# Patient Record
Sex: Female | Born: 1995 | Hispanic: No | Marital: Single | Smoking: Current some day smoker
Health system: Southern US, Community
[De-identification: ages and names within clinical notes are randomized; demographics above are authoritative.]

## PROBLEM LIST (undated history)

## (undated) ENCOUNTER — Inpatient Hospital Stay (HOSPITAL_COMMUNITY): Payer: Self-pay

## (undated) DIAGNOSIS — A6000 Herpesviral infection of urogenital system, unspecified: Secondary | ICD-10-CM

## (undated) DIAGNOSIS — B9689 Other specified bacterial agents as the cause of diseases classified elsewhere: Secondary | ICD-10-CM

## (undated) DIAGNOSIS — N760 Acute vaginitis: Secondary | ICD-10-CM

## (undated) DIAGNOSIS — F319 Bipolar disorder, unspecified: Secondary | ICD-10-CM

## (undated) DIAGNOSIS — F419 Anxiety disorder, unspecified: Secondary | ICD-10-CM

## (undated) DIAGNOSIS — A549 Gonococcal infection, unspecified: Secondary | ICD-10-CM

## (undated) DIAGNOSIS — A749 Chlamydial infection, unspecified: Secondary | ICD-10-CM

## (undated) DIAGNOSIS — D649 Anemia, unspecified: Secondary | ICD-10-CM

## (undated) DIAGNOSIS — F1910 Other psychoactive substance abuse, uncomplicated: Secondary | ICD-10-CM

## (undated) HISTORY — PX: TUBAL LIGATION: SHX77

## (undated) HISTORY — PX: MOUTH SURGERY: SHX715

---
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
## 2015-02-23 ENCOUNTER — Ambulatory Visit: Payer: No Typology Code available for payment source | Admitting: Certified Nurse Midwife

## 2015-02-24 ENCOUNTER — Encounter (HOSPITAL_COMMUNITY): Payer: Self-pay | Admitting: *Deleted

## 2015-02-24 ENCOUNTER — Emergency Department (HOSPITAL_COMMUNITY)
Admission: EM | Admit: 2015-02-24 | Discharge: 2015-02-24 | Disposition: A | Discharge status: Home / Self Care | Payer: BLUE CROSS/BLUE SHIELD | Attending: Emergency Medicine | Admitting: Emergency Medicine

## 2015-02-24 DIAGNOSIS — Z7952 Long term (current) use of systemic steroids: Secondary | ICD-10-CM | POA: Insufficient documentation

## 2015-02-24 DIAGNOSIS — F419 Anxiety disorder, unspecified: Secondary | ICD-10-CM | POA: Diagnosis not present

## 2015-02-24 DIAGNOSIS — A6000 Herpesviral infection of urogenital system, unspecified: Secondary | ICD-10-CM

## 2015-02-24 DIAGNOSIS — F17210 Nicotine dependence, cigarettes, uncomplicated: Secondary | ICD-10-CM | POA: Diagnosis not present

## 2015-02-24 DIAGNOSIS — R21 Rash and other nonspecific skin eruption: Secondary | ICD-10-CM | POA: Diagnosis present

## 2015-02-24 MED ORDER — ACYCLOVIR 400 MG PO TABS
400.0000 mg | ORAL_TABLET | Freq: Four times a day (QID) | ORAL | Status: DC
Start: 2015-02-24 — End: 2015-07-13

## 2015-02-24 MED ORDER — ACYCLOVIR 200 MG PO CAPS
400.0000 mg | ORAL_CAPSULE | Freq: Once | ORAL | Status: AC
  Administered 2015-02-24: 400 mg via ORAL
  Filled 2015-02-24 (×3): qty 2

## 2015-02-24 NOTE — Discharge Instructions (Signed)

## 2015-02-24 NOTE — ED Provider Notes (Signed)
CSN: [id number]     Arrival date & time 02/24/15  1227 History   First MD Initiated Contact with Patient 02/24/15 1425     Chief Complaint  Patient presents with  . SEXUALLY TRANSMITTED DISEASE  . Rash     (Consider location/radiation/quality/duration/timing/severity/associated sxs/prior Treatment) HPI   19 year old female with history of bipolar and anxiety who presents for evaluation of a rash. Patient report she was seen 3 days ago for evaluation vaginal discharge and rash. She was treated for suspected gonorrhea and Chlamydia. She was also diagnosed with bacterial vaginosis and has been taking Flagyl. Her rash was tested for herpes but the result is not been confirmed yet. She continued to endorse burning itching sensation at her vaginal rash and would like treatment for it. She has had prior history of gonorrhea and Chlamydia. She has had 3 separate sexual partners within the past 6 months using protection on occasion. Her last menstrual period was a week ago. She denies having fever, nausea vomiting, abdominal pain, dysuria. Her vaginal discharge has improved.    Past Medical History  Diagnosis Date  . Anxiety   . Bipolar 1 disorder (HCC)    History reviewed. No pertinent past surgical history. Family History  Problem Relation Age of Onset  . Diabetes Maternal Grandfather   . Heart disease Maternal Grandfather    Social History  Substance Use Topics  . Smoking status: Current Every Day Smoker    Types: Cigars  . Smokeless tobacco: Never Used  . Alcohol Use: Yes     Comment: occasionally   OB History    Gravida Para Term Preterm AB TAB SAB Ectopic Multiple Living       Review of Systems  All other systems reviewed and are negative.     Allergies  Review of patient's allergies indicates no known allergies.  Home Medications   Prior to Admission medications   Medication Sig Start Date End Date Taking? Authorizing Provider  cyclobenzaprine  (FLEXERIL) 10 MG tablet Take 1 tablet (10 mg total) by mouth 3 (three) times daily as needed for muscle spasms. 04/24/14   Mercedes Camprubi-Soms, PA-C  HYDROcodone-acetaminophen (NORCO) 5-325 MG per tablet Take 1 tablet by mouth every 6 (six) hours as needed for severe pain. 04/24/14   Mercedes Camprubi-Soms, PA-C  LORazepam (ATIVAN) 1 MG tablet Take 1 tablet (1 mg total) by mouth 3 (three) times daily as needed (anxiety / muscle spasm). 04/01/14   Gerhard Munch, MD  medroxyPROGESTERone (DEPO-PROVERA) 150 MG/ML injection Inject 1 mL (150 mg total) into the muscle every 3 (three) months. 02/28/14   Antionette Char, MD  naproxen (NAPROSYN) 500 MG tablet Take 1 tablet (500 mg total) by mouth 2 (two) times daily as needed for mild pain, moderate pain or headache (TAKE WITH MEALS.). 04/24/14   Mercedes Camprubi-Soms, PA-C  predniSONE (DELTASONE) 10 MG tablet Take 2 tablets (20 mg total) by mouth daily. 07/06/14   Earley Favor, NP   BP 145/99 mmHg  Pulse 92  Temp(Src) 98.7 F (37.1 C) (Oral)  Resp 18  SpO2 98%  LMP 02/17/2015 Physical Exam  Constitutional: She appears well-developed and well-nourished. No distress.  HENT:  Head: Atraumatic.  Eyes: Conjunctivae are normal.  Neck: Neck supple.  Abdominal: Soft. There is no tenderness.  Genitourinary:  Chaperone present during exam. A grouped vesicles noted to right lower external vaginal region consistent with genital herpes.  No inguinal lymphadenopathy or inguinal hernia noted.  Normal vaginal vault with mild vaginal discharge.  Closed cervical os.   Neurological: She is alert.  Skin: No rash noted.  Psychiatric: She has a normal mood and affect.  Nursing note and vitals reviewed.   ED Course  Procedures (including critical care time)  3:03 PM Pt with genital rash consistent with genital herpes.  Will treat with acyclovir.  Safe sex practice discussed. Pt also has been evaluated recently for STD therefore, additional STd testing not  indicated at this time.    MDM   Final diagnoses:  Genital herpes    BP 145/99 mmHg  Pulse 92  Temp(Src) 98.7 F (37.1 C) (Oral)  Resp 18  SpO2 98%  LMP 02/17/2015     Fayrene HelperBowie Roby Spalla, PA-C 02/24/15 1504  Alvira MondayErin Schlossman, MD 02/26/15 2245

## 2015-02-24 NOTE — ED Notes (Signed)
Pt reports recent treatment for std, still has rash and wants to be checked for herpes.

## 2015-03-24 ENCOUNTER — Emergency Department (HOSPITAL_COMMUNITY)
Admission: EM | Admit: 2015-03-24 | Discharge: 2015-03-24 | Disposition: A | Discharge status: Home / Self Care | Payer: BLUE CROSS/BLUE SHIELD | Attending: Emergency Medicine | Admitting: Emergency Medicine

## 2015-03-24 ENCOUNTER — Encounter (HOSPITAL_COMMUNITY): Payer: Self-pay | Admitting: Neurology

## 2015-03-24 DIAGNOSIS — Z3202 Encounter for pregnancy test, result negative: Secondary | ICD-10-CM | POA: Diagnosis not present

## 2015-03-24 DIAGNOSIS — F419 Anxiety disorder, unspecified: Secondary | ICD-10-CM | POA: Insufficient documentation

## 2015-03-24 DIAGNOSIS — N939 Abnormal uterine and vaginal bleeding, unspecified: Secondary | ICD-10-CM | POA: Insufficient documentation

## 2015-03-24 DIAGNOSIS — Z7952 Long term (current) use of systemic steroids: Secondary | ICD-10-CM | POA: Insufficient documentation

## 2015-03-24 DIAGNOSIS — Z79899 Other long term (current) drug therapy: Secondary | ICD-10-CM | POA: Diagnosis not present

## 2015-03-24 DIAGNOSIS — Z8619 Personal history of other infectious and parasitic diseases: Secondary | ICD-10-CM | POA: Diagnosis not present

## 2015-03-24 DIAGNOSIS — F319 Bipolar disorder, unspecified: Secondary | ICD-10-CM | POA: Diagnosis not present

## 2015-03-24 DIAGNOSIS — F17210 Nicotine dependence, cigarettes, uncomplicated: Secondary | ICD-10-CM | POA: Insufficient documentation

## 2015-03-24 LAB — CBC WITH DIFFERENTIAL/PLATELET
BASOS PCT: 0 %
Basophils Absolute: 0 10*3/uL (ref 0.0–0.1)
Eosinophils Absolute: 0.1 10*3/uL (ref 0.0–0.7)
Eosinophils Relative: 1 %
HEMATOCRIT: 39.1 % (ref 36.0–46.0)
Hemoglobin: 13.1 g/dL (ref 12.0–15.0)
LYMPHS PCT: 35 %
Lymphs Abs: 2.2 10*3/uL (ref 0.7–4.0)
MCH: 29.6 pg (ref 26.0–34.0)
MCHC: 33.5 g/dL (ref 30.0–36.0)
MCV: 88.3 fL (ref 78.0–100.0)
MONO ABS: 0.3 10*3/uL (ref 0.1–1.0)
Monocytes Relative: 4 %
NEUTROS PCT: 60 %
Neutro Abs: 3.7 10*3/uL (ref 1.7–7.7)
Platelets: 187 10*3/uL (ref 150–400)
RBC: 4.43 MIL/uL (ref 3.87–5.11)
RDW: 13.4 % (ref 11.5–15.5)
WBC: 6.2 10*3/uL (ref 4.0–10.5)

## 2015-03-24 LAB — I-STAT BETA HCG BLOOD, ED (MC, WL, AP ONLY): I-stat hCG, quantitative: <5 m[IU]/mL (ref <5)

## 2015-03-24 MED ORDER — IBUPROFEN 200 MG PO TABS
600.0000 mg | ORAL_TABLET | Freq: Once | ORAL | Status: DC
  Filled 2015-03-24: qty 1

## 2015-03-24 NOTE — ED Provider Notes (Signed)
CSN: [id number]     Arrival date & time 03/24/15  1605 History   First MD Initiated Contact with Patient 03/24/15 1616     Chief Complaint  Patient presents with  . Vaginal Bleeding     (Consider location/radiation/quality/duration/timing/severity/associated sxs/prior Treatment) HPI  Patient is a [age] year old female with past medical history significant for bipolar, anxiety, genital herpes, who presents to the emergency department with vaginal bleeding. That she has been on a form of birth control for the past 4 years, over the past year has been receiving Depo shots. She stopped using depo shots last November and has not had a menstrual period since that time. Reports vaginal bleeding started 1 month ago and has constant spotting since that time. Using approximately 1-2 tampons a day. Regular menstrual periods prior to contraceptive use. Recently treated for gonorrhea/Chlamydia and genital herpes. Reports lower abdominal cramping over the past month. Associated with nausea. Denies fevers, chills, vomiting, dysuria, urinary urgency, vaginal discharge, rash.  No family history of bleeding disorder. No prior surgeries. Denies chest pain, fatigue, shortness of breath, lightheadedness.   Past Medical History  Diagnosis Date  . Anxiety   . Bipolar 1 disorder (HCC)    History reviewed. No pertinent past surgical history. Family History  Problem Relation Age of Onset  . Diabetes Maternal Grandfather   . Heart disease Maternal Grandfather    Social History  Substance Use Topics  . Smoking status: Current Every Day Smoker    Types: Cigars  . Smokeless tobacco: Never Used  . Alcohol Use: Yes     Comment: occasionally   OB History    Gravida Para Term Preterm AB TAB SAB Ectopic Multiple Living   0 0 0 0 0 0 0 0 0 0      Review of Systems  Constitutional: Negative for fever.  HENT: Negative for congestion.   Respiratory: Negative for cough, chest tightness and shortness of breath.    Cardiovascular: Negative for chest pain.  Gastrointestinal: Negative for vomiting, abdominal pain and blood in stool.  Genitourinary: Positive for vaginal bleeding, menstrual problem and pelvic pain. Negative for dysuria, hematuria, flank pain, vaginal discharge and vaginal pain.  Musculoskeletal: Negative for back pain.  Skin: Negative for rash.  Neurological: Negative for dizziness, weakness and light-headedness.  Psychiatric/Behavioral: Negative for behavioral problems.      Allergies  Review of patient's allergies indicates no known allergies.  Home Medications   Prior to Admission medications   Medication Sig Start Date End Date Taking? Authorizing Provider  acyclovir (ZOVIRAX) 400 MG tablet Take 1 tablet (400 mg total) by mouth 4 (four) times daily. 02/24/15   Fayrene HelperBowie Tran, PA-C  cyclobenzaprine (FLEXERIL) 10 MG tablet Take 1 tablet (10 mg total) by mouth 3 (three) times daily as needed for muscle spasms. 04/24/14   Mercedes Camprubi-Soms, PA-C  HYDROcodone-acetaminophen (NORCO) 5-325 MG per tablet Take 1 tablet by mouth every 6 (six) hours as needed for severe pain. 04/24/14   Mercedes Camprubi-Soms, PA-C  LORazepam (ATIVAN) 1 MG tablet Take 1 tablet (1 mg total) by mouth 3 (three) times daily as needed (anxiety / muscle spasm). 04/01/14   Gerhard Munchobert Lockwood, MD  medroxyPROGESTERone (DEPO-PROVERA) 150 MG/ML injection Inject 1 mL (150 mg total) into the muscle every 3 (three) months. 02/28/14   Antionette CharLisa Jackson-Moore, MD  naproxen (NAPROSYN) 500 MG tablet Take 1 tablet (500 mg total) by mouth 2 (two) times daily as needed for mild pain, moderate pain or headache (TAKE WITH MEALS.). 04/24/14  Mercedes Camprubi-Soms, PA-C  predniSONE (DELTASONE) 10 MG tablet Take 2 tablets (20 mg total) by mouth daily. 07/06/14   Earley Favor, NP   BP 112/74 mmHg  Pulse 60  Temp(Src) 97.8 F (36.6 C) (Oral)  Resp 18  SpO2 100%  LMP 02/17/2015 Physical Exam  Constitutional: She is oriented to person, place,  and time. She appears well-developed and well-nourished. No distress.  HENT:  Head: Atraumatic.  Mouth/Throat: Oropharynx is clear and moist.  Eyes: Conjunctivae and EOM are normal.  Neck: Normal range of motion.  Cardiovascular: Normal rate, regular rhythm, normal heart sounds and intact distal pulses.   Pulmonary/Chest: Effort normal and breath sounds normal. No respiratory distress.  Abdominal: There is no tenderness. There is no rebound.  Genitourinary: Uterus normal. There is no rash on the right labia. There is no rash on the left labia. Cervix exhibits no motion tenderness and no discharge. Right adnexum displays no mass and no tenderness. Left adnexum displays no mass and no tenderness. There is bleeding in the vagina. No tenderness in the vagina. No foreign body around the vagina. No signs of injury around the vagina. No vaginal discharge found.  Musculoskeletal: Normal range of motion.  Neurological: She is alert and oriented to person, place, and time.  Skin: Skin is warm. No pallor.  Psychiatric: She has a normal mood and affect.    ED Course  Procedures (including critical care time) Labs Review Labs Reviewed  CBC WITH DIFFERENTIAL/PLATELET  I-STAT BETA HCG BLOOD, ED ([facility], [facility], [facility] ONLY)    Imaging Review No results found. I have personally reviewed and evaluated these images and lab results as part of my medical decision-making.   EKG Interpretation None      MDM   Final diagnoses:  Vaginal bleeding    Patient is a 19 year old female with past medical history significant for bipolar, anxiety, genital herpes, who presents to the emergency department with vaginal bleeding. No acute distress, not ill appearing. Afebrile, VSS. Exam as above, notable for benign abdominal exam, GU with scant blood in vagina vault, no foreign bodies or lacerations, no vaginal discharge, no CMT.    Hgb stable. Pregnancy test negative. Doubt coagulopathy, no family history, no other signs  of bleeding in the past. Patient with minimal amount of bleeding on exam. Given motrin for cramping. Discussed follow up with OB/GYN as an outpatient for further evaluation. Do not feel that the patient needs further imaging at this time. Most likely with dysfunctional uterine bleeding. Discussed strict return precautions to the emergency department. Patient expressed understanding, no questions or concerns at time of discharge.       Corena Herter, MD 03/25/15 1610  Linwood Dibbles, MD 03/25/15 [phone]

## 2015-03-24 NOTE — Discharge Instructions (Signed)
Take Motrin 600 mg every 6 hours as needed for abdominal cramping, this will also help with her vaginal bleeding. Call to schedule an appointment with gynecology on Monday morning. Return to the emergency department if bleeding becomes significantly worse.  Dysfunctional Uterine Bleeding Dysfunctional uterine bleeding is abnormal bleeding from the uterus. Dysfunctional uterine bleeding includes:  A period that comes earlier or later than usual.  A period that is lighter, heavier, or has blood clots.  Bleeding between periods.  Skipping one or more periods.  Bleeding after sexual intercourse.  Bleeding after menopause. HOME CARE INSTRUCTIONS  Pay attention to any changes in your symptoms. Follow these instructions to help with your condition: Eating  Eat well-balanced meals. Include foods that are high in iron, such as liver, meat, shellfish, green leafy vegetables, and eggs.  If you become constipated:  Drink plenty of water.  Eat fruits and vegetables that are high in water and fiber, such as spinach, carrots, raspberries, apples, and mango. Medicines  Take over-the-counter and prescription medicines only as told by your health care provider.  Do not change medicines without talking with your health care provider.  Aspirin or medicines that contain aspirin may make the bleeding worse. Do not take those medicines:  During the week before your period.  During your period.  If you were prescribed iron pills, take them as told by your health care provider. Iron pills help to replace iron that your body loses because of this condition. Activity  If you need to change your sanitary pad or tampon more than one time every 2 hours:  Lie in bed with your feet raised (elevated).  Place a cold pack on your lower abdomen.  Rest as much as possible until the bleeding stops or slows down.  Do not try to lose weight until the bleeding has stopped and your blood iron level is back  to normal. Other Instructions  For two months, write down:  When your period starts.  When your period ends.  When any abnormal bleeding occurs.  What problems you notice.  Keep all follow up visits as told by your health care provider. This is important. SEEK MEDICAL CARE IF:  You get light-headed or weak.  You have nausea and vomiting.  You cannot eat or drink without vomiting.  You feel dizzy or have diarrhea while you are taking medicines.  You are taking birth control pills or hormones, and you want to change them or stop taking them. SEEK IMMEDIATE MEDICAL CARE IF:  You develop a fever or chills.  You need to change your sanitary pad or tampon more than one time per hour.  Your bleeding becomes heavier, or your flow contains clots more often.  You develop pain in your abdomen.  You lose consciousness.  You develop a rash.   This information is not intended to replace advice given to you by your health care provider. Make sure you discuss any questions you have with your health care provider.   Document Released: 03/28/2000 Document Revised: 12/20/2014 Document Reviewed: 06/26/2014 Elsevier Interactive Patient Education [employer]2016 [employer]

## 2015-03-24 NOTE — ED Notes (Signed)
Pt comfortable with discharge and follow up instructions. Pt declines wheelchair, escorted to waiting area by this RN. No Prescriptions 

## 2015-03-24 NOTE — ED Notes (Signed)
Pt reports 1 month of vaginal bleeding. Reports dark red blood, small clots. Bleeding has been intermittent but is heavy now. Color has changed from brown to dark red. 1 month ago was dx with gonorrhea, herpes, bv.

## 2015-04-20 ENCOUNTER — Encounter: Payer: BLUE CROSS/BLUE SHIELD | Admitting: Obstetrics & Gynecology

## 2015-06-24 ENCOUNTER — Emergency Department (HOSPITAL_COMMUNITY)
Admission: EM | Admit: 2015-06-24 | Discharge: 2015-06-24 | Disposition: A | Discharge status: Home / Self Care | Payer: BLUE CROSS/BLUE SHIELD | Attending: Emergency Medicine | Admitting: Emergency Medicine

## 2015-06-24 ENCOUNTER — Encounter (HOSPITAL_COMMUNITY): Payer: Self-pay | Admitting: Emergency Medicine

## 2015-06-24 DIAGNOSIS — F419 Anxiety disorder, unspecified: Secondary | ICD-10-CM | POA: Diagnosis not present

## 2015-06-24 DIAGNOSIS — R1030 Lower abdominal pain, unspecified: Secondary | ICD-10-CM

## 2015-06-24 DIAGNOSIS — N949 Unspecified condition associated with female genital organs and menstrual cycle: Secondary | ICD-10-CM | POA: Insufficient documentation

## 2015-06-24 DIAGNOSIS — F319 Bipolar disorder, unspecified: Secondary | ICD-10-CM | POA: Insufficient documentation

## 2015-06-24 DIAGNOSIS — N946 Dysmenorrhea, unspecified: Secondary | ICD-10-CM

## 2015-06-24 DIAGNOSIS — N179 Acute kidney failure, unspecified: Secondary | ICD-10-CM | POA: Diagnosis not present

## 2015-06-24 DIAGNOSIS — N898 Other specified noninflammatory disorders of vagina: Secondary | ICD-10-CM | POA: Diagnosis not present

## 2015-06-24 DIAGNOSIS — Z79899 Other long term (current) drug therapy: Secondary | ICD-10-CM | POA: Insufficient documentation

## 2015-06-24 DIAGNOSIS — Z3202 Encounter for pregnancy test, result negative: Secondary | ICD-10-CM | POA: Diagnosis not present

## 2015-06-24 DIAGNOSIS — Z202 Contact with and (suspected) exposure to infections with a predominantly sexual mode of transmission: Secondary | ICD-10-CM | POA: Diagnosis not present

## 2015-06-24 DIAGNOSIS — N926 Irregular menstruation, unspecified: Secondary | ICD-10-CM | POA: Insufficient documentation

## 2015-06-24 DIAGNOSIS — Z793 Long term (current) use of hormonal contraceptives: Secondary | ICD-10-CM | POA: Diagnosis not present

## 2015-06-24 DIAGNOSIS — Z711 Person with feared health complaint in whom no diagnosis is made: Secondary | ICD-10-CM

## 2015-06-24 DIAGNOSIS — F17210 Nicotine dependence, cigarettes, uncomplicated: Secondary | ICD-10-CM | POA: Diagnosis not present

## 2015-06-24 DIAGNOSIS — R110 Nausea: Secondary | ICD-10-CM | POA: Diagnosis not present

## 2015-06-24 DIAGNOSIS — Z7251 High risk heterosexual behavior: Secondary | ICD-10-CM

## 2015-06-24 LAB — CBC
HCT: 42.3 % (ref 36.0–46.0)
Hemoglobin: 14.2 g/dL (ref 12.0–15.0)
MCH: 31.3 pg (ref 26.0–34.0)
MCHC: 33.6 g/dL (ref 30.0–36.0)
MCV: 93.2 fL (ref 78.0–100.0)
Platelets: 259 10*3/uL (ref 150–400)
RBC: 4.54 MIL/uL (ref 3.87–5.11)
RDW: 13.6 % (ref 11.5–15.5)
WBC: 8.9 10*3/uL (ref 4.0–10.5)

## 2015-06-24 LAB — COMPREHENSIVE METABOLIC PANEL
ALBUMIN: 4.6 g/dL (ref 3.5–5.0)
ALK PHOS: 68 U/L (ref 38–126)
ALT: 17 U/L (ref 14–54)
AST: 23 U/L (ref 15–41)
Anion gap: 15 (ref 5–15)
BILIRUBIN TOTAL: 0.6 mg/dL (ref 0.3–1.2)
BUN: 15 mg/dL (ref 6–20)
CALCIUM: 10.1 mg/dL (ref 8.9–10.3)
CO2: 22 mmol/L (ref 22–32)
Chloride: 103 mmol/L (ref 101–111)
Creatinine, Ser: 1.22 mg/dL — ABNORMAL HIGH (ref 0.44–1.00)
GFR calc Af Amer: >60 mL/min (ref >60)
GFR calc non Af Amer: >60 mL/min (ref >60)
GLUCOSE: 112 mg/dL — AB (ref 65–99)
Potassium: 3.9 mmol/L (ref 3.5–5.1)
Sodium: 140 mmol/L (ref 135–145)
TOTAL PROTEIN: 7.8 g/dL (ref 6.5–8.1)

## 2015-06-24 LAB — URINALYSIS, ROUTINE W REFLEX MICROSCOPIC
Bilirubin Urine: NEGATIVE
Glucose, UA: NEGATIVE mg/dL
Ketones, ur: NEGATIVE mg/dL
Nitrite: NEGATIVE
PH: 5.5 (ref 5.0–8.0)
Protein, ur: NEGATIVE mg/dL
Specific Gravity, Urine: 1.021 (ref 1.005–1.030)

## 2015-06-24 LAB — URINE MICROSCOPIC-ADD ON

## 2015-06-24 LAB — LIPASE, BLOOD: Lipase: 34 U/L (ref 11–51)

## 2015-06-24 LAB — I-STAT BETA HCG BLOOD, ED (MC, WL, AP ONLY): I-stat hCG, quantitative: <5 m[IU]/mL (ref <5)

## 2015-06-24 MED ORDER — ONDANSETRON HCL 8 MG PO TABS: 8.0000 mg | ORAL_TABLET | Freq: Three times a day (TID) | ORAL | Status: DC | PRN

## 2015-06-24 MED ORDER — CEFTRIAXONE SODIUM 250 MG IJ SOLR
250.0000 mg | Freq: Once | INTRAMUSCULAR | Status: AC
  Administered 2015-06-24: 250 mg via INTRAMUSCULAR
  Filled 2015-06-24: qty 250

## 2015-06-24 MED ORDER — LIDOCAINE HCL (PF) 1 % IJ SOLN
INTRAMUSCULAR | Status: AC
  Administered 2015-06-24: 5 mL
  Filled 2015-06-24: qty 5

## 2015-06-24 MED ORDER — METRONIDAZOLE 500 MG PO TABS
2000.0000 mg | ORAL_TABLET | Freq: Once | ORAL | Status: AC
  Administered 2015-06-24: 2000 mg via ORAL
  Filled 2015-06-24: qty 4

## 2015-06-24 MED ORDER — ONDANSETRON 4 MG PO TBDP
8.0000 mg | ORAL_TABLET | Freq: Once | ORAL | Status: AC
  Administered 2015-06-24: 8 mg via ORAL
  Filled 2015-06-24: qty 2

## 2015-06-24 MED ORDER — AZITHROMYCIN 250 MG PO TABS
1000.0000 mg | ORAL_TABLET | Freq: Once | ORAL | Status: AC
  Administered 2015-06-24: 1000 mg via ORAL
  Filled 2015-06-24: qty 4

## 2015-06-24 NOTE — ED Notes (Signed)
Pt. reports low abdominal pain with mild nausea and occasional diarrhea onset last week with mild vaginal discharge , denies fever or chills.

## 2015-06-24 NOTE — ED Provider Notes (Signed)
CSN: [id number]     Arrival date & time 06/24/15  0007 History   First MD Initiated Contact with Patient 06/24/15 [id number]     Chief Complaint  Patient presents with  . Abdominal Pain     (Consider location/radiation/quality/duration/timing/severity/associated sxs/prior Treatment) HPI Comments: Anita LeavensJulieanna M Sims is a 20 y.o. female with a PMHx of bipolar 1 and anxiety, who presents to the ED with complaints of 3 weeks of ongoing lower abdominal pain. She states that she went to the health department 3 weeks ago and was tested positive for chlamydia, received azithromycin only, but the symptoms have persisted. She describes the pain as 10/10 constant throbbing and stabbing in the lower abdomen, nonradiating, worse with lying down, and with no treatments tried prior to arrival. Associated symptoms include white malodorous vaginal discharge and nausea. She endorses alcohol use over the last 3 days, and endorses being sexually active with 294 female partners over the last year, all unprotected. Additionally she states that she is on her menstrual cycle, although she is not sure because she has very irregular menses, but states that she is having spotting and vaginal bleeding. She believes this could be from a menstrual cycle. She denies any fevers, chills, chest pain, shortness breath, vomiting, diarrhea, constipation, melena, hematochezia, obstipation, dysuria, hematuria, vaginal itching, genital lesions, numbness, tingling, or focal weakness. She denies any recent travel, sick contacts, suspicious food intake, or chronic NSAID use.  Patient is a 20 y.o. female presenting with abdominal pain. The history is provided by the patient. No language interpreter was used.  Abdominal Pain Pain location:  LLQ, RLQ and suprapubic Pain quality: stabbing and throbbing   Pain radiates to:  Does not radiate Pain severity:  Severe Onset quality:  Gradual Duration:  3 weeks Timing:  Constant Progression:   Unchanged Chronicity:  Recurrent Context: recent illness (tested +chlamydia)   Context: not recent travel, not sick contacts and not suspicious food intake   Relieved by:  None tried Worsened by:  Position changes Ineffective treatments:  None tried Associated symptoms: nausea, vaginal bleeding (on menses) and vaginal discharge   Associated symptoms: no chest pain, no chills, no constipation, no diarrhea, no dysuria, no fever, no flatus, no hematemesis, no hematochezia, no hematuria, no melena, no shortness of breath and no vomiting   Risk factors: alcohol abuse   Risk factors: no NSAID use     Past Medical History  Diagnosis Date  . Anxiety   . Bipolar 1 disorder (HCC)    History reviewed. No pertinent past surgical history. Family History  Problem Relation Age of Onset  . Diabetes Maternal Grandfather   . Heart disease Maternal Grandfather    Social History  Substance Use Topics  . Smoking status: Current Every Day Smoker    Types: Cigars  . Smokeless tobacco: Never Used  . Alcohol Use: Yes     Comment: occasionally   OB History    Gravida Para Term Preterm AB TAB SAB Ectopic Multiple Living   0 0 0 0 0 0 0 0 0 0      Review of Systems  Constitutional: Negative for fever and chills.  Respiratory: Negative for shortness of breath.   Cardiovascular: Negative for chest pain.  Gastrointestinal: Positive for nausea and abdominal pain. Negative for vomiting, diarrhea, constipation, blood in stool, melena, hematochezia, flatus and hematemesis.  Genitourinary: Positive for vaginal bleeding (on menses) and vaginal discharge. Negative for dysuria, hematuria, genital sores and vaginal pain.  Musculoskeletal: Negative for myalgias and  arthralgias.  Skin: Negative for color change.  Allergic/Immunologic: Negative for immunocompromised state.  Neurological: Negative for weakness and numbness.  Psychiatric/Behavioral: Negative for confusion.   10 Systems reviewed and are negative  for acute change except as noted in the HPI.    Allergies  Review of patient's allergies indicates no known allergies.  Home Medications   Prior to Admission medications   Medication Sig Start Date End Date Taking? Authorizing Provider  acyclovir (ZOVIRAX) 400 MG tablet Take 1 tablet (400 mg total) by mouth 4 (four) times daily. 02/24/15   Fayrene Helper, PA-C  cyclobenzaprine (FLEXERIL) 10 MG tablet Take 1 tablet (10 mg total) by mouth 3 (three) times daily as needed for muscle spasms. 04/24/14   Twisha Vanpelt Camprubi-Soms, PA-C  HYDROcodone-acetaminophen (NORCO) 5-325 MG per tablet Take 1 tablet by mouth every 6 (six) hours as needed for severe pain. 04/24/14   Cassius Cullinane Camprubi-Soms, PA-C  LORazepam (ATIVAN) 1 MG tablet Take 1 tablet (1 mg total) by mouth 3 (three) times daily as needed (anxiety / muscle spasm). 04/01/14   Gerhard Munch, MD  medroxyPROGESTERone (DEPO-PROVERA) 150 MG/ML injection Inject 1 mL (150 mg total) into the muscle every 3 (three) months. 02/28/14   Antionette Char, MD  naproxen (NAPROSYN) 500 MG tablet Take 1 tablet (500 mg total) by mouth 2 (two) times daily as needed for mild pain, moderate pain or headache (TAKE WITH MEALS.). 04/24/14   Talesha Ellithorpe Camprubi-Soms, PA-C  predniSONE (DELTASONE) 10 MG tablet Take 2 tablets (20 mg total) by mouth daily. 07/06/14   Earley Favor, NP   BP 120/71 mmHg  Pulse 73  Temp(Src) 97.8 F (36.6 C) (Oral)  Resp 18  Ht  (1.6 m)  Wt 58.627 kg  BMI 22.90 kg/m2  SpO2 100%  LMP 06/17/2015 (Approximate) Physical Exam  Constitutional: She is oriented to person, place, and time. Vital signs are normal. She appears well-developed and well-nourished.  Non-toxic appearance. No distress.  Afebrile, nontoxic, NAD  HENT:  Head: Normocephalic and atraumatic.  Mouth/Throat: Oropharynx is clear and moist and mucous membranes are normal.  Eyes: Conjunctivae and EOM are normal. Right eye exhibits no discharge. Left eye exhibits no discharge.   Neck: Normal range of motion. Neck supple.  Cardiovascular: Normal rate, regular rhythm, normal heart sounds and intact distal pulses.  Exam reveals no gallop and no friction rub.   No murmur heard. Pulmonary/Chest: Effort normal and breath sounds normal. No respiratory distress. She has no decreased breath sounds. She has no wheezes. She has no rhonchi. She has no rales.  Abdominal: Soft. Normal appearance and bowel sounds are normal. She exhibits no distension. There is tenderness (pubic symphysis). There is no rigidity, no rebound, no guarding, no CVA tenderness, no tenderness at McBurney's point and negative Murphy's sign.    Soft, nondistended, +BS throughout, with no focal tenderness in the abdomen but some mild tenderness over the pubic symphysis, no r/g/r, neg murphy's, neg mcburney's, no CVA TTP   Genitourinary:  Refused pelvic exam  Musculoskeletal: Normal range of motion.  Neurological: She is alert and oriented to person, place, and time. She has normal strength. No sensory deficit.  Skin: Skin is warm, dry and intact. No rash noted.  Psychiatric: She has a normal mood and affect.  Nursing note and vitals reviewed.   ED Course  Procedures (including critical care time) Labs Review Labs Reviewed  URINALYSIS, ROUTINE W REFLEX MICROSCOPIC (NOT AT [facility]) - Abnormal; Notable for the following:    Hgb urine dipstick  MODERATE (*)    Leukocytes, UA SMALL (*)    All other components within normal limits  COMPREHENSIVE METABOLIC PANEL - Abnormal; Notable for the following:    Glucose, Bld 112 (*)    Creatinine, Ser 1.22 (*)    All other components within normal limits  URINE MICROSCOPIC-ADD ON - Abnormal; Notable for the following:    Squamous Epithelial / LPF 6-30 (*)    Bacteria, UA MANY (*)    All other components within normal limits  URINE CULTURE  LIPASE, BLOOD  CBC  I-STAT BETA HCG BLOOD, ED ([facility], [facility], [facility] ONLY)    Imaging Review No results found. I have personally  reviewed and evaluated these images and lab results as part of my medical decision-making.   EKG Interpretation None      MDM   Final diagnoses:  Lower abdominal pain  Concern about STD in female without diagnosis  Unprotected sexual intercourse  Vaginal discharge  AKI (acute kidney injury) (HCC)  Nausea  Menstrual pain  Menstrual periods irregular    20 y.o. female here with ongoing lower abd pain and vaginal discharge x3wks, was tested at health dept and was +chlamydia, was treated with azithromycin only but symptoms persisted. She feels some nausea as well. On exam, very minimal tenderness over the pubic symphysis but none in the abdominal exam. Labs reveal: hcg neg, lipase WNL, CMP with minimally elevated Cr at 1.22, CBC WNL, U/A with moderate hgb (likely vaginal contaminant) with 6-30 squamous, 0-5 WBCs and many bacteria, likely vaginal contaminant. No urinary complaints, doubt UTI, but will send for culture and if it has significant growth for a single species then she would need to be started on treatment. Pt declined a pelvic exam, discussed that we could empirically treat for GC/CT/trich and she accepted this alternative. zofran given, and will give Rx to go home with this. Overall it seems that her symptoms are either STD related or could be menstrual cycle related since she has a menstrual cycle at this time. Doubt torsion or PID given how little tenderness she has in her abdominal exam. Discussed f/up with health dept for ongoing care/STD concerns. Safe sex discussed. I explained the diagnosis and have given explicit precautions to return to the ER including for any other new or worsening symptoms. The patient understands and accepts the medical plan as it's been dictated and I have answered their questions. Discharge instructions concerning home care and prescriptions have been given. The patient is STABLE and is discharged to home in good condition.    BP 113/98 mmHg  Pulse 68   Temp(Src) 97.8 F (36.6 C) (Oral)  Resp 18  Ht 5\' 3"  (1.6 m)  Wt 58.627 kg  BMI 22.90 kg/m2  SpO2 100%  LMP 06/17/2015 (Approximate)  Meds ordered this encounter  Medications  . ondansetron (ZOFRAN-ODT) disintegrating tablet 8 mg    Sig:   . azithromycin (ZITHROMAX) tablet 1,000 mg    Sig:    And  . cefTRIAXone (ROCEPHIN) injection 250 mg    Sig:     Order Specific Question:  Antibiotic Indication:    Answer:  STD   And  . metroNIDAZOLE (FLAGYL) tablet 2,000 mg    Sig:   . lidocaine (PF) (XYLOCAINE) 1 % injection    Sig:     Lasher, Robert   : cabinet override  . ondansetron (ZOFRAN) 8 MG tablet    Sig: Take 1 tablet (8 mg total) by mouth every 8 (eight) hours  as needed for nausea or vomiting.    Dispense:  10 tablet    Refill:  0    Order Specific Question:  Supervising Provider    Answer:  Evlyn Kanner, PA-C 06/24/15 0716  April Palumbo, MD 06/24/15 [phone]

## 2015-06-24 NOTE — Discharge Instructions (Signed)
You have been treated for gonorrhea, chlamydia, and trichomonas in the ER but the hospital will call you if lab is positive. DO NOT ENGAGE IN SEXUAL ACTIVITY FOR AT LEAST 10 DAYS, THIS WILL INVALIDATE YOUR TREATMENT HERE. DO NOT ENGAGE IN SEX UNTIL YOU'VE HAD YOUR PARTNERS TESTED AND TREATED.  [facility] for future STD concerns or screenings. This is the recommendation by the CDC for people with multiple sexual partners or history of STDs. Use tylenol and motrin as needed for pain. Use heating pad as needed for pain. Use zofran as needed for nausea. Stay well hydrated. Follow up with the health department in 1-2 weeks for recheck of symptoms. Return to the ER for changes or worsening symptoms.      Abdominal Pain, Adult Many things can cause belly (abdominal) pain. Most times, the belly pain is not dangerous. Many cases of belly pain can be watched and treated at home. HOME CARE   Do not take medicines that help you go poop (laxatives) unless told to by your doctor.  Only take medicine as told by your doctor.  Eat or drink as told by your doctor. Your doctor will tell you if you should be on a special diet. GET HELP IF:  You do not know what is causing your belly pain.  You have belly pain while you are sick to your stomach (nauseous) or have runny poop (diarrhea).  You have pain while you pee or poop.  Your belly pain wakes you up at night.  You have belly pain that gets worse or better when you eat.  You have belly pain that gets worse when you eat fatty foods.  You have a fever. GET HELP RIGHT AWAY IF:   The pain does not go away within 2 hours.  You keep throwing up (vomiting).  The pain changes and is only in the right or left part of the belly.  You have bloody or tarry looking poop. MAKE SURE YOU:   Understand these instructions.  Will watch your condition.  Will get help right away if you are not doing well or get  worse.   This information is not intended to replace advice given to you by your health care provider. Make sure you discuss any questions you have with your health care provider.   Document Released: 09/17/2007 Document Revised: 04/21/2014 Document Reviewed: 12/08/2012 Elsevier Interactive Patient Education 2016 [employer]  Abnormal Uterine Bleeding Abnormal uterine bleeding means bleeding from the vagina that is not your normal menstrual period. This can be:  Bleeding or spotting between periods.  Bleeding after sex (sexual intercourse).  Bleeding that is heavier or more than normal.  Periods that last longer than usual.  Bleeding after menopause. There are many problems that may cause this. Treatment will depend on the cause of the bleeding. Any kind of bleeding that is not normal should be reviewed by your doctor.  HOME CARE Watch your condition for any changes. These actions may lessen any discomfort you are having:  Do not use tampons or douches as told by your doctor.  Change your pads often. You should get regular pelvic exams and Pap tests. Keep all appointments for tests as told by your doctor. GET HELP IF:  You are bleeding for more than 1 week.  You feel dizzy at times. GET HELP RIGHT AWAY IF:   You pass out.  You have to change pads every 15 to 30 minutes.  You have belly pain.  You have a fever.  You become sweaty or weak.  You are passing large blood clots from the vagina.  You feel sick to your stomach (nauseous) and throw up (vomit). MAKE SURE YOU:  Understand these instructions.  Will watch your condition.  Will get help right away if you are not doing well or get worse.   This information is not intended to replace advice given to you by your health care provider. Make sure you discuss any questions you have with your health care provider.   Document Released: 01/26/2009 Document Revised: 04/05/2013 Document Reviewed:  10/28/2012 Elsevier Interactive Patient Education 2016 [employer] Inc[employer]  Dysmenorrhea Menstrual cramps (dysmenorrhea) are caused by the muscles of the uterus tightening (contracting) during a menstrual period. For some women, this discomfort is merely bothersome. For others, dysmenorrhea can be severe enough to interfere with everyday activities for a few days each month. Primary dysmenorrhea is menstrual cramps that last a couple of days when you start having menstrual periods or soon after. This often begins after a teenager starts having her period. As a woman gets older or has a baby, the cramps will usually lessen or disappear. Secondary dysmenorrhea begins later in life, lasts longer, and the pain may be stronger than primary dysmenorrhea. The pain may start before the period and last a few days after the period.  CAUSES  Dysmenorrhea is usually caused by an underlying problem, such as:  The tissue lining the uterus grows outside of the uterus in other areas of the body (endometriosis).  The endometrial tissue, which normally lines the uterus, is found in or grows into the muscular walls of the uterus (adenomyosis).  The pelvic blood vessels are engorged with blood just before the menstrual period (pelvic congestive syndrome).  Overgrowth of cells (polyps) in the lining of the uterus or cervix.  Falling down of the uterus (prolapse) because of loose or stretched ligaments.  Depression.  Bladder problems, infection, or inflammation.  Problems with the intestine, a tumor, or irritable bowel syndrome.  Cancer of the female organs or bladder.  A severely tipped uterus.  A very tight opening or closed cervix.  Noncancerous tumors of the uterus (fibroids).  Pelvic inflammatory disease (PID).  Pelvic scarring (adhesions) from a previous surgery.  Ovarian cyst.  An intrauterine device (IUD) used for birth control. RISK FACTORS You may be at greater risk of dysmenorrhea if:  You  are younger than age 18.  You started puberty early.  You have irregular or heavy bleeding.  You have never given birth.  You have a family history of this problem.  You are a smoker. SIGNS AND SYMPTOMS   Cramping or throbbing pain in your lower abdomen.  Headaches.  Lower back pain.  Nausea or vomiting.  Diarrhea.  Sweating or dizziness.  Loose stools. DIAGNOSIS  A diagnosis is based on your history, symptoms, physical exam, diagnostic tests, or procedures. Diagnostic tests or procedures may include:  Blood tests.  Ultrasonography.  An examination of the lining of the uterus (dilation and curettage, D&C).  An examination inside your abdomen or pelvis with a scope (laparoscopy).  X-rays.  CT scan.  MRI.  An examination inside the bladder with a scope (cystoscopy).  An examination inside the intestine or stomach with a scope (colonoscopy, gastroscopy). TREATMENT  Treatment depends on the cause of the dysmenorrhea. Treatment may include:  Pain medicine prescribed by your health care provider.  Birth control pills or an IUD with  progesterone hormone in it.  Hormone replacement therapy.  Nonsteroidal anti-inflammatory drugs (NSAIDs). These may help stop the production of prostaglandins.  Surgery to remove adhesions, endometriosis, ovarian cyst, or fibroids.  Removal of the uterus (hysterectomy).  Progesterone shots to stop the menstrual period.  Cutting the nerves on the sacrum that go to the female organs (presacral neurectomy).  Electric current to the sacral nerves (sacral nerve stimulation).  Antidepressant medicine.  Psychiatric therapy, counseling, or group therapy.  Exercise and physical therapy.  Meditation and yoga therapy.  Acupuncture. HOME CARE INSTRUCTIONS   Only take over-the-counter or prescription medicines as directed by your health care provider.  Place a heating pad or hot water bottle on your lower back or abdomen. Do not  sleep with the heating pad.  Use aerobic exercises, walking, swimming, biking, and other exercises to help lessen the cramping.  Massage to the lower back or abdomen may help.  Stop smoking.  Avoid alcohol and caffeine. SEEK MEDICAL CARE IF:   Your pain does not get better with medicine.  You have pain with sexual intercourse.  Your pain increases and is not controlled with medicines.  You have abnormal vaginal bleeding with your period.  You develop nausea or vomiting with your period that is not controlled with medicine. SEEK IMMEDIATE MEDICAL CARE IF:  You pass out.    This information is not intended to replace advice given to you by your health care provider. Make sure you discuss any questions you have with your health care provider.   Document Released: 03/31/2005 Document Revised: 12/01/2012 Document Reviewed: 09/16/2012 Elsevier Interactive Patient Education 2016 Else[employer] Inc.  Nausea, Adult Nausea is the feeling that you have an upset stomach or have to vomit. Nausea by itself is not likely a serious concern, but it may be an early sign of more serious medical problems. As nausea gets worse, it can lead to vomiting. If vomiting develops, there is the risk of dehydration.  CAUSES   Viral infections.  Food poisoning.  Medicines.  Pregnancy.  Motion sickness.  Migraine headaches.  Emotional distress.  Severe pain from any source.  Alcohol intoxication. HOME CARE INSTRUCTIONS  Get plenty of rest.  Ask your caregiver about specific rehydration instructions.  Eat small amounts of food and sip liquids more often.  Take all medicines as told by your caregiver. SEEK MEDICAL CARE IF:  You have not improved after 2 days, or you get worse.  You have a headache. SEEK IMMEDIATE MEDICAL CARE IF:   You have a fever.  You faint.  You keep vomiting or have blood in your vomit.  You are extremely weak or dehydrated.  You have dark or bloody  stools.  You have severe chest or abdominal pain. MAKE SURE YOU:  Understand these instructions.  Will watch your condition.  Will get help right away if you are not doing well or get worse.   This information is not intended to replace advice given to you by your health care provider. Make sure you discuss any questions you have with your health care provider.   Document Released: 05/08/2004 Document Revised: 04/21/2014 Document Reviewed: 12/11/2010 Elsevier Interactive Patient Education 2016 [employer].  Safe Sex Safe sex is about reducing the risk of giving or getting a sexually transmitted disease (STD). STDs are spread through sexual contact involving the genitals, mouth, or rectum. Some STDs can be cured and others cannot. Safe sex can also prevent unintended pregnancies.  WHAT ARE SOME SAFE SEX PRACTICES?  Limit your sexual activity to only one partner who is having sex with only you.  Talk to your partner about his or her past partners, past STDs, and drug use.  Use a condom every time you have sexual intercourse. This includes vaginal, oral, and anal sexual activity. Both females and males should wear condoms during oral sex. Only use latex or polyurethane condoms and water-based lubricants. Using petroleum-based lubricants or oils to lubricate a condom will weaken the condom and increase the chance that it will break. The condom should be in place from the beginning to the end of sexual activity. Wearing a condom reduces, but does not completely eliminate, your risk of getting or giving an STD. STDs can be spread by contact with infected body fluids and skin.  Get vaccinated for hepatitis B and HPV.  Avoid alcohol and recreational drugs, which can affect your judgment. You may forget to use a condom or participate in high-risk sex.  For females, avoid douching after sexual intercourse. Douching can spread an infection farther into the reproductive tract.  Check your body  for signs of sores, blisters, rashes, or unusual discharge. See your health care provider if you notice any of these signs.  Avoid sexual contact if you have symptoms of an infection or are being treated for an STD. If you or your partner has herpes, avoid sexual contact when blisters are present. Use condoms at all other times.  If you are at risk of being infected with HIV, it is recommended that you take a prescription medicine daily to prevent HIV infection. This is called pre-exposure prophylaxis (PrEP). You are considered at risk if:  You are a man who has sex with other men (MSM).  You are a heterosexual man or woman who is sexually active with more than one partner.  You take drugs by injection.  You are sexually active with a partner who has HIV.  Talk with your health care provider about whether you are at high risk of being infected with HIV. If you choose to begin PrEP, you should first be tested for HIV. You should then be tested every 3 months for as long as you are taking PrEP.  See your health care provider for regular screenings, exams, and tests for other STDs. Before having sex with a new partner, each of you should be screened for STDs and should talk about the results with each other. WHAT ARE THE BENEFITS OF SAFE SEX?   There is less chance of getting or giving an STD.  You can prevent unwanted or unintended pregnancies.  By discussing safe sex concerns with your partner, you may increase feelings of intimacy, comfort, trust, and honesty between the two of you.   This information is not intended to replace advice given to you by your health care provider. Make sure you discuss any questions you have with your health care provider.   Document Released: 05/08/2004 Document Revised: 04/21/2014 Document Reviewed: 09/22/2011 Elsevier Interactive Patient Education [employer]

## 2015-06-25 LAB — URINE CULTURE: Culture: NO GROWTH

## 2015-07-13 ENCOUNTER — Emergency Department (HOSPITAL_COMMUNITY)
Admission: EM | Admit: 2015-07-13 | Discharge: 2015-07-13 | Disposition: A | Discharge status: Home / Self Care | Payer: BLUE CROSS/BLUE SHIELD | Attending: Emergency Medicine | Admitting: Emergency Medicine

## 2015-07-13 ENCOUNTER — Encounter (HOSPITAL_COMMUNITY): Payer: Self-pay | Admitting: Vascular Surgery

## 2015-07-13 DIAGNOSIS — Z113 Encounter for screening for infections with a predominantly sexual mode of transmission: Secondary | ICD-10-CM | POA: Insufficient documentation

## 2015-07-13 DIAGNOSIS — Z79899 Other long term (current) drug therapy: Secondary | ICD-10-CM | POA: Diagnosis not present

## 2015-07-13 DIAGNOSIS — N898 Other specified noninflammatory disorders of vagina: Secondary | ICD-10-CM | POA: Diagnosis present

## 2015-07-13 DIAGNOSIS — Z8659 Personal history of other mental and behavioral disorders: Secondary | ICD-10-CM | POA: Insufficient documentation

## 2015-07-13 DIAGNOSIS — F17210 Nicotine dependence, cigarettes, uncomplicated: Secondary | ICD-10-CM | POA: Diagnosis not present

## 2015-07-13 LAB — URINALYSIS, ROUTINE W REFLEX MICROSCOPIC
BILIRUBIN URINE: NEGATIVE
Glucose, UA: NEGATIVE mg/dL
Hgb urine dipstick: NEGATIVE
KETONES UR: NEGATIVE mg/dL
NITRITE: NEGATIVE
PH: 6 (ref 5.0–8.0)
PROTEIN: NEGATIVE mg/dL
Specific Gravity, Urine: 1.021 (ref 1.005–1.030)

## 2015-07-13 LAB — WET PREP, GENITAL
CLUE CELLS WET PREP: NONE SEEN
Sperm: NONE SEEN
TRICH WET PREP: NONE SEEN
YEAST WET PREP: NONE SEEN

## 2015-07-13 LAB — URINE MICROSCOPIC-ADD ON

## 2015-07-13 LAB — POC URINE PREG, ED: PREG TEST UR: NEGATIVE

## 2015-07-13 MED ORDER — CEFTRIAXONE SODIUM 250 MG IJ SOLR
250.0000 mg | Freq: Once | INTRAMUSCULAR | Status: DC
  Filled 2015-07-13: qty 250

## 2015-07-13 MED ORDER — AZITHROMYCIN 250 MG PO TABS
1000.0000 mg | ORAL_TABLET | Freq: Once | ORAL | Status: AC
  Administered 2015-07-13: 1000 mg via ORAL
  Filled 2015-07-13: qty 4

## 2015-07-13 MED ORDER — STERILE WATER FOR INJECTION IJ SOLN
INTRAMUSCULAR | Status: AC
  Filled 2015-07-13: qty 10

## 2015-07-13 NOTE — Discharge Instructions (Signed)
We will call you if any of your tests come back positive. Return to the emergency room for worsening condition or new concerning symptoms. Follow up with your regular doctor. If you don't have a regular doctor use one of the numbers below to establish a primary care doctor.   Emergency Department Resource Guide 1) Find a Doctor and Pay Out of Pocket Although you won't have to find out who is covered by your insurance plan, it is a good idea to ask around and get recommendations. You will then need to call the office and see if the doctor you have chosen will accept you as a new patient and what types of options they offer for patients who are self-pay. Some doctors offer discounts or will set up payment plans for their patients who do not have insurance, but you will need to ask so you aren't surprised when you get to your appointment.  2) Contact Your Local Health Department Not all health departments have doctors that can see patients for sick visits, but many do, so it is worth a call to see if yours does. If you don't know where your local health department is, you can check in your phone book. The CDC also has a tool to help you locate your state's health department, and many state websites also have listings of all of their local health departments.  3) Find a Walk-in Clinic If your illness is not likely to be very severe or complicated, you may want to try a walk in clinic. These are popping up all over the country in pharmacies, drugstores, and shopping centers. They're usually staffed by nurse practitioners or physician assistants that have been trained to treat common illnesses and complaints. They're usually fairly quick and inexpensive. However, if you have serious medical issues or chronic medical problems, these are probably not your best option.  No Primary Care Doctor: - Call Health Connect at  [phone] - they can help you locate a primary care doctor that  accepts your insurance,  provides certain services, etc. - Physician Referral [phone]  Emergency Department Resource Guide 1) Find a Doctor and Pay Out of Pocket Although you won't have to find out who is covered by your insurance plan, it is a good idea to ask around and get recommendations. You will then need to call the office and see if the doctor you have chosen will accept you as a new patient and what types of options they offer for patients who are self-pay. Some doctors offer discounts or will set up payment plans for their patients who do not have insurance, but you will need to ask so you aren't surprised when you get to your appointment.  2) Contact Your Local Health Department Not all health departments have doctors that can see patients for sick visits, but many do, so it is worth a call to see if yours does. If you don't know where your local health department is, you can check in your phone book. The CDC also has a tool to help you locate your state's health department, and many state websites also have listings of all of their local health departments.  3) Find a Walk-in Clinic If your illness is not likely to be very severe or complicated, you may want to try a walk in clinic. These are popping up all over the country in pharmacies, drugstores, and shopping centers. They're usually staffed by nurse practitioners or physician assistants that have been trained to  treat common illnesses and complaints. They're usually fairly quick and inexpensive. However, if you have serious medical issues or chronic medical problems, these are probably not your best option.  No Primary Care Doctor: - Call Health Connect at  [phone] - they can help you locate a primary care doctor that  accepts your insurance, provides certain services, etc. - Physician Referral Service- [phone]  Chronic Pain Problems: Organization         Address  Phone   Notes  [facility]  [phone]  Patients need to be referred by their primary care doctor.   Medication Assistance: Organization         Address  Phone   Notes  [facility] [address] [place], [place] [address] [phone]) [phone] --Must be a resident of [facility] [place] -- Must have NO insurance coverage whatsoever (no Medicaid/ Medicare, etc.) -- The pt. MUST have a primary care doctor that directs their care regularly and follows them in the community   Med[facility]ist  [phone]   [employer]  [phone]    Agencies that provide inexpensive medical care: Organization         Address  Phone   Notes  [facility]  [phone]) [phone]   [facility]    [phone]) [phone]   [facility] [address] [place], [place] [address] [phone]   [facility] [address], [place] [phone]   [facility]    [phone]   [facility]    [phone]   [facility]  [address], [place] Phone:  [phone]) [phone], Fax:  [phone] Hours of Operation:  9 am - 6 pm, M-F.  Also accepts Medicaid/Medicare and self-pay.  [facility]  [address], [place] Phone: [phone], Fax: [phone]) [phone]. Hours of Operation:  8:30 am - 5:30 pm, M-F.  Also accepts Medicaid and self-pay.  [facility] [place] [address], [place] Phone: [phone]) [phone]   [facility] [address], [place], [place] [phone], Ext. [phone] Mondays & Thursdays: 7-9 AM.  First 15 patients are seen on a first come, first serve basis.    Medicaid-accepting [facility] Providers:  Organization         Address  Phone   Notes  [facility] [address], [place] [phone] Also accepts self-pay patients.  [facility] [address], [place]  [phone])  [phone]   [facility] [address], [place] [phone]   [facility] [address] [place] [address], [place] [phone]   Renaye RakersVeita Bland [address], [place]   [phone] Only accepts [place] patients after they have their name applied to their card.   Self-Pay (no insurance) in [facility] [place]:  Organization         Address  Phone   Notes  Sickle Cell Patients, [facility] [address], [place] [phone]   [facility] [address], [place] [phone]   [facility] [place]  [address], [place] [phone]   [facility]/Dr. Osei-Bonsu  [address] [place] [address], [place] or [phone], [place] [phone] Phone number for both [facility] and [place] locations is the same.  [facility] [address], [place] [phone]   [facility] [place] [address] [place] [address], [place] or [address] [phone]   [facility] [address], [place] [phone], phone; [phone]) [phone], fax Sees patients 1st and 3rd Saturday of every month.  Must not qualify for public or private insurance (i.e. Medicaid, Medicare, [place] Health Choice, Veterans' Benefits)  Household income should be no more than 200% of the poverty level The clinic cannot treat you if you are pregnant or think you are pregnant  Sexually transmitted diseases are not treated at the clinic.

## 2015-07-13 NOTE — ED Notes (Signed)
Pt reports to the ED for eval of vaginal irritation, itching, and a white non-odorous discharge x 4 days. Has not tried any OTC treatments. Denies any abd pain, N/V/D, fevers, or chills. Reports some urinary frequency but denies any dysuria or urgency. Pt A&Ox4, resp e/u, and skin warm and dry.

## 2015-07-13 NOTE — ED Provider Notes (Signed)
CSN: [id number]     Arrival date & time 07/13/15  4098 History   First MD Initiated Contact with Patient 07/13/15 1004     Chief Complaint  Patient presents with  . Vaginitis    HPI  Anita LUEDKE is an 20 y.o. female who presents to the ED for evaluation of vaginal discharge. She was states that she has had intermittent thick, white colored vaginal discharge for the past four days. Denies vaginal bleeding. Denies new rashes/lesions. Denies abdominal pain, n/v/d, dysuria, urinary frequency/urgency. States she is sexually active with one partner currently; no protection used. She states her partner is asymptomatic. Of note, pt was seen in the ED on 06/24/15 and treated empirically with rocephin, azithromycin, and flagyl.   Past Medical History  Diagnosis Date  . Anxiety   . Bipolar 1 disorder (HCC)    History reviewed. No pertinent past surgical history. Family History  Problem Relation Age of Onset  . Diabetes Maternal Grandfather   . Heart disease Maternal Grandfather    Social History  Substance Use Topics  . Smoking status: Current Every Day Smoker    Types: Cigars  . Smokeless tobacco: Never Used  . Alcohol Use: Yes     Comment: occasionally   OB History    Gravida Para Term Preterm AB TAB SAB Ectopic Multiple Living       Review of Systems  Constitutional: Negative for fever and chills.  Gastrointestinal: Negative for nausea, vomiting, abdominal pain and diarrhea.  Genitourinary: Positive for vaginal discharge. Negative for dysuria, urgency, frequency, vaginal bleeding, vaginal pain and pelvic pain.  Skin: Negative for rash.      Allergies  Review of patient's allergies indicates no known allergies.  Home Medications   Prior to Admission medications   Medication Sig Start Date End Date Taking? Authorizing Provider  acyclovir (ZOVIRAX) 400 MG tablet Take 1 tablet (400 mg total) by mouth 4 (four) times daily. Patient not taking: Reported  on 06/24/2015 02/24/15   Fayrene Helper, PA-C  cyclobenzaprine (FLEXERIL) 10 MG tablet Take 1 tablet (10 mg total) by mouth 3 (three) times daily as needed for muscle spasms. Patient not taking: Reported on 06/24/2015 04/24/14   Mercedes Camprubi-Soms, PA-C  HYDROcodone-acetaminophen (NORCO) 5-325 MG per tablet Take 1 tablet by mouth every 6 (six) hours as needed for severe pain. Patient not taking: Reported on 06/24/2015 04/24/14   Mercedes Camprubi-Soms, PA-C  LORazepam (ATIVAN) 1 MG tablet Take 1 tablet (1 mg total) by mouth 3 (three) times daily as needed (anxiety / muscle spasm). Patient not taking: Reported on 06/24/2015 04/01/14   Gerhard Munch, MD  medroxyPROGESTERone (DEPO-PROVERA) 150 MG/ML injection Inject 1 mL (150 mg total) into the muscle every 3 (three) months. Patient not taking: Reported on 06/24/2015 02/28/14   Antionette Char, MD  naproxen (NAPROSYN) 500 MG tablet Take 1 tablet (500 mg total) by mouth 2 (two) times daily as needed for mild pain, moderate pain or headache (TAKE WITH MEALS.). Patient not taking: Reported on 06/24/2015 04/24/14   Mercedes Camprubi-Soms, PA-C  ondansetron (ZOFRAN) 8 MG tablet Take 1 tablet (8 mg total) by mouth every 8 (eight) hours as needed for nausea or vomiting. 06/24/15   Mercedes Camprubi-Soms, PA-C  predniSONE (DELTASONE) 10 MG tablet Take 2 tablets (20 mg total) by mouth daily. Patient not taking: Reported on 06/24/2015 07/06/14   Earley Favor, NP  valACYclovir (VALTREX) 1000 MG tablet Take 1,000 mg by  mouth daily.    Historical Provider, MD   BP 126/81 mmHg  Pulse 73  Temp(Src) 97.8 F (36.6 C) (Oral)  SpO2 98%  LMP 06/17/2015 (Approximate) Physical Exam  Constitutional: She is oriented to person, place, and time. No distress.  HENT:  Head: Atraumatic.  Right Ear: External ear normal.  Left Ear: External ear normal.  Nose: Nose normal.  Eyes: Conjunctivae are normal. No scleral icterus.  Neck: Normal range of motion. Neck supple.   Cardiovascular: Normal rate and regular rhythm.   Pulmonary/Chest: Effort normal. No respiratory distress. She exhibits no tenderness.  Abdominal: Soft. Bowel sounds are normal. She exhibits no distension. There is no tenderness. There is no rebound and no guarding.  No CVA tenderness  Genitourinary:  Vault with thick, somewhat clumpy white-yellow-green discharge. Cervix nonfriable. No masses or lesions. No CMT or adnexal tenderness.  No external rash or lesions  Neurological: She is alert and oriented to person, place, and time.  Skin: Skin is warm and dry. She is not diaphoretic.  Psychiatric: She has a normal mood and affect. Her behavior is normal.  Nursing note and vitals reviewed.   ED Course  Procedures (including critical care time) Labs Review Labs Reviewed  WET PREP, GENITAL - Abnormal; Notable for the following:    WBC, Wet Prep HPF POC MANY (*)    All other components within normal limits  URINALYSIS, ROUTINE W REFLEX MICROSCOPIC (NOT AT [facility]) - Abnormal; Notable for the following:    APPearance HAZY (*)    Leukocytes, UA SMALL (*)    All other components within normal limits  URINE MICROSCOPIC-ADD ON - Abnormal; Notable for the following:    Squamous Epithelial / LPF 0-5 (*)    Bacteria, UA FEW (*)    All other components within normal limits  URINE CULTURE  POC URINE PREG, ED  GC/CHLAMYDIA PROBE AMP ([place]) NOT AT [facility]    Imaging Review No results found. I have personally reviewed and evaluated these images and lab results as part of my medical decision-making.   EKG Interpretation None      MDM   Final diagnoses:  Vaginal discharge  Encounter for screening examination for sexually transmitted disease    UA with 0-05 WBC, 0-5 squamous epithelial, few bacteria. Since pt asymptomatic will send urine for culture, will hold off on treating as UTI. Wet prep does show many WBC. I discussed findings with pt. She now reveals that although she was  treated recently for STI she is unsure if her partner has been treated. She states that her partner says that he was treated but she is not sure if he actually went and she would like abx again. Rocephin and azithromycin ordered. Pt declined HIV/RPR testing today.  Pt declined Rocephin shot today. I discussed with her that pending GC/chlamydia results she might need to return to ED or go to the health department for rocephin shot. She verbalized her agreement and understanding. I strongly encouraged sexual abstinence for at least one week but also until her partner has been verified for treatment as well. Resource guide given to establish PCP for f/u. ER return precautions given.    Carlene CoriaSerena Y Ellamay Fors, PA-C 07/13/15 1133  Nelva Nayobert Beaton, MD 07/14/15 [phone]

## 2015-07-14 LAB — URINE CULTURE

## 2015-07-16 LAB — GC/CHLAMYDIA PROBE AMP (~~LOC~~) NOT AT ARMC
Chlamydia: NEGATIVE
Neisseria Gonorrhea: NEGATIVE

## 2015-08-03 ENCOUNTER — Inpatient Hospital Stay (HOSPITAL_COMMUNITY)
Admission: AD | Admit: 2015-08-03 | Discharge: 2015-08-03 | Disposition: A | Discharge status: Home / Self Care | Payer: BLUE CROSS/BLUE SHIELD | Source: Ambulatory Visit | Attending: Obstetrics & Gynecology | Admitting: Obstetrics & Gynecology

## 2015-08-03 ENCOUNTER — Ambulatory Visit (INDEPENDENT_AMBULATORY_CARE_PROVIDER_SITE_OTHER): Payer: BLUE CROSS/BLUE SHIELD

## 2015-08-03 ENCOUNTER — Encounter (HOSPITAL_COMMUNITY): Payer: Self-pay | Admitting: *Deleted

## 2015-08-03 VITALS — Wt 133.3 lb

## 2015-08-03 DIAGNOSIS — F419 Anxiety disorder, unspecified: Secondary | ICD-10-CM | POA: Insufficient documentation

## 2015-08-03 DIAGNOSIS — R109 Unspecified abdominal pain: Secondary | ICD-10-CM | POA: Diagnosis present

## 2015-08-03 DIAGNOSIS — Z87891 Personal history of nicotine dependence: Secondary | ICD-10-CM | POA: Insufficient documentation

## 2015-08-03 DIAGNOSIS — F319 Bipolar disorder, unspecified: Secondary | ICD-10-CM | POA: Insufficient documentation

## 2015-08-03 DIAGNOSIS — Z3202 Encounter for pregnancy test, result negative: Secondary | ICD-10-CM

## 2015-08-03 DIAGNOSIS — N911 Secondary amenorrhea: Secondary | ICD-10-CM

## 2015-08-03 DIAGNOSIS — R102 Pelvic and perineal pain: Secondary | ICD-10-CM | POA: Insufficient documentation

## 2015-08-03 HISTORY — DX: Gonococcal infection, unspecified: A54.9

## 2015-08-03 HISTORY — DX: Herpesviral infection of urogenital system, unspecified: A60.00

## 2015-08-03 HISTORY — DX: Anemia, unspecified: D64.9

## 2015-08-03 HISTORY — DX: Chlamydial infection, unspecified: A74.9

## 2015-08-03 LAB — URINALYSIS, ROUTINE W REFLEX MICROSCOPIC
BILIRUBIN URINE: NEGATIVE
Glucose, UA: NEGATIVE mg/dL
HGB URINE DIPSTICK: NEGATIVE
Ketones, ur: NEGATIVE mg/dL
Nitrite: NEGATIVE
PROTEIN: NEGATIVE mg/dL
Specific Gravity, Urine: 1.01 (ref 1.005–1.030)
pH: 5.5 (ref 5.0–8.0)

## 2015-08-03 LAB — URINE MICROSCOPIC-ADD ON

## 2015-08-03 NOTE — MAU Note (Addendum)
Been having cramps in lower abd.  Thought she had a yeast infection. Has been to ED and health dept twice. Vag swabs were all neg, no UTI. Period is late. preg tests have been neg.  Came up from clinic, just had a neg test there

## 2015-08-03 NOTE — MAU Provider Note (Signed)
Chief Complaint:  Abdominal Pain   First Provider Initiated Contact with Patient 08/03/15 1003     HPI Anita Sims is a 20 y.o. G0P0000 who presents to maternity admissions reporting lower abdominal cramping off and on for 2 months.  Has had STD testing as well as empiric treatment. States has not had sex since treated.  Had vaginal irritation but no longer has it.  Has burning with urination, but none today.   Period is late this month, due in March.  Had negative UPT in clinic today.  She reports no vaginal bleeding, vaginal itching/burning, urinary symptoms, h/a, dizziness, n/v, or fever/chills.    Lots of concerns about HSV.. Was diagnosed after having a lesion.  Is worried about which of her partners she got it from. Does not really understand the nature of HSV, though she saw a NP in the STD clinic at the [facility]. States uses condoms with sex, though not all the time.  Does not want any birth control "because Depo and Nexplanon messed me up".  Does not want to be pregnant either.  Does not have a family doctor.  RN Note: Been having cramps in lower abd. Thought she had a yeast infection. Has been to ED and health dept twice. Vag swabs were all neg, no UTI. Period is late. preg tests have been neg. Came up from clinic, just had a neg test there           Past Medical History: Past Medical History  Diagnosis Date  . Anxiety   . Bipolar 1 disorder (HCC)   . Genital herpes   . Anemia   . Gonorrhea   . Chlamydia     Past obstetric history: OB History  Gravida Para Term Preterm AB SAB TAB Ectopic Multiple Living         Past Surgical History: Past Surgical History  Procedure Laterality Date  . Mouth surgery    . No past surgeries      Family History: Family History  Problem Relation Age of Onset  . Diabetes Maternal Grandfather     type II  . Cancer Maternal Grandfather   . Heart disease Maternal Grandmother   . Heart disease  Mother     Social History: Social History  Substance Use Topics  . Smoking status: Former Smoker    Types: Cigars  . Smokeless tobacco: Never Used     Comment: quit a couple wks ago  . Alcohol Use: Yes     Comment: wkly- used to drink all the time    Allergies: No Known Allergies  Meds:  Prescriptions prior to admission  Medication Sig Dispense Refill Last Dose  . valACYclovir (VALTREX) 1000 MG tablet Take 1,000 mg by mouth daily.   08/02/2015 at Unknown time  . ondansetron (ZOFRAN) 8 MG tablet Take 1 tablet (8 mg total) by mouth every 8 (eight) hours as needed for nausea or vomiting. (Patient not taking: Reported on 07/13/2015) 10 tablet 0     I have reviewed patient's Past Medical Hx, Surgical Hx, Family Hx, Social Hx, medications and allergies.  ROS:  Review of Systems  Constitutional: Negative for fever, chills and fatigue.  Gastrointestinal: Negative for nausea, vomiting, diarrhea and constipation.  Genitourinary: Positive for dysuria, menstrual problem and pelvic pain. Negative for frequency, hematuria, flank pain, vaginal bleeding, vaginal discharge, difficulty urinating and vaginal pain.  Musculoskeletal: Negative for back pain.   Other  systems negative  Physical Exam  Patient Vitals for the past 24 hrs:  BP Temp Temp src Pulse Resp Height Weight  08/03/15 0953 119/70 mmHg 97.6 F (36.4 C) Oral 79 16 5\' 2"  (1.575 m) 134 lb (60.782 kg)   Constitutional: Well-developed, well-nourished female in no acute distress.  Cardiovascular: normal rate and rhythm, no ectopy audible, S1 & S2 heard, no murmur Respiratory: normal effort, no distress. Lungs CTAB with no wheezes or crackles GI: Abd soft, non-tender.  Nondistended.  No rebound, No guarding.  Bowel Sounds audible  MS: Extremities nontender, no edema, normal ROM Neurologic: Alert and oriented x 4.   Grossly nonfocal. GU: Neg CVAT. Skin:  Warm and Dry Psych:  Affect appropriate.  PELVIC EXAM: No perineal lesions.   Cervix firm, anterior, neg CMT, uterus mildly tender, nonenlarged, adnexa without tenderness, enlargement, or mass    Labs: Results for orders placed or performed during the hospital encounter of 08/03/15 (from the past 24 hour(s))  Urinalysis, Routine w reflex microscopic (not at [facility])     Status: Abnormal   Collection Time: 08/03/15  9:55 AM  Result Value Ref Range   Color, Urine YELLOW YELLOW   APPearance CLEAR CLEAR   Specific Gravity, Urine 1.010 1.005 - 1.030   pH 5.5 5.0 - 8.0   Glucose, UA NEGATIVE NEGATIVE mg/dL   Hgb urine dipstick NEGATIVE NEGATIVE   Bilirubin Urine NEGATIVE NEGATIVE   Ketones, ur NEGATIVE NEGATIVE mg/dL   Protein, ur NEGATIVE NEGATIVE mg/dL   Nitrite NEGATIVE NEGATIVE   Leukocytes, UA TRACE (A) NEGATIVE  Urine microscopic-add on     Status: Abnormal   Collection Time: 08/03/15  9:55 AM  Result Value Ref Range   Squamous Epithelial / LPF 0-5 (A) NONE SEEN   WBC, UA 0-5 0 - 5 WBC/hpf   RBC / HPF 0-5 0 - 5 RBC/hpf   Bacteria, UA FEW (A) NONE SEEN   Urine-Other MUCOUS PRESENT     Imaging:  No results found.  MAU Course/MDM: I have ordered labs as follows: UA, recently had CBC, UPT and STD testing Imaging ordered: Outpatient US Results reviewed.     Pt stable at time of discharge.  Assessment: Pelvic pain Secondary Amenorrhea Known HSV history  Plan: Discharge home Recommend start care with a Family Doctor.  Explained pelvic pain is not always related to GYN issues. Can be Urinary or Bowel issues. Needs a Family MD to address whole body to follow her long term as an outpatient Extensive information regarding HSV, transmission, and testing reviewed with patient.  Ordered outpatient pelvic US to rule out ovarian cysts, etc. PRN follow up in clinic if they find something on US    Medication List    ASK your doctor about these medications        ondansetron 8 MG tablet  Commonly known as:  ZOFRAN  Take 1 tablet (8 mg total) by mouth every  8 (eight) hours as needed for nausea or vomiting.     valACYclovir 1000 MG tablet  Commonly known as:  VALTREX  Take 1,000 mg by mouth daily.       Encouraged to return here or to other Urgent Care/ED if she develops worsening of symptoms, increase in pain, fever, or other concerning symptoms.   Wynelle BourgeoisMarie Renesmae Donahey CNM, MSN Certified Nurse-Midwife 08/03/2015 10:43 AM

## 2015-08-03 NOTE — Progress Notes (Signed)
Pt presents today for a pregnancy test. She is currently not pregnant at this time. Pt stated something else is going on and requesting a appointment to be seen by a provider. Pt was instructed to set appointment up with our office to discuss in further detail.

## 2015-08-03 NOTE — Discharge Instructions (Signed)
Abdominal Pain, Adult Many things can cause abdominal pain. Usually, abdominal pain is not caused by a disease and will improve without treatment. It can often be observed and treated at home. Your health care provider will do a physical exam and possibly order blood tests and X-rays to help determine the seriousness of your pain. However, in many cases, more time must pass before a clear cause of the pain can be found. Before that point, your health care provider may not know if you need more testing or further treatment. HOME CARE INSTRUCTIONS Monitor your abdominal pain for any changes. The following actions may help to alleviate any discomfort you are experiencing:  Only take over-the-counter or prescription medicines as directed by your health care provider.  Do not take laxatives unless directed to do so by your health care provider.  Try a clear liquid diet (broth, tea, or water) as directed by your health care provider. Slowly move to a bland diet as tolerated. SEEK MEDICAL CARE IF:  You have unexplained abdominal pain.  You have abdominal pain associated with nausea or diarrhea.  You have pain when you urinate or have a bowel movement.  You experience abdominal pain that wakes you in the night.  You have abdominal pain that is worsened or improved by eating food.  You have abdominal pain that is worsened with eating fatty foods.  You have a fever. SEEK IMMEDIATE MEDICAL CARE IF:  Your pain does not go away within 2 hours.  You keep throwing up (vomiting).  Your pain is felt only in portions of the abdomen, such as the right side or the left lower portion of the abdomen.  You pass bloody or black tarry stools. MAKE SURE YOU:  Understand these instructions.  Will watch your condition.  Will get help right away if you are not doing well or get worse.   This information is not intended to replace advice given to you by your health care provider. Make sure you discuss  any questions you have with your health care provider.   Document Released: 01/08/2005 Document Revised: 12/20/2014 Document Reviewed: 12/08/2012 Elsevier Interactive Patient Education 2016 Elsevier Inc. Genital Herpes Genital herpes is a common sexually transmitted infection (STI) that is caused by a virus. The virus is spread from person to person through sexual contact. Infection can cause itching, blisters, and sores in the genital area or rectal area. This is called an outbreak. It affects both men and women. Genital herpes is particularly concerning for pregnant women because the virus can be passed to the baby during delivery and cause serious problems. Genital herpes is also a concern for people with a weakened defense (immune) system. Symptoms of genital herpes may last several days and then go away. However, the virus remains in your body, so you may have more outbreaks of symptoms in the future. The time between outbreaks varies and can be months or years. CAUSES Genital herpes is caused by a virus called herpes simplex virus (HSV) type 2 or HSV type 1. These viruses are contagious and are most often spread through sexual contact with an infected person. Sexual contact includes vaginal, anal, and oral sex. RISK FACTORS Risk factors for genital herpes include:  Being sexually active with multiple partners.  Having unprotected sex. SIGNS AND SYMPTOMS Symptoms may include:  Pain and itching in the genital area or rectal area.  Small red bumps that turn into blisters and then turn into sores.  Flu-like symptoms, including:  Fever.  Body aches.  Painful urination.  Vaginal discharge. DIAGNOSIS Genital herpes may be diagnosed by:  Physical exam.  Blood test.  Fluid culture test from an open sore. TREATMENT There is no cure for genital herpes. Oral antiviral medicines may be used to speed up healing and to help prevent the return of symptoms. These medicines can also  help to reduce the spread of the virus to sexual partners. HOME CARE INSTRUCTIONS  Keep the affected areas dry and clean.  Take medicines only as directed by your health care provider.  Do not have sexual contact during active infections. Genital herpes is contagious.  Practice safe sex. Latex condoms and female condoms may help to prevent the spread of the herpes virus.  Avoid rubbing or touching the blisters and sores. If you do touch the blister or sores:  Wash your hands thoroughly.  Do not touch your eyes afterward.  If you become pregnant, tell your health care provider if you have had genital herpes.  Keep all follow-up visits as directed by your health care provider. This is important. PREVENTION  Use condoms. Although anyone can contract genital herpes during sexual contact even with the use of a condom, a condom can provide some protection.  Avoid having multiple sexual partners.  Talk to your sexual partner about any symptoms and past history that either of you may have.  Get tested before you have sex. Ask your partner to do the same.  Recognize the symptoms of genital herpes. Do not have sexual contact if you notice these symptoms. SEEK MEDICAL CARE IF:  Your symptoms are not improving with medicine.  Your symptoms return.  You have new symptoms.  You have a fever.  You have abdominal pain.  You have redness, swelling, or pain in your eye. MAKE SURE YOU:  Understand these instructions.  Will watch your condition.  Will get help right away if you are not doing well or get worse.   This information is not intended to replace advice given to you by your health care provider. Make sure you discuss any questions you have with your health care provider.   Document Released: 03/28/2000 Document Revised: 04/21/2014 Document Reviewed: 08/16/2013 Elsevier Interactive Patient Education [employer] Elsevier Inc.

## 2015-08-06 LAB — POCT PREGNANCY, URINE: Preg Test, Ur: NEGATIVE

## 2015-08-07 ENCOUNTER — Inpatient Hospital Stay (HOSPITAL_COMMUNITY)
Admission: AD | Admit: 2015-08-07 | Discharge: 2015-08-07 | Disposition: A | Discharge status: Home / Self Care | Payer: BLUE CROSS/BLUE SHIELD | Source: Ambulatory Visit | Attending: Family Medicine | Admitting: Family Medicine

## 2015-08-07 ENCOUNTER — Encounter (HOSPITAL_COMMUNITY): Payer: Self-pay | Admitting: *Deleted

## 2015-08-07 DIAGNOSIS — N938 Other specified abnormal uterine and vaginal bleeding: Secondary | ICD-10-CM

## 2015-08-07 DIAGNOSIS — R109 Unspecified abdominal pain: Secondary | ICD-10-CM | POA: Diagnosis not present

## 2015-08-07 DIAGNOSIS — N939 Abnormal uterine and vaginal bleeding, unspecified: Secondary | ICD-10-CM | POA: Insufficient documentation

## 2015-08-07 HISTORY — DX: Other psychoactive substance abuse, uncomplicated: F19.10

## 2015-08-07 LAB — URINE MICROSCOPIC-ADD ON: WBC UA: NONE SEEN WBC/hpf (ref 0–5)

## 2015-08-07 LAB — URINALYSIS, ROUTINE W REFLEX MICROSCOPIC
BILIRUBIN URINE: NEGATIVE
GLUCOSE, UA: NEGATIVE mg/dL
KETONES UR: NEGATIVE mg/dL
LEUKOCYTES UA: NEGATIVE
Nitrite: NEGATIVE
PH: 6 (ref 5.0–8.0)
PROTEIN: NEGATIVE mg/dL
Specific Gravity, Urine: 1.015 (ref 1.005–1.030)

## 2015-08-07 LAB — POCT PREGNANCY, URINE: Preg Test, Ur: NEGATIVE

## 2015-08-07 NOTE — Discharge Instructions (Signed)

## 2015-08-07 NOTE — MAU Note (Signed)
Patient left after conversation with Artelia LarocheM. Williams, CNM. Did not wait for D/C instrs., VS's, e-sig. Has appointment with Dr. Adrian BlackwaterStinson in 2 days.

## 2015-08-07 NOTE — MAU Note (Signed)
Last time she was here, she in pain.  Yesterday she was working out and all this blood gushed out.  Was in pain for about 2 hrs. Then she stopped bleeding.  Woke up this morning, was hurting and was bleeding again, pulled 'this stuff out of her'/

## 2015-08-07 NOTE — MAU Provider Note (Signed)
Chief Complaint:  Vaginal Bleeding and Abdominal Pain   First Provider Initiated Contact with Patient 08/07/15 1520     HPI  Anita Sims is a 20 y.o. G0P0000 who presents to maternity admissions reporting one episode of bleeding and cramping yesterday.  Then bleeding stopped, then had another small amount this morning which also stopped.  That blood had some mucous in it "like the lining you told me about".  Wants to know why it stopped. States "I don't know why you won't tell me why it stopped". She reports vaginal bleeding, vaginal itching/burning, urinary symptoms, h/a, dizziness, n/v, or fever/chills.    She was seen by me on 4/21 for late menses, workup was done. Considered to be anovolation, I told her she would probably start her menses the next time she ovulates and that it would probably be heavy.  Has an appointment in the clinic in 2 days with Dr Adrian BlackwaterStinson.  RN Note: Last time she was here, she in pain. Yesterday she was working out and all this blood gushed out. Was in pain for about 2 hrs. Then she stopped bleeding. Woke up this morning, was hurting and was bleeding again, pulled 'this stuff out of her'/          Past Medical History: Past Medical History  Diagnosis Date  . Anxiety   . Bipolar 1 disorder (HCC)   . Genital herpes   . Anemia   . Gonorrhea   . Chlamydia   . Substance abuse     Past obstetric history: OB History  Gravida Para Term Preterm AB SAB TAB Ectopic Multiple Living  0 0 0 0 0 0 0 0 0 0         Past Surgical History: Past Surgical History  Procedure Laterality Date  . Mouth surgery      Family History: Family History  Problem Relation Age of Onset  . Diabetes Maternal Grandfather     type II  . Cancer Maternal Grandfather   . Heart disease Maternal Grandmother   . Heart disease Mother     Social History: Social History  Substance Use Topics  . Smoking status: Former Smoker    Types: Cigars  . Smokeless tobacco: Never  Used     Comment: quit a couple wks ago  . Alcohol Use: Yes     Comment: wkly- used to drink all the time    Allergies: No Known Allergies  Meds:  Prescriptions prior to admission  Medication Sig Dispense Refill Last Dose  . valACYclovir (VALTREX) 1000 MG tablet Take 1,000 mg by mouth daily.   08/06/2015 at Unknown time    I have reviewed patient's Past Medical Hx, Surgical Hx, Family Hx, Social Hx, medications and allergies.  ROS:  Review of Systems  Constitutional: Negative for fever and chills.  Gastrointestinal: Negative for nausea, vomiting, diarrhea and constipation.  Genitourinary: Positive for vaginal bleeding and pelvic pain. Negative for dysuria, flank pain, vaginal discharge and vaginal pain.  Musculoskeletal: Negative for back pain.  Neurological: Negative for dizziness.   Other systems negative   Physical Exam  Patient Vitals for the past 24 hrs:  BP Temp Temp src Pulse Resp Weight  08/07/15 1414 125/74 mmHg 98.3 F (36.8 C) Oral 70 16 61.689 kg (136 lb)   Constitutional: Well-developed, well-nourished female in no acute distress.  Cardiovascular: normal rate and rhythm, no ectopy audible, S1 & S2 heard, no murmur Respiratory: normal effort, no distress. Lungs CTAB with no wheezes or  crackles GI: Abd soft, non-tender.  Nondistended.  No rebound, No guarding.  Bowel Sounds audible  MS: Extremities nontender, no edema, normal ROM Neurologic: Alert and oriented x 4.   Grossly nonfocal. GU: Neg CVAT. Skin:  Warm and Dry Psych:  Affect appropriate.  PELVIC EXAM: Refused by patient   Labs: Results for orders placed or performed during the hospital encounter of 08/07/15 (from the past 24 hour(s))  Urinalysis, Routine w reflex microscopic (not at [facility])     Status: Abnormal   Collection Time: 08/07/15  2:15 PM  Result Value Ref Range   Color, Urine YELLOW YELLOW   APPearance CLEAR CLEAR   Specific Gravity, Urine 1.015 1.005 - 1.030   pH 6.0 5.0 - 8.0    Glucose, UA NEGATIVE NEGATIVE mg/dL   Hgb urine dipstick LARGE (A) NEGATIVE   Bilirubin Urine NEGATIVE NEGATIVE   Ketones, ur NEGATIVE NEGATIVE mg/dL   Protein, ur NEGATIVE NEGATIVE mg/dL   Nitrite NEGATIVE NEGATIVE   Leukocytes, UA NEGATIVE NEGATIVE  Urine microscopic-add on     Status: Abnormal   Collection Time: 08/07/15  2:15 PM  Result Value Ref Range   Squamous Epithelial / LPF 0-5 (A) NONE SEEN   WBC, UA NONE SEEN 0 - 5 WBC/hpf   RBC / HPF 0-5 0 - 5 RBC/hpf   Bacteria, UA RARE (A) NONE SEEN  Pregnancy, urine POC     Status: None   Collection Time: 08/07/15  2:45 PM  Result Value Ref Range   Preg Test, Ur NEGATIVE NEGATIVE      Imaging:  No results found.  MAU Course/MDM: I have ordered labs as follows:  UPT, UA Imaging ordered: none Results reviewed.   Patient got angry because I could not tell her why the bleeding stopped, got dressed and walked out.  Pt stable at time of discharge.  Assessment: No diagnosis found.  Plan: Left after being seen but before treatment      Medication List    ASK your doctor about these medications        valACYclovir 1000 MG tablet  Commonly known as:  VALTREX  Take 1,000 mg by mouth daily.       Encouraged to return here or to other Urgent Care/ED if she develops worsening of symptoms, increase in pain, fever, or other concerning symptoms.   Wynelle Bourgeois CNM, MSN Certified Nurse-Midwife 08/07/2015 3:20 PM

## 2015-08-09 ENCOUNTER — Ambulatory Visit: Payer: BLUE CROSS/BLUE SHIELD | Admitting: Family Medicine

## 2017-01-21 ENCOUNTER — Inpatient Hospital Stay (HOSPITAL_COMMUNITY)
Admission: AD | Admit: 2017-01-21 | Discharge: 2017-01-21 | Disposition: A | Discharge status: Home / Self Care | Payer: BLUE CROSS/BLUE SHIELD | Source: Ambulatory Visit | Attending: Family Medicine | Admitting: Family Medicine

## 2017-01-21 ENCOUNTER — Encounter (HOSPITAL_COMMUNITY): Payer: Self-pay | Admitting: *Deleted

## 2017-01-21 ENCOUNTER — Inpatient Hospital Stay (HOSPITAL_COMMUNITY): Payer: BLUE CROSS/BLUE SHIELD

## 2017-01-21 DIAGNOSIS — O99341 Other mental disorders complicating pregnancy, first trimester: Secondary | ICD-10-CM | POA: Insufficient documentation

## 2017-01-21 DIAGNOSIS — Z3A09 9 weeks gestation of pregnancy: Secondary | ICD-10-CM

## 2017-01-21 DIAGNOSIS — F419 Anxiety disorder, unspecified: Secondary | ICD-10-CM | POA: Insufficient documentation

## 2017-01-21 DIAGNOSIS — O26891 Other specified pregnancy related conditions, first trimester: Secondary | ICD-10-CM

## 2017-01-21 DIAGNOSIS — F319 Bipolar disorder, unspecified: Secondary | ICD-10-CM | POA: Insufficient documentation

## 2017-01-21 DIAGNOSIS — Z3490 Encounter for supervision of normal pregnancy, unspecified, unspecified trimester: Secondary | ICD-10-CM

## 2017-01-21 DIAGNOSIS — Z3A01 Less than 8 weeks gestation of pregnancy: Secondary | ICD-10-CM | POA: Diagnosis not present

## 2017-01-21 DIAGNOSIS — O26899 Other specified pregnancy related conditions, unspecified trimester: Secondary | ICD-10-CM

## 2017-01-21 DIAGNOSIS — Z87891 Personal history of nicotine dependence: Secondary | ICD-10-CM | POA: Diagnosis not present

## 2017-01-21 DIAGNOSIS — R109 Unspecified abdominal pain: Secondary | ICD-10-CM | POA: Diagnosis not present

## 2017-01-21 LAB — CBC
HCT: 37 % (ref 36.0–46.0)
Hemoglobin: 12.5 g/dL (ref 12.0–15.0)
MCH: 29.8 pg (ref 26.0–34.0)
MCHC: 33.8 g/dL (ref 30.0–36.0)
MCV: 88.1 fL (ref 78.0–100.0)
PLATELETS: 212 10*3/uL (ref 150–400)
RBC: 4.2 MIL/uL (ref 3.87–5.11)
RDW: 13.2 % (ref 11.5–15.5)
WBC: 7.4 10*3/uL (ref 4.0–10.5)

## 2017-01-21 LAB — URINALYSIS, ROUTINE W REFLEX MICROSCOPIC
Bilirubin Urine: NEGATIVE
Glucose, UA: NEGATIVE mg/dL
HGB URINE DIPSTICK: NEGATIVE
Ketones, ur: NEGATIVE mg/dL
NITRITE: NEGATIVE
PH: 5 (ref 5.0–8.0)
Protein, ur: NEGATIVE mg/dL
SPECIFIC GRAVITY, URINE: 1.021 (ref 1.005–1.030)

## 2017-01-21 LAB — HCG, QUANTITATIVE, PREGNANCY: HCG, BETA CHAIN, QUANT, S: 22122 m[IU]/mL — AB (ref <5)

## 2017-01-21 LAB — WET PREP, GENITAL
Clue Cells Wet Prep HPF POC: NONE SEEN
Sperm: NONE SEEN
Trich, Wet Prep: NONE SEEN
Yeast Wet Prep HPF POC: NONE SEEN

## 2017-01-21 LAB — POCT PREGNANCY, URINE: PREG TEST UR: POSITIVE — AB

## 2017-01-21 LAB — OB RESULTS CONSOLE GC/CHLAMYDIA: Gonorrhea: NEGATIVE

## 2017-01-21 NOTE — MAU Note (Signed)
Pt. Is taking therapy for narcotic addiction at [facility]. Has not medicated self for abd. Pain, cramps.

## 2017-01-21 NOTE — MAU Note (Signed)
Pt. here with left-lower abd. pain, cramping pain 5/10.  Pain started a week ago, pt. Denies vaginal bleeding, found out she was pregnant yesterday via health department.

## 2017-01-21 NOTE — MAU Provider Note (Signed)
History     CSN: [id number]  Arrival date and time: 01/21/17 4098   First Provider Initiated Contact with Patient 01/21/17 [phone]      Chief Complaint  Patient presents with  . Abdominal Pain   G1  here with LAP. Pain started 1 week ago. Describes as cramping, was originally in the mid lower abdomen then moved to LLQ. No VB. Some white discharge, no itching or odor. No fevers. No urinary sx. Last BM this am. Hx of HSV, last outbreak last month.   OB History    Gravida Para Term Preterm AB Living   1 0 0 0 0 0   SAB TAB Ectopic Multiple Live Births   0 0 0 0        Past Medical History:  Diagnosis Date  . Anemia   . Anxiety   . Bipolar 1 disorder (HCC)   . Chlamydia   . Genital herpes   . Gonorrhea   . Substance abuse [facility])     Past Surgical History:  Procedure Laterality Date  . MOUTH SURGERY      Family History  Problem Relation Age of Onset  . Diabetes Maternal Grandfather        type II  . Cancer Maternal Grandfather   . Heart disease Maternal Grandmother   . Heart disease Mother     Social History  Substance Use Topics  . Smoking status: Former Smoker    Types: Cigars  . Smokeless tobacco: Never Used     Comment: quit a couple wks ago  . Alcohol use Yes     Comment: wkly- used to drink all the time    Allergies: No Known Allergies  Prescriptions Prior to Admission  Medication Sig Dispense Refill Last Dose  . valACYclovir (VALTREX) 1000 MG tablet Take 1,000 mg by mouth daily.   08/06/2015 at Unknown time    Review of Systems  Constitutional: Negative for fever.  Gastrointestinal: Positive for abdominal pain. Negative for nausea and vomiting.  Genitourinary: Positive for vaginal discharge. Negative for dysuria and vaginal bleeding.   Physical Exam   Blood pressure 114/66, pulse 74, temperature 98.9 F (37.2 C), temperature source Oral, resp. rate 17, SpO2 100 %.  Physical Exam  Constitutional: She is oriented to person, place, and  time. She appears well-developed and well-nourished. No distress.  HENT:  Head: Normocephalic and atraumatic.  Neck: Normal range of motion.  Respiratory: Effort normal. No respiratory distress.  GI: Soft. She exhibits no distension and no mass. There is no tenderness. There is no rebound and no guarding.  Genitourinary:  Genitourinary Comments: External: no lesions or erythema Vagina: rugated, pink, moist, thin white discharge Uterus: non enlarged, anteverted, non tender, no CMT Adnexae: no masses, no tenderness left, no tenderness right   Musculoskeletal: Normal range of motion.  Neurological: She is alert and oriented to person, place, and time.  Skin: Skin is warm and dry.  Psychiatric: She has a normal mood and affect.   Results for orders placed or performed during the hospital encounter of 01/21/17 (from the past 24 hour(s))  Urinalysis, Routine w reflex microscopic     Status: Abnormal   Collection Time: 01/21/17  7:30 AM  Result Value Ref Range   Color, Urine YELLOW YELLOW   APPearance HAZY (A) CLEAR   Specific Gravity, Urine 1.021 1.005 - 1.030   pH 5.0 5.0 - 8.0   Glucose, UA NEGATIVE NEGATIVE mg/dL   Hgb urine dipstick NEGATIVE NEGATIVE  Bilirubin Urine NEGATIVE NEGATIVE   Ketones, ur NEGATIVE NEGATIVE mg/dL   Protein, ur NEGATIVE NEGATIVE mg/dL   Nitrite NEGATIVE NEGATIVE   Leukocytes, UA MODERATE (A) NEGATIVE   RBC / HPF 0-5 0 - 5 RBC/hpf   WBC, UA 0-5 0 - 5 WBC/hpf   Bacteria, UA RARE (A) NONE SEEN   Squamous Epithelial / LPF 6-30 (A) NONE SEEN   Mucus PRESENT   Pregnancy, urine POC     Status: Abnormal   Collection Time: 01/21/17  8:27 AM  Result Value Ref Range   Preg Test, Ur POSITIVE (A) NEGATIVE  Wet prep, genital     Status: Abnormal   Collection Time: 01/21/17  8:34 AM  Result Value Ref Range   Yeast Wet Prep HPF POC NONE SEEN NONE SEEN   Trich, Wet Prep NONE SEEN NONE SEEN   Clue Cells Wet Prep HPF POC NONE SEEN NONE SEEN   WBC, Wet Prep HPF POC  MANY (A) NONE SEEN   Sperm NONE SEEN   CBC     Status: None   Collection Time: 01/21/17  8:38 AM  Result Value Ref Range   WBC 7.4 4.0 - 10.5 K/uL   RBC 4.20 3.87 - 5.11 MIL/uL   Hemoglobin 12.5 12.0 - 15.0 g/dL   HCT 16.1 09.6 - 04.5 %   MCV 88.1 78.0 - 100.0 fL   MCH 29.8 26.0 - 34.0 pg   MCHC 33.8 30.0 - 36.0 g/dL   RDW 40.9 81.1 - 91.4 %   Platelets 212 150 - 400 K/uL   US Ob Comp Less 14 Wks  Result Date: 01/21/2017 CLINICAL DATA:  Left lower quadrant pain for 1 week. EXAM: OBSTETRIC <14 WK [place] AND TRANSVAGINAL OB US TECHNIQUE: Both transabdominal and transvaginal ultrasound examinations were performed for complete evaluation of the gestation as well as the maternal uterus, adnexal regions, and pelvic cul-de-sac. Transvaginal technique was performed to assess early pregnancy. COMPARISON:  None. FINDINGS: Intrauterine gestational sac: Single Yolk sac:  Visualized. Embryo:  Not Visualized. MSD: 11.7  mm   5 w   6  d Maternal uterus/adnexae: Subchorionic hemorrhage: None Right ovary: Normal Left ovary: Normal Other :None Free fluid:  Trace free fluid. IMPRESSION: 1. Probable early intrauterine gestational sac and yolk sac, but no fetal pole, or cardiac activity yet visualized. Recommend follow-up quantitative B-HCG levels and follow-up US in 14 days to assess viability. This recommendation follows SRU consensus guidelines: Diagnostic Criteria for Nonviable Pregnancy Early in the First Trimester. Malva Limes Med 2013; 782:9562-13. Electronically Signed   By: Signa Kell M.D.   On: 01/21/2017 09:28   US Ob Transvaginal  Result Date: 01/21/2017 CLINICAL DATA:  Left lower quadrant pain for 1 week. EXAM: OBSTETRIC <14 WK [place] AND TRANSVAGINAL OB US TECHNIQUE: Both transabdominal and transvaginal ultrasound examinations were performed for complete evaluation of the gestation as well as the maternal uterus, adnexal regions, and pelvic cul-de-sac. Transvaginal technique was performed to assess early  pregnancy. COMPARISON:  None. FINDINGS: Intrauterine gestational sac: Single Yolk sac:  Visualized. Embryo:  Not Visualized. MSD: 11.7  mm   5 w   6  d Maternal uterus/adnexae: Subchorionic hemorrhage: None Right ovary: Normal Left ovary: Normal Other :None Free fluid:  Trace free fluid. IMPRESSION: 1. Probable early intrauterine gestational sac and yolk sac, but no fetal pole, or cardiac activity yet visualized. Recommend follow-up quantitative B-HCG levels and follow-up US in 14 days to assess viability. This recommendation follows SRU consensus  guidelines: Diagnostic Criteria for Nonviable Pregnancy Early in the First Trimester. Malva Limes Med 2013; 409:8119-14. Electronically Signed   By: Signa Kell M.D.   On: 01/21/2017 09:28   MAU Course  Procedures  MDM Labs and [place] ordered and reviewed. No evidence of UTI or vaginal infection, pain likely physiologic to early pregnancy. US shows IUGS and YS but no FP which likely reflects early pregnancy. Pt is already scheduled at [facility] [place] and can have f/u US there for viability. Stable for discharge home.   Assessment and Plan   1. Early stage of pregnancy   2. Abdominal cramping affecting pregnancy    Discharge home Follow up at [facility] [place] as scheduled Bleeding/return precautions  Allergies as of 01/21/2017   No Known Allergies     Medication List    STOP taking these medications   valACYclovir 1000 MG tablet Commonly known as:  Donnamae Jude, CNM 01/21/2017, 9:31 AM

## 2017-01-21 NOTE — Discharge Instructions (Signed)

## 2017-01-22 LAB — GC/CHLAMYDIA PROBE AMP (~~LOC~~) NOT AT ARMC
Chlamydia: NEGATIVE
Neisseria Gonorrhea: NEGATIVE

## 2017-02-11 LAB — OB RESULTS CONSOLE HEPATITIS B SURFACE ANTIGEN: HEP B S AG: NEGATIVE

## 2017-02-11 LAB — OB RESULTS CONSOLE RPR: RPR: NONREACTIVE

## 2017-02-11 LAB — OB RESULTS CONSOLE ANTIBODY SCREEN: Antibody Screen: NEGATIVE

## 2017-02-11 LAB — OB RESULTS CONSOLE HIV ANTIBODY (ROUTINE TESTING): HIV: NONREACTIVE

## 2017-02-11 LAB — OB RESULTS CONSOLE ABO/RH: RH Type: POSITIVE

## 2017-02-11 LAB — OB RESULTS CONSOLE RUBELLA ANTIBODY, IGM: RUBELLA: IMMUNE

## 2017-03-09 DIAGNOSIS — Z3490 Encounter for supervision of normal pregnancy, unspecified, unspecified trimester: Secondary | ICD-10-CM | POA: Insufficient documentation

## 2017-04-14 NOTE — L&D Delivery Note (Addendum)
Delivery Note Anita Sims is a 22 y.o. G1P1001 at [redacted]w[redacted]d admitted for SROM.  Labor course: augmented w/ pitocin.  At 2233 a viable female was delivered via spontaneous vaginal delivery (Presentation: ; LOA ) by Dr. Cristie Hem under my supervision. Loose nuchal x 1, delivered through, reduced immediately after birth. Infant placed directly on mom's abdomen for bonding/skin-to-skin. Delayed cord clamping x , then cord clamped x 2, and cut by FOB.  APGAR: 9,9 ; weight: pending at time of note.  40 units of pitocin diluted in 1000cc LR was infused rapidly IV per protocol. The placenta separated spontaneously and delivered via CCT and maternal pushing effort.  It was inspected and appears to be intact with a 3 VC.  Placenta/Cord with the following complications: none .  Cord pH: not done  Intrapartum complications:  None Anesthesia:  none Episiotomy: none Lacerations:  1st degree perineal, hemostatic, not repaired Suture Repair: n/a Est. Blood Loss (mL): 50 Sponge and instrument count were correct x2.  Mom to postpartum.  Baby to Couplet care / Skin to Skin. Placenta to L&D. Plans to breastfeed  Cheral Marker CNM, [facility] 09/03/17

## 2017-05-25 LAB — OB RESULTS CONSOLE GC/CHLAMYDIA: GC PROBE AMP, GENITAL: NEGATIVE

## 2017-09-01 ENCOUNTER — Inpatient Hospital Stay (HOSPITAL_COMMUNITY)
Admission: AD | Admit: 2017-09-01 | Discharge: 2017-09-03 | DRG: 806 | Disposition: A | Discharge status: Home / Self Care | Payer: BLUE CROSS/BLUE SHIELD | Source: Ambulatory Visit | Attending: Obstetrics and Gynecology | Admitting: Obstetrics and Gynecology

## 2017-09-01 ENCOUNTER — Encounter (HOSPITAL_COMMUNITY): Payer: Self-pay

## 2017-09-01 ENCOUNTER — Other Ambulatory Visit: Payer: Self-pay

## 2017-09-01 DIAGNOSIS — O9832 Other infections with a predominantly sexual mode of transmission complicating childbirth: Secondary | ICD-10-CM | POA: Diagnosis present

## 2017-09-01 DIAGNOSIS — A6000 Herpesviral infection of urogenital system, unspecified: Secondary | ICD-10-CM | POA: Diagnosis present

## 2017-09-01 DIAGNOSIS — O4202 Full-term premature rupture of membranes, onset of labor within 24 hours of rupture: Secondary | ICD-10-CM

## 2017-09-01 DIAGNOSIS — O99324 Drug use complicating childbirth: Secondary | ICD-10-CM | POA: Diagnosis present

## 2017-09-01 DIAGNOSIS — Z3A37 37 weeks gestation of pregnancy: Secondary | ICD-10-CM

## 2017-09-01 DIAGNOSIS — O4292 Full-term premature rupture of membranes, unspecified as to length of time between rupture and onset of labor: Secondary | ICD-10-CM | POA: Diagnosis present

## 2017-09-01 DIAGNOSIS — F1490 Cocaine use, unspecified, uncomplicated: Secondary | ICD-10-CM | POA: Diagnosis present

## 2017-09-01 DIAGNOSIS — Z87891 Personal history of nicotine dependence: Secondary | ICD-10-CM

## 2017-09-01 DIAGNOSIS — F1910 Other psychoactive substance abuse, uncomplicated: Secondary | ICD-10-CM | POA: Diagnosis not present

## 2017-09-01 DIAGNOSIS — O134 Gestational [pregnancy-induced] hypertension without significant proteinuria, complicating childbirth: Secondary | ICD-10-CM | POA: Diagnosis present

## 2017-09-01 DIAGNOSIS — R030 Elevated blood-pressure reading, without diagnosis of hypertension: Secondary | ICD-10-CM | POA: Diagnosis not present

## 2017-09-01 LAB — CBC WITH DIFFERENTIAL/PLATELET
BASOS ABS: 0 10*3/uL (ref 0.0–0.1)
BASOS PCT: 0 %
Eosinophils Absolute: 0.1 10*3/uL (ref 0.0–0.7)
Eosinophils Relative: 0 %
HEMATOCRIT: 39.1 % (ref 36.0–46.0)
HEMOGLOBIN: 13.1 g/dL (ref 12.0–15.0)
LYMPHS PCT: 13 %
Lymphs Abs: 2 10*3/uL (ref 0.7–4.0)
MCH: 29.6 pg (ref 26.0–34.0)
MCHC: 33.5 g/dL (ref 30.0–36.0)
MCV: 88.5 fL (ref 78.0–100.0)
MONOS PCT: 1 %
Monocytes Absolute: 0.2 10*3/uL (ref 0.1–1.0)
NEUTROS PCT: 86 %
Neutro Abs: 13.6 10*3/uL — ABNORMAL HIGH (ref 1.7–7.7)
Platelets: 239 10*3/uL (ref 150–400)
RBC: 4.42 MIL/uL (ref 3.87–5.11)
RDW: 14.9 % (ref 11.5–15.5)
WBC: 15.8 10*3/uL — ABNORMAL HIGH (ref 4.0–10.5)

## 2017-09-01 LAB — TYPE AND SCREEN
ABO/RH(D): B POS
Antibody Screen: NEGATIVE

## 2017-09-01 LAB — COMPREHENSIVE METABOLIC PANEL
ALBUMIN: 3.4 g/dL — AB (ref 3.5–5.0)
ALT: 13 U/L — ABNORMAL LOW (ref 14–54)
AST: 29 U/L (ref 15–41)
Alkaline Phosphatase: 169 U/L — ABNORMAL HIGH (ref 38–126)
Anion gap: 17 — ABNORMAL HIGH (ref 5–15)
BILIRUBIN TOTAL: 0.7 mg/dL (ref 0.3–1.2)
BUN: 8 mg/dL (ref 6–20)
CALCIUM: 9.5 mg/dL (ref 8.9–10.3)
CO2: 15 mmol/L — ABNORMAL LOW (ref 22–32)
Chloride: 103 mmol/L (ref 101–111)
Creatinine, Ser: 0.63 mg/dL (ref 0.44–1.00)
GFR calc Af Amer: >60 mL/min (ref >60)
GFR calc non Af Amer: >60 mL/min (ref >60)
GLUCOSE: 113 mg/dL — AB (ref 65–99)
Potassium: 3.3 mmol/L — ABNORMAL LOW (ref 3.5–5.1)
Sodium: 135 mmol/L (ref 135–145)
TOTAL PROTEIN: 7.6 g/dL (ref 6.5–8.1)

## 2017-09-01 LAB — CBC
HEMATOCRIT: 41.2 % (ref 36.0–46.0)
Hemoglobin: 13.5 g/dL (ref 12.0–15.0)
MCH: 29.2 pg (ref 26.0–34.0)
MCHC: 32.8 g/dL (ref 30.0–36.0)
MCV: 89.2 fL (ref 78.0–100.0)
Platelets: 232 10*3/uL (ref 150–400)
RBC: 4.62 MIL/uL (ref 3.87–5.11)
RDW: 14.9 % (ref 11.5–15.5)
WBC: 11.2 10*3/uL — ABNORMAL HIGH (ref 4.0–10.5)

## 2017-09-01 LAB — RAPID URINE DRUG SCREEN, HOSP PERFORMED
AMPHETAMINES: NOT DETECTED
BARBITURATES: NOT DETECTED
BENZODIAZEPINES: NOT DETECTED
COCAINE: NOT DETECTED
Opiates: NOT DETECTED
Tetrahydrocannabinol: NOT DETECTED

## 2017-09-01 LAB — ABO/RH: ABO/RH(D): B POS

## 2017-09-01 LAB — GROUP B STREP BY PCR: Group B strep by PCR: NEGATIVE

## 2017-09-01 LAB — POCT FERN TEST: POCT FERN TEST: POSITIVE

## 2017-09-01 LAB — OB RESULTS CONSOLE GBS: GBS: NEGATIVE

## 2017-09-01 MED ORDER — LIDOCAINE HCL (PF) 1 % IJ SOLN
30.0000 mL | INTRAMUSCULAR | Status: DC | PRN
  Filled 2017-09-01: qty 30

## 2017-09-01 MED ORDER — ONDANSETRON HCL 4 MG/2ML IJ SOLN: 4.0000 mg | Freq: Four times a day (QID) | INTRAMUSCULAR | Status: DC | PRN

## 2017-09-01 MED ORDER — EPHEDRINE 5 MG/ML INJ
10.0000 mg | INTRAVENOUS | Status: DC | PRN
  Filled 2017-09-01: qty 2

## 2017-09-01 MED ORDER — LACTATED RINGERS IV SOLN: 500.0000 mL | INTRAVENOUS | Status: DC | PRN

## 2017-09-01 MED ORDER — PHENYLEPHRINE 40 MCG/ML (10ML) SYRINGE FOR IV PUSH (FOR BLOOD PRESSURE SUPPORT)
80.0000 ug | PREFILLED_SYRINGE | INTRAVENOUS | Status: DC | PRN
  Filled 2017-09-01: qty 5

## 2017-09-01 MED ORDER — OXYCODONE-ACETAMINOPHEN 5-325 MG PO TABS: 1.0000 | ORAL_TABLET | ORAL | Status: DC | PRN

## 2017-09-01 MED ORDER — LACTATED RINGERS IV SOLN
INTRAVENOUS | Status: DC
  Administered 2017-09-01 (×2): via INTRAVENOUS

## 2017-09-01 MED ORDER — FENTANYL CITRATE (PF) 100 MCG/2ML IJ SOLN
100.0000 ug | INTRAMUSCULAR | Status: DC | PRN
  Administered 2017-09-01 (×2): 100 ug via INTRAVENOUS
  Filled 2017-09-01 (×2): qty 2

## 2017-09-01 MED ORDER — OXYTOCIN 40 UNITS IN LACTATED RINGERS INFUSION - SIMPLE MED
1.0000 m[IU]/min | INTRAVENOUS | Status: DC
  Administered 2017-09-01: 2 m[IU]/min via INTRAVENOUS
  Filled 2017-09-01: qty 1000

## 2017-09-01 MED ORDER — OXYTOCIN BOLUS FROM INFUSION
500.0000 mL | Freq: Once | INTRAVENOUS | Status: AC
  Administered 2017-09-01: 500 mL via INTRAVENOUS

## 2017-09-01 MED ORDER — LACTATED RINGERS IV SOLN: 500.0000 mL | Freq: Once | INTRAVENOUS | Status: DC

## 2017-09-01 MED ORDER — OXYTOCIN 40 UNITS IN LACTATED RINGERS INFUSION - SIMPLE MED: 2.5000 [IU]/h | INTRAVENOUS | Status: DC

## 2017-09-01 MED ORDER — DIPHENHYDRAMINE HCL 50 MG/ML IJ SOLN: 12.5000 mg | INTRAMUSCULAR | Status: DC | PRN

## 2017-09-01 MED ORDER — FENTANYL 2.5 MCG/ML BUPIVACAINE 1/10 % EPIDURAL INFUSION (WH - ANES): 14.0000 mL/h | INTRAMUSCULAR | Status: DC | PRN

## 2017-09-01 MED ORDER — OXYCODONE-ACETAMINOPHEN 5-325 MG PO TABS
2.0000 | ORAL_TABLET | ORAL | Status: DC | PRN
Start: 2017-09-01 — End: 2017-09-02

## 2017-09-01 MED ORDER — ACETAMINOPHEN 325 MG PO TABS: 650.0000 mg | ORAL_TABLET | ORAL | Status: DC | PRN

## 2017-09-01 MED ORDER — FLEET ENEMA 7-19 GM/118ML RE ENEM: 1.0000 | ENEMA | RECTAL | Status: DC | PRN

## 2017-09-01 MED ORDER — SOD CITRATE-CITRIC ACID 500-334 MG/5ML PO SOLN: 30.0000 mL | ORAL | Status: DC | PRN

## 2017-09-01 MED ORDER — TERBUTALINE SULFATE 1 MG/ML IJ SOLN
0.2500 mg | Freq: Once | INTRAMUSCULAR | Status: DC | PRN
  Filled 2017-09-01: qty 1

## 2017-09-01 NOTE — Anesthesia Pain Management Evaluation Note (Signed)
  CRNA Pain Management Visit Note  Patient: Anita Sims, 22 y.o., female  "Hello I am a member of the anesthesia team at [facility]. We have an anesthesia team available at all times to provide care throughout the hospital, including epidural management and anesthesia for C-section. I don't know your plan for the delivery whether it a natural birth, water birth, IV sedation, nitrous supplementation, doula or epidural, but we want to meet your pain goals."   1.Was your pain managed to your expectations on prior hospitalizations?   No prior hospitalizations  2.What is your expectation for pain management during this hospitalization?     Epidural  3.How can we help you reach that goal? Epidural as necessary. Plans to try less invasive IV meds first  Record the patient's initial score and the patient's pain goal.   Pain: 0  Pain Goal: 10 [facility] wants you to be able to say your pain was always managed very well.  Cleda Clarks 09/01/2017

## 2017-09-01 NOTE — MAU Provider Note (Signed)
History   [id number]   Chief Complaint  Patient presents with  . Rupture of Membranes    HPI Anita Sims is a 22 y.o. female  G1P0000 .1 wks here with report of gush of clear fluid at 1115 today.  Leaking of fluid has continued. Pt reports no contractions. She denies vaginal bleeding. She reports good fetal movement. All other systems negative. Hx of HSV, has been taking suppression. Had outbreak of 1 lesion 2 weeks ago, lasted 3 days. Denies current lesions or pain.  No LMP recorded. Patient is pregnant.  OB History  Gravida Para Term Preterm AB Living  1 0 0 0 0 0  SAB TAB Ectopic Multiple Live Births  0 0 0 0      # Outcome Date GA Lbr Len/2nd Weight Sex Delivery Anes PTL Lv  1 Current             Past Medical History:  Diagnosis Date  . Anemia   . Anxiety   . Bipolar 1 disorder (HCC)   . Chlamydia   . Genital herpes   . Gonorrhea   . Substance abuse (HCC)     Family History  Problem Relation Age of Onset  . Diabetes Maternal Grandfather        type II  . Cancer Maternal Grandfather   . Heart disease Maternal Grandmother   . Heart disease Mother     Social History   Socioeconomic History  . Marital status: Single    Spouse name: Not on file  . Number of children: Not on file  . Years of education: Not on file  . Highest education level: Not on file  Occupational History  . Not on file  Social Needs  . Financial resource strain: Not on file  . Food insecurity:    Worry: Not on file    Inability: Not on file  . Transportation needs:    Medical: Not on file    Non-medical: Not on file  Tobacco Use  . Smoking status: Former Smoker    Types: Cigars  . Smokeless tobacco: Never Used  . Tobacco comment: quit a couple wks ago  Substance and Sexual Activity  . Alcohol use: Yes    Comment: wkly- used to drink all the time  . Drug use: Yes    Types: Marijuana, Cocaine, Other-see comments    Comment: every other day, last cocaine was 2 wks  ago, used to take a lot of xanax and percocet  . Sexual activity: Yes    Partners: Male    Birth control/protection: None  Lifestyle  . Physical activity:    Days per week: Not on file    Minutes per session: Not on file  . Stress: Not on file  Relationships  . Social connections:    Talks on phone: Not on file    Gets together: Not on file    Attends religious service: Not on file    Active member of club or organization: Not on file    Attends meetings of clubs or organizations: Not on file    Relationship status: Not on file  Other Topics Concern  . Not on file  Social History Narrative  . Not on file    No Known Allergies  No current facility-administered medications on file prior to encounter.    No current outpatient medications on file prior to encounter.     Review of Systems  Gastrointestinal: Negative for abdominal pain.  Genitourinary: Positive for vaginal discharge. Negative for vaginal bleeding.     Physical Exam   Vitals:   09/01/17 1143  BP: 130/77  Pulse: 93  Resp: 18  Temp: 98.5 F (36.9 C)  TempSrc: Oral    Physical Exam  Constitutional: She is oriented to person, place, and time. She appears well-developed and well-nourished.  HENT:  Head: Normocephalic and atraumatic.  Neck: Normal range of motion.  Respiratory: Effort normal. No respiratory distress.  Genitourinary:  Genitourinary Comments: External: no lesions SSE: grossly ruptured, no lesions seen  Musculoskeletal: Normal range of motion.  Neurological: She is alert and oriented to person, place, and time.  Skin: Skin is warm and dry.  Psychiatric: She has a normal mood and affect.  Bedside US: vtx  EFM: 145 bpm, mod variability, + accels, no decels Toco: rare  MAU Course  Procedures  MDM Gross ROM. No active lesions. Vtx presentation. Admit to BS.   Assessment and Plan  [redacted] weeks gestation Reactive NST PROM at term Admit to [facility] per labor team   Donette Larry, [place] 09/01/2017 12:10 PM

## 2017-09-01 NOTE — MAU Note (Signed)
Pt presents to MAU with c/o PROM. Pt felt gush of clear fluid around 1115 today and is having pressure in lower abdomen at this time. Pt denies VB. +FM

## 2017-09-01 NOTE — H&P (Addendum)
Anita Sims is a 22 y.o. female presenting for SROM at 1100 today. Pt reports occasional lower abdominal pain 4-6/10, clear fluid since SROM. FOB "Anita Sims" is at bedside and plans to stay as primary support person.  OB History    Gravida  1   Para  0   Term  0   Preterm  0   AB  0   Living  0     SAB  0   TAB  0   Ectopic  0   Multiple  0   Live Births  0          Past Medical History:  Diagnosis Date  . Anemia    hx of, but not during pregnancy  . Anxiety    "was on medication long time ago"; pt states  . Bipolar 1 disorder (HCC)    "was on medication a long time ago" pt states  . Chlamydia   . Genital herpes   . Gonorrhea   . Substance abuse [facility])    Past Surgical History:  Procedure Laterality Date  . MOUTH SURGERY     Family History: family history includes Cancer in her maternal grandfather; Diabetes in her maternal grandfather; Heart disease in her maternal grandmother and mother. Social History:  reports that she has quit smoking. Her smoking use included cigars. She has never used smokeless tobacco. She reports that she drinks alcohol. She reports that she has current or past drug history. Drugs: Marijuana, Cocaine, and Other-see comments.     Maternal Diabetes: No Genetic Screening: Declined Maternal Ultrasounds/Referrals: Normal Fetal Ultrasounds or other Referrals:  None Maternal Substance Abuse:  No Significant Maternal Medications:  None Significant Maternal Lab Results:  None Other Comments:  None  Review of Systems  Constitutional: Negative for chills, fever, malaise/fatigue and weight loss.  Eyes: Negative for blurred vision.  Respiratory: Negative for cough, shortness of breath and wheezing.   Cardiovascular: Negative for chest pain, palpitations and leg swelling.  Gastrointestinal: Positive for abdominal pain. Negative for constipation.  Genitourinary: Negative for flank pain, frequency, hematuria and urgency.  Skin: Negative  for itching and rash.  Neurological: Negative for dizziness, tingling, seizures and headaches.  Psychiatric/Behavioral: Negative for depression, hallucinations, substance abuse and suicidal ideas.   History   Blood pressure 135/80, pulse 92, temperature 98.5 F (36.9 C), temperature source Oral, resp. rate 18, height  (1.6 m), weight 213 lb (96.6 kg). Maternal Exam:  Uterine Assessment: Contraction strength is mild.  Contraction frequency is rare.   Abdomen: Patient reports no abdominal tenderness. Introitus: Normal vulva. Normal vagina.  Amniotic fluid character: clear.  Cervix: not evaluated.   Fetal Exam Fetal Monitor Review: Mode: ultrasound.   Baseline rate: 145.  Variability: moderate (6-25 bpm).   Pattern: accelerations present and no decelerations.    Fetal State Assessment: Category I - tracings are normal.     Physical Exam  Constitutional: She is oriented to person, place, and time. She appears well-developed and well-nourished.  HENT:  Head: Normocephalic.  Cardiovascular: Normal rate and intact distal pulses.  Respiratory: Effort normal.  GI:  Gravid  Musculoskeletal: Normal range of motion.  Neurological: She is alert and oriented to person, place, and time. She has normal reflexes.  Skin: Skin is warm and dry.  Psychiatric: She has a normal mood and affect. Her behavior is normal. Judgment and thought content normal.    Prenatal labs: ABO, Rh: --/--/B POS (05/21 1244) Antibody: NEG (05/21 1244) Rubella: Immune (  10/31 0000) RPR: Nonreactive (10/31 0000)  HBsAg: Negative (10/31 0000)  HIV: Non-reactive (10/31 0000)  GBS:   Unknown, swabbed at health dept Friday 08/28/17  Assessment/Plan: A: - Hx HSV, on suppression, no lesions noted upon spec exam in MAU today - early labor - Category 1 EFM: Baseline 145, moderate variability, positive accels, no decels noted - Uterine irritability with rare contractions, duration 60-80 seconds  P: - Rapid  GBS negative - UDS now for + cocaine two weeks ago - Expectant management, discussed possible Pitocin augmentation PRN - SVE deferred until approximately six hour s/p SROM - Plan for nitrous as primary labor analgesia  - PP contraception undecided, previously dissatisfied with Nexplanon and Depo, counseled on POP, IUD  - Girl, breast, Peds undecided  Anita Sims, CNM 09/01/2017, 3:29 PM

## 2017-09-01 NOTE — Progress Notes (Signed)
Labor Progress Note Anita Sims is a 21 y.o. G1P0000 at [redacted]w[redacted]d presented for SROM at 1100 today.    S:  Patient able to feel contractions in her lower belly.  Pain is partially relieved with medication.  Denies symptoms of HA, vision changes, epigastric/RUQ pain.   DTR present.    O:  BP (!) 141/72   Pulse 99   Temp 97.6 F (36.4 C) (Axillary)   Resp 16   Ht  (1.6 m)   Wt 213 lb (96.6 kg)   BMI 37.73 kg/m  EFM: 140bpm, moderate variability, reactive accelerations, absent decelerations  CVE: Dilation: Lip/rim Effacement (%): 100 Station: Plus 1 Presentation: Vertex Exam by:: booker, cnm   A&P: 22 y.o. G1P0000 [redacted]w[redacted]d presenting for SROM at 1100 today.  #Labor: Progressing well. Continue pitocin.   #HTN: Elevated BP 140s/80s.  Asymptomatic.  Will rule out severe features with PIH labs. #Pain: Analgesics as needed. #FWB: Cat I #GBS negative   Alfonso Ellis, Medical Student 10:13 PM

## 2017-09-02 ENCOUNTER — Encounter (HOSPITAL_COMMUNITY): Payer: Self-pay

## 2017-09-02 LAB — RPR: RPR: NONREACTIVE

## 2017-09-02 MED ORDER — COCONUT OIL OIL: 1.0000 "application " | TOPICAL_OIL | Status: DC | PRN

## 2017-09-02 MED ORDER — ONDANSETRON HCL 4 MG/2ML IJ SOLN: 4.0000 mg | INTRAMUSCULAR | Status: DC | PRN

## 2017-09-02 MED ORDER — IBUPROFEN 600 MG PO TABS
600.0000 mg | ORAL_TABLET | Freq: Four times a day (QID) | ORAL | Status: DC
  Administered 2017-09-02 – 2017-09-03 (×5): 600 mg via ORAL
  Filled 2017-09-02 (×6): qty 1

## 2017-09-02 MED ORDER — SIMETHICONE 80 MG PO CHEW: 80.0000 mg | CHEWABLE_TABLET | ORAL | Status: DC | PRN

## 2017-09-02 MED ORDER — ACETAMINOPHEN 325 MG PO TABS: 650.0000 mg | ORAL_TABLET | ORAL | Status: DC | PRN

## 2017-09-02 MED ORDER — DIBUCAINE 1 % RE OINT: 1.0000 "application " | TOPICAL_OINTMENT | RECTAL | Status: DC | PRN

## 2017-09-02 MED ORDER — WITCH HAZEL-GLYCERIN EX PADS: 1.0000 "application " | MEDICATED_PAD | CUTANEOUS | Status: DC | PRN

## 2017-09-02 MED ORDER — SENNOSIDES-DOCUSATE SODIUM 8.6-50 MG PO TABS
2.0000 | ORAL_TABLET | ORAL | Status: DC
  Filled 2017-09-02: qty 2

## 2017-09-02 MED ORDER — BENZOCAINE-MENTHOL 20-0.5 % EX AERO: 1.0000 "application " | INHALATION_SPRAY | CUTANEOUS | Status: DC | PRN

## 2017-09-02 MED ORDER — ZOLPIDEM TARTRATE 5 MG PO TABS: 5.0000 mg | ORAL_TABLET | Freq: Every evening | ORAL | Status: DC | PRN

## 2017-09-02 MED ORDER — PRENATAL MULTIVITAMIN CH
1.0000 | ORAL_TABLET | Freq: Every day | ORAL | Status: DC
  Administered 2017-09-02 – 2017-09-03 (×2): 1 via ORAL
  Filled 2017-09-02 (×2): qty 1

## 2017-09-02 MED ORDER — TETANUS-DIPHTH-ACELL PERTUSSIS 5-2.5-18.5 LF-MCG/0.5 IM SUSP: 0.5000 mL | Freq: Once | INTRAMUSCULAR | Status: DC

## 2017-09-02 MED ORDER — DIPHENHYDRAMINE HCL 25 MG PO CAPS: 25.0000 mg | ORAL_CAPSULE | Freq: Four times a day (QID) | ORAL | Status: DC | PRN

## 2017-09-02 MED ORDER — ONDANSETRON HCL 4 MG PO TABS: 4.0000 mg | ORAL_TABLET | ORAL | Status: DC | PRN

## 2017-09-02 NOTE — Lactation Note (Signed)
This note was copied from a baby's chart. Lactation Consultation Note  Patient Name: Girl Parish Augustine [id number] Date: 09/02/2017 Reason for consult: Initial assessment;Early term 37-38.6wks;Primapara  Breastfeeding consultation services and support information given to patient.  Mom states baby is latching but using her as a pacifier.  Discussed baby's need to eat frequently and importance of feeding with any cue.  Offered latch assist but mom declined.  Encouraged to call out for assist prn.  Maternal Data    Feeding Feeding Type: Breast Fed Length of feed: 5 min  LATCH Score Latch: Grasps breast easily, tongue down, lips flanged, rhythmical sucking.  Audible Swallowing: A few with stimulation  Type of Nipple: Everted at rest and after stimulation(noted short shafts)  Comfort (Breast/Nipple): Soft / non-tender  Hold (Positioning): Assistance needed to correctly position infant at breast and maintain latch.  LATCH Score: 8  Interventions Interventions: Shells;Skin to skin;Expressed milk;Hand pump  Lactation Tools Discussed/Used     Consult Status Consult Status: Follow-up Date: 09/03/17 Follow-up type: In-patient    Huston Foley 09/02/2017, 1:07 PM

## 2017-09-02 NOTE — Clinical Social Work Maternal (Signed)
CLINICAL SOCIAL WORK MATERNAL/CHILD NOTE  Patient Details  Name: Anita Sims MRN: [id number] Date of Birth: Aug 24, 1995  Date:  09/02/2017  Clinical Social Worker Initiating Note:  Terri Piedra, [place] Date/Time: Initiated:  09/02/17/1400     Child's Name:  Anita Sims [facility]   Biological Parents:  Mother, Father(Anita Sims and [facility])   Need for Interpreter:  None   Reason for Referral:  Behavioral Health Concerns, Current Substance Use/Substance Use During Pregnancy    Address:  [address] [place] [address]    Phone number:  [phone] (home)     Additional phone number:   Household Members/Support Persons (HM/SP):   Household Member/Support Person 1, Household Member/Support Person 2, Household Member/Support Person 3, Household Member/Support Person 4   HM/SP Name Relationship DOB or Age  HM/SP -1 Anita Sims Mother 11/12/77  HM/SP -2   sister 42  HM/SP -3   sister 48  HM/SP -4   brother 21  HM/SP -5        HM/SP -6        HM/SP -7        HM/SP -8          Natural Supports (not living in the home):  Spouse/significant other, Immediate Family, Extended Family(MOB states FOB stays over with her most of the time, but also lives with his family in [place].  She reports his family is supportive.)   Professional Supports: None(MOB reports that she went to [place] treatment at the [facility] and was clean prior to pregnancy.)   Employment:     Type of Work:     Education:      Homebound arranged:    Pensions consultant:  Other(Comment), Medicaid(MOB states she has BCBS and Pregnancy Medicaid)   Other Resources:      Cultural/Religious Considerations Which May Impact Care: None stated.  MOB's facesheet notes religion as Psychologist, forensic.  Strengths:  Ability to meet basic needs , Home prepared for child    Psychotropic Medications:         Pediatrician:       Pediatrician List:   [facility]      Pediatrician Fax Number:    Risk Factors/Current Problems:  Substance Use , Mental Health Concerns    Cognitive State:  Linear Thinking , Goal Oriented (sleepy)   Mood/Affect:  Calm    CSW Assessment: CSW met with MOB in her first floor room/135 to offer support and complete assessment due to hx of Bipolar and Anxiety and substance use.  Consult to CSW notes that MOB [facility].  CSW cannot locate this result in MOB's chart and has requested that pediatric staff contact the Health Department to ensure that we have MOB's complete record in her file.  When CSW arrived to MOB's room, her mother was present, but packing up her belongings to leave.  MOB agreed to speak with CSW at this time, but appeared to be very sleepy.   MOB reports that labor and delivery went very well and that she was "okay with it" when she found out that she was pregnant.  CSW asked how her boyfriend reacted and she said, "he was happy."  She states that both sides of their family have been very supportive of the pregnancy and  that they have everything they need for baby at home.  MOB was aware of SIDS precautions as reviewed by CSW and states baby will be sleeping in a crib in her bedroom.  She states FOB stays with her most of the time, but that his family also lives in [place].   CSW spoke about PMADs at length and how MOB is at a heightened risk given hx of Bipolar and Anxiety.  CSW asked MOB how these diagnoses have affected her life thus far.  MOB replied, "how have they affected my life?  I used drugs."  She added that she went to treatment with Mr. Ringer ([facility]) and was clean even before becoming pregnant.  She became slightly defensive, but not unpleasant, and stated that she did not feel she needed to talk about her hx of substance use and that she felt tired.  CSW  apologized for asking her to speak about something she did not want to talk about, but stated the importance of identifying ongoing support in order to maintain sobriety.  MOB pointed to baby and said, "her."  CSW acknowledged baby as a positive source of motivation, but also discussed how babies can be a source of stress.  MOB states she has good coping skills such as writing and reading.  She is not in any ongoing treatment or support groups and she does not feel like talking about her substance use is helpful.  She states "I just want to move forward."  CSW suggests that talking about her sobriety is a celebration and something she can be proud of.  CSW also suggests that sobriety takes work and support.  CSW asked how she is addressing her mental health concerns now that she is no longer self-medicating.  She states, "I got off birth control."  She reports that being on Nexplanon and Depoprovera "drove me crazy."  She reports that she will use condoms and she does not want to be on any birth control.  She also told CSW that she had a natural delivery with no epidural because of her sobriety/not wanting any medications.  She adamantly denies all substance use during pregnancy.  She was understanding of hospital drug screen policy and stated no concerns with baby being screened.  CSW understands, however, that the first set of cotton balls were removed from the diaper and a new set was placed after the first void.  CSW will monitor the CDS and make report if warranted.  MOB was receptive to information on mental health follow up if concerns arise during the postpartum time period and agreed to monitor her emotions with the New Mom Checklist from [employer] and the [place] Postnatal Depression Scale. CSW contacted the [facility] LCSW/K. Herzing to inquire about UDS that is noted in OB admission notes to have been positive for cocaine and THC on 08/18/17.  She states they do not have a UDS on this date and  that they do not have a positive screen for MOB in pregnancy.  CSW requests the remainder of MOB's PNR, as the last visit date on file in Epic is 07/30/17.  LCSW sent the remainder of her PNR, which CSW will place in MOB's paper chart.  CSW Plan/Description: No further interventions/No barriers to discharge, Perinatal Mood and Anxiety Disorder (PMADs) Education, [place], Other Information/Referral to [employer], CSW Will Continue to Monitor Umbilical Cord Tissue Drug Screen Results and Make Report if Warranted, Other(CSW has requested that pediatric  staff contact [facility] to ensure that we have all of MOB's PNRs.  It is noted in CSW consult that MOB had a positive UDS for cocaine and THC on 08/18/17, but there are not records from this date in MOB's chart.  )    Alphonzo Cruise, [place] 09/02/2017, 3:30 PM

## 2017-09-02 NOTE — Progress Notes (Signed)
POSTPARTUM PROGRESS NOTE  Post Partum Day 1  Subjective:  Anita Sims is a 22 y.o. G1P1001 s/p NSVD at [redacted]w[redacted]d.  She reports she is doing well. No acute events overnight. She denies any problems with ambulating, voiding or po intake. Denies nausea or vomiting.  Pain is well controlled.  Lochia is nml.  Patient denies HA, vision changes, RUQ/epigastric pain.  Objective: Blood pressure (!) 103/52, pulse 95, temperature 99 F (37.2 C), temperature source Oral, resp. rate 18, height  (1.6 m), weight 213 lb (96.6 kg), unknown if currently breastfeeding.  Physical Exam:  General: alert, cooperative and no distress Chest: no respiratory distress Heart:regular rate, distal pulses intact Abdomen: soft, nontender,  Uterine Fundus: firm, appropriately tender DVT Evaluation: No calf swelling or tenderness Extremities: no edema Skin: warm, dry  Recent Labs    09/01/17 1300 09/01/17 2221  HGB 13.5 13.1  HCT 41.2 39.1    Assessment/Plan: Anita Sims is a 22 y.o. G1P1001 s/p NSVD at [redacted]w[redacted]d   PPD#1 - Doing well  Routine postpartum care  Contraception: Undecided Feeding: Breastfeeding Dispo: Plan for discharge 09/02/2017.   LOS: 1 day   Alfonso Ellis Medical Student 09/02/2017, 6:37 AM

## 2017-09-02 NOTE — Plan of Care (Signed)
Progressing appropriately. Admission paperwork and room orientation completed. Patient to bathroom with minimal assistance. Encouraged to call for assistance as needed.

## 2017-09-03 MED ORDER — IBUPROFEN 600 MG PO TABS: 600.0000 mg | ORAL_TABLET | Freq: Four times a day (QID) | ORAL | 0 refills | Status: DC | PRN

## 2017-09-03 NOTE — Discharge Summary (Addendum)
OB Discharge Summary     Patient Name: Anita Sims DOB: Apr 17, 1995 MRN: [id number] Date of admission: 09/01/2017  Delivering MD: Waunita Schooner T )  Date of discharge: 09/03/2017    Admitting diagnosis: 37WKS,WATER BROKE Intrauterine pregnancy: [redacted]w[redacted]d    Secondary diagnosis:  Active Problems:   Patient Active Problem List   Diagnosis Date Noted  . Indication for care in labor or delivery 09/01/2017  . Polysubstance abuse (HCC) 09/01/2017  . SVD (spontaneous vaginal delivery) 09/01/2017  . Elevated BP without diagnosis of hypertension 09/01/2017  . Contraception management 12/31/2012  . Implanon removal 12/31/2012    Additional problems: None     Discharge diagnosis: Term Pregnancy Delivered and Gestational Hypertension                                                                                                Post partum procedures:None  Complications: None  Hospital course:  Onset of Labor With Vaginal Delivery     22 y.o. yo G1P1001 at [redacted]w[redacted]d was admitted in Latent Labor on 09/01/2017. Patient had an uncomplicated labor course as follows:  Membrane Rupture Time/Date: 11:15 AM ,09/01/2017   Intrapartum Procedures: Episiotomy: None [1]                                         Lacerations:  1st degree [2]  Patient had a delivery of a Viable infant. 09/01/2017  Information for the patient's newborn:  Chase, Knebel [[id number]]  Delivery Method: Vaginal, Spontaneous(Filed from Delivery Summary)   Patient had elevated BP during labor.  Pre-eclampsia labs were nml.  Patient had an uncomplicated postpartum course. BP remained normotensive and patient did not have symptoms of HA, vision changes, RUQ/epigastric pain.  She is ambulating, tolerating a regular diet, passing flatus, and urinating well. Patient is discharged home in stable condition on 09/03/17. She had a SW consult while inpt due to a report of +cocaine/THC on a UDS 08/18/17, but that report has not been found  at the time of d/c and so there are no barriers to d/c.   Physical exam  Vitals:   09/02/17 1850 09/03/17 0543  BP: 126/77 132/75  Pulse: 86 71  Resp: 18 18  Temp: 98.6 F (37 C) 97.9 F (36.6 C)    General: alert, cooperative and no distress Lochia: appropriate Uterine Fundus: firm Incision: N/A DVT Evaluation: No evidence of DVT seen on physical exam.  Labs: No results found for this or any previous visit (from the past 24 hour(s)).   Discharge instruction: per After Visit Summary and "Baby and Me Booklet".  After visit meds:  Allergies as of 09/03/2017   No Known Allergies     Medication List    STOP taking these medications   valACYclovir 500 MG tablet Commonly known as:  VALTREX     TAKE these medications   ibuprofen 600 MG tablet Commonly known as:  ADVIL,MOTRIN Take 1 tablet (600 mg total) by mouth every 6 (six) hours as needed.  prenatal multivitamin Tabs tablet Take 1 tablet by mouth daily at 12 noon.        Diet: routine diet  Activity: Advance as tolerated. Pelvic rest for 6 weeks.   Outpatient follow up:1 week BP check by [facility], then 4 wk PP visit Future Appointments: No future appointments.  Follow up Appt: No follow-ups on file.   Postpartum contraception: Undecided  Newborn Data: APGAR (1 MIN): 9   APGAR (5 MINS): 9    Baby Feeding: Breast Disposition:home with mother  Alfonso Ellis, Medical Student  09/03/2017  CNM attestation I have seen and examined this patient and agree with above documentation in the med student's note.   Anita Sims is a 22 y.o. G1P1001 s/p SVD.   Pain is well controlled.  Plan for birth control is unsure.  Method of Feeding: breast  PE:  BP 132/75   Pulse 71   Temp 97.9 F (36.6 C) (Oral)   Resp 18   Ht  (1.6 m)   Wt 96.6 kg (213 lb)   Breastfeeding? Unknown   BMI 37.73 kg/m  Fundus firm  Recent Labs    09/01/17 1300 09/01/17 2221  HGB 13.5 13.1  HCT 41.2 39.1      Plan: discharge today - postpartum care discussed - f/u clinic in 1 week for BP check by Siri Cole, then 4wks for postpartum visit   Cam Hai, CNM 9:00 AM 09/03/2017

## 2017-09-03 NOTE — Discharge Instructions (Signed)
Vaginal Delivery, Care After °Refer to this sheet in the next few weeks. These instructions provide you with information about caring for yourself after vaginal delivery. Your health care provider may also give you more specific instructions. Your treatment has been planned according to current medical practices, but problems sometimes occur. Call your health care provider if you have any problems or questions. °What can I expect after the procedure? °After vaginal delivery, it is common to have: °· Some bleeding from your vagina. °· Soreness in your abdomen, your vagina, and the area of skin between your vaginal opening and your anus (perineum). °· Pelvic cramps. °· Fatigue. ° °Follow these instructions at home: °Medicines °· Take over-the-counter and prescription medicines only as told by your health care provider. °· If you were prescribed an antibiotic medicine, take it as told by your health care provider. Do not stop taking the antibiotic until it is finished. °Driving ° °· Do not drive or operate heavy machinery while taking prescription pain medicine. °· Do not drive for 24 hours if you received a sedative. °Lifestyle °· Do not drink alcohol. This is especially important if you are breastfeeding or taking medicine to relieve pain. °· Do not use tobacco products, including cigarettes, chewing tobacco, or e-cigarettes. If you need help quitting, ask your health care provider. °Eating and drinking °· Drink at least 8 eight-ounce glasses of water every day unless you are told not to by your health care provider. If you choose to breastfeed your baby, you may need to drink more water than this. °· Eat high-fiber foods every day. These foods may help prevent or relieve constipation. High-fiber foods include: °? Whole grain cereals and breads. °? Brown rice. °? Beans. °? Fresh fruits and vegetables. °Activity °· Return to your normal activities as told by your health care provider. Ask your health care provider  what activities are safe for you. °· Rest as much as possible. Try to rest or take a nap when your baby is sleeping. °· Do not lift anything that is heavier than your baby or 10 lb (4.5 kg) until your health care provider says that it is safe. °· Talk with your health care provider about when you can engage in sexual activity. This may depend on your: °? Risk of infection. °? Rate of healing. °? Comfort and desire to engage in sexual activity. °Vaginal Care °· If you have an episiotomy or a vaginal tear, check the area every day for signs of infection. Check for: °? More redness, swelling, or pain. °? More fluid or blood. °? Warmth. °? Pus or a bad smell. °· Do not use tampons or douches until your health care provider says this is safe. °· Watch for any blood clots that may pass from your vagina. These may look like clumps of dark red, brown, or black discharge. °General instructions °· Keep your perineum clean and dry as told by your health care provider. °· Wear loose, comfortable clothing. °· Wipe from front to back when you use the toilet. °· Ask your health care provider if you can shower or take a bath. If you had an episiotomy or a perineal tear during labor and delivery, your health care provider may tell you not to take baths for a certain length of time. °· Wear a bra that supports your breasts and fits you well. °· If possible, have someone help you with household activities and help care for your baby for at least a few days after   you leave the hospital. °· Keep all follow-up visits for you and your baby as told by your health care provider. This is important. °Contact a health care provider if: °· You have: °? Vaginal discharge that has a bad smell. °? Difficulty urinating. °? Pain when urinating. °? A sudden increase or decrease in the frequency of your bowel movements. °? More redness, swelling, or pain around your episiotomy or vaginal tear. °? More fluid or blood coming from your episiotomy or  vaginal tear. °? Pus or a bad smell coming from your episiotomy or vaginal tear. °? A fever. °? A rash. °? Little or no interest in activities you used to enjoy. °? Questions about caring for yourself or your baby. °· Your episiotomy or vaginal tear feels warm to the touch. °· Your episiotomy or vaginal tear is separating or does not appear to be healing. °· Your breasts are painful, hard, or turn red. °· You feel unusually sad or worried. °· You feel nauseous or you vomit. °· You pass large blood clots from your vagina. If you pass a blood clot from your vagina, save it to show to your health care provider. Do not flush blood clots down the toilet without having your health care provider look at them. °· You urinate more than usual. °· You are dizzy or light-headed. °· You have not breastfed at all and you have not had a menstrual period for 12 weeks after delivery. °· You have stopped breastfeeding and you have not had a menstrual period for 12 weeks after you stopped breastfeeding. °Get help right away if: °· You have: °? Pain that does not go away or does not get better with medicine. °? Chest pain. °? Difficulty breathing. °? Blurred vision or spots in your vision. °? Thoughts about hurting yourself or your baby. °· You develop pain in your abdomen or in one of your legs. °· You develop a severe headache. °· You faint. °· You bleed from your vagina so much that you fill two sanitary pads in one hour. °This information is not intended to replace advice given to you by your health care provider. Make sure you discuss any questions you have with your health care provider. °Document Released: 03/28/2000 Document Revised: 09/12/2015 Document Reviewed: 04/15/2015 °Elsevier Interactive Patient Education © 2018 [employer] ° °

## 2017-09-03 NOTE — Lactation Note (Signed)
This note was copied from a baby's chart. Lactation Consultation Note: Mom reports that baby has been nursing well but her left nipple is slightly sore. Reports she just fed but for only 5 min.Dr Kennedy Bucker in to examine baby. Baby awake, offered assist and mom agreeable. Baby took a couple of attempts, then latched well and nursed for 20 min. Did need some stimulation to continue nursing. Mom reports this feels better than before. Reports baby is sliding sometimes to nipple. Encouraged to wait for wide open mouth and keep the baby close to the breast throughout the feeding. No questions at present. Reviewed our phone number, OP appointments and [facility] as resources for support after DC. To call prn  Patient Name: Anita Sims'N Date: 09/03/2017 Reason for consult: Follow-up assessment;Early term 37-38.6wks   Maternal Data Formula Feeding for Exclusion: No Has patient been taught Hand Expression?: Yes Does the patient have breastfeeding experience prior to this delivery?: No  Feeding Feeding Type: Breast Fed Length of feed: 20 min  LATCH Score Latch: Grasps breast easily, tongue down, lips flanged, rhythmical sucking.  Audible Swallowing: A few with stimulation  Type of Nipple: Everted at rest and after stimulation  Comfort (Breast/Nipple): Filling, red/small blisters or bruises, mild/mod discomfort  Hold (Positioning): Assistance needed to correctly position infant at breast and maintain latch.  LATCH Score: 7  Interventions Interventions: Breast feeding basics reviewed;Support pillows;Breast massage;Hand express;Breast compression  Lactation Tools Discussed/Used WIC Program: Yes   Consult Status Consult Status: Complete    [name] 09/03/2017, 10:09 AM

## 2017-09-03 NOTE — Progress Notes (Signed)
Patient ID: Anita Sims, female   DOB: [birth year], 22 y.o.   MRN: [id number] Anita Sims is a 22 y.o. G1P1001 at [redacted]w[redacted]d admitted for rupture of membranes  Subjective: Uncomfortable w/ uc's, doing well. Denies ha, visual changes, ruq/epigastric pain, n/v.    Objective: BP 141/72, Pulse 99, RR 16, Temp 97.6, O2 100%  FHT:  FHR: 140 bpm, variability: moderate,  accelerations:  Present,  decelerations:  Absent UC:   regular, every 1-3 minutes  SVE:  7/90/0, vtx  Pitocin @ 4 mu/min  Labs: Lab Results  Component Value Date   WBC 15.8 (H) 09/01/2017   HGB 13.1 09/01/2017   HCT 39.1 09/01/2017   MCV 88.5 09/01/2017   PLT 239 09/01/2017    Assessment / Plan: Augmentation of labor, progressing well. Few elevated bp's, likely pain related, asymptomatic for pre-e, will check baseline labs  Labor: Progressing normally Fetal Wellbeing:  Category I Pain Control:  IV pain meds Pre-eclampsia: checking labs I/D:  n/a Anticipated MOD:  NSVD  Cheral Marker CNM, WHNP-BC 09/03/2017, 9:45 AM

## 2017-10-20 ENCOUNTER — Ambulatory Visit (HOSPITAL_COMMUNITY)
Admission: EM | Admit: 2017-10-20 | Discharge: 2017-10-20 | Disposition: A | Discharge status: Home / Self Care | Payer: BLUE CROSS/BLUE SHIELD | Attending: Family Medicine | Admitting: Family Medicine

## 2017-10-20 ENCOUNTER — Encounter (HOSPITAL_COMMUNITY): Payer: Self-pay | Admitting: Emergency Medicine

## 2017-10-20 DIAGNOSIS — H1031 Unspecified acute conjunctivitis, right eye: Secondary | ICD-10-CM

## 2017-10-20 MED ORDER — SULFACETAMIDE SODIUM 10 % OP SOLN: 1.0000 [drp] | Freq: Four times a day (QID) | OPHTHALMIC | 0 refills | Status: AC

## 2017-10-20 NOTE — Discharge Instructions (Signed)
Use sulfacetamide eyedrops as directed on right eye. Lid scrubs and warm compresses as directed. Monitor for any worsening of symptoms, changes in vision, sensitivity to light, eye swelling, painful eye movement, follow up with ophthalmology for further evaluation.

## 2017-10-20 NOTE — ED Triage Notes (Signed)
PT reports right eye redness that started yesterday

## 2017-10-20 NOTE — ED Provider Notes (Signed)
[facility]    CSN: [id number] Arrival date & time: 10/20/17  1044     History   Chief Complaint Chief Complaint  Patient presents with  . Conjunctivitis    HPI Anita Sims is a 22 y.o. female.   22 year old female comes in for 2-day history of right eye redness.  States it was slightly sore when symptoms first started, but has resolved.  Denies itching.  Denies vision changes, photophobia.  Wakes up with eye crusted shut.  Has also had some rhinorrhea, nasal congestion, cough.  Denies fever, chills, night sweats.  Denies injury to the eye.  Denies contact lens use.  States she has glasses, but does not wear them.  Has not tried anything for the symptoms.  Former smoker.     Past Medical History:  Diagnosis Date  . Anemia    hx of, but not during pregnancy  . Anxiety    "was on medication long time ago"; pt states  . Bipolar 1 disorder (HCC)    "was on medication a long time ago" pt states  . Chlamydia   . Genital herpes   . Gonorrhea   . Substance abuse [facility])     Patient Active Problem List   Diagnosis Date Noted  . Indication for care in labor or delivery 09/01/2017  . Polysubstance abuse (HCC) 09/01/2017  . SVD (spontaneous vaginal delivery) 09/01/2017  . Elevated BP without diagnosis of hypertension 09/01/2017  . Contraception management 12/31/2012  . Implanon removal 12/31/2012    Past Surgical History:  Procedure Laterality Date  . MOUTH SURGERY      OB History    Gravida  1   Para  1   Term  1   Preterm  0   AB  0   Living  1     SAB  0   TAB  0   Ectopic  0   Multiple  0   Live Births  1            Home Medications    Prior to Admission medications   Medication Sig Start Date End Date Taking? Authorizing Provider  ibuprofen (ADVIL,MOTRIN) 600 MG tablet Take 1 tablet (600 mg total) by mouth every 6 (six) hours as needed. 09/03/17   Arabella Merles, CNM  Prenatal Vit-Fe Fumarate-FA (PRENATAL MULTIVITAMIN)  TABS tablet Take 1 tablet by mouth daily at 12 noon.    [provider]  sulfacetamide (BLEPH-10) 10 % ophthalmic solution Place 1-2 drops into the right eye 4 (four) times daily for 7 days. 10/20/17 10/27/17  Belinda Fisher, PA-C    Family History Family History  Problem Relation Age of Onset  . Diabetes Maternal Grandfather        type II  . Cancer Maternal Grandfather   . Heart disease Maternal Grandmother   . Heart disease Mother     Social History Social History   Tobacco Use  . Smoking status: Former Smoker    Types: Cigars  . Smokeless tobacco: Never Used  . Tobacco comment: quit a couple wks ago  Substance Use Topics  . Alcohol use: Yes    Comment: wkly- used to drink all the time  . Drug use: Yes    Types: Marijuana, Cocaine, Other-see comments    Comment: every other day, last cocaine was 2 wks ago, used to take a lot of xanax and percocet     Allergies   Patient has no known allergies.  Review of Systems Review of Systems  Reason unable to perform ROS: See HPI as above.     Physical Exam Triage Vital Signs ED Triage Vitals  Enc Vitals Group     BP 10/20/17 1057 118/72     Pulse Rate 10/20/17 1057 65     Resp 10/20/17 1057 18     Temp 10/20/17 1057 98.2 F (36.8 C)     Temp Source 10/20/17 1057 Oral     SpO2 --      Weight 10/20/17 1101 180 lb (81.6 kg)     Height --      Head Circumference --      Peak Flow --      Pain Score 10/20/17 1100 0     Pain Loc --      Pain Edu? --      Excl. in GC? --    No data found.  Updated Vital Signs BP 118/72 (BP Location: Left Arm)   Pulse 65   Temp 98.2 F (36.8 C) (Oral)   Resp 18   Wt 180 lb (81.6 kg)   Breastfeeding? No Comment: last menstrual last year  BMI 31.89 kg/m   Visual Acuity Right Eye Distance: 20/30 without corrective lens Left Eye Distance: 20/30 without corrective lens Bilateral Distance: 20/30 without corrective lens  Right Eye Near:   Left Eye Near:    Bilateral Near:      Physical Exam  Constitutional: She is oriented to person, place, and time. She appears well-developed and well-nourished. No distress.  HENT:  Head: Normocephalic and atraumatic.  Right Ear: Tympanic membrane, external ear and ear canal normal. Tympanic membrane is not erythematous and not bulging.  Left Ear: Tympanic membrane, external ear and ear canal normal. Tympanic membrane is not erythematous and not bulging.  Nose: Nose normal. Right sinus exhibits no maxillary sinus tenderness and no frontal sinus tenderness. Left sinus exhibits no maxillary sinus tenderness and no frontal sinus tenderness.  Mouth/Throat: Uvula is midline, oropharynx is clear and moist and mucous membranes are normal.  Eyes: Pupils are equal, round, and reactive to light. EOM and lids are normal. Right conjunctiva is injected. Left conjunctiva is not injected.  No ciliary flush.   Neck: Normal range of motion. Neck supple.  Cardiovascular: Normal rate, regular rhythm and normal heart sounds. Exam reveals no gallop and no friction rub.  No murmur heard. Pulmonary/Chest: Effort normal and breath sounds normal. She has no decreased breath sounds. She has no wheezes. She has no rhonchi. She has no rales.  Lymphadenopathy:    She has no cervical adenopathy.  Neurological: She is alert and oriented to person, place, and time.  Skin: Skin is warm and dry.  Psychiatric: She has a normal mood and affect. Her behavior is normal. Judgment normal.    UC Treatments / Results  Labs (all labs ordered are listed, but only abnormal results are displayed) Labs Reviewed - No data to display  EKG None  Radiology No results found.  Procedures Procedures (including critical care time)  Medications Ordered in UC Medications - No data to display  Initial Impression / Assessment and Plan / UC Course  I have reviewed the triage vital signs and the nursing notes.  Pertinent labs & imaging results that were available during  my care of the patient were reviewed by me and considered in my medical decision making (see chart for details).    Discussed possible viral/allergic conjunctivitis causing symptoms.  However, given unilateral  symptoms, will cover for bacterial conjunctivitis with sulfacetamide.  Other symptomatic treatment discussed.  Return precautions given.  Patient expresses understanding and agrees with plan.  Final Clinical Impressions(s) / UC Diagnoses   Final diagnoses:  Acute conjunctivitis of right eye, unspecified acute conjunctivitis type    ED Prescriptions    Medication Sig Dispense Auth. Provider   sulfacetamide (BLEPH-10) 10 % ophthalmic solution Place 1-2 drops into the right eye 4 (four) times daily for 7 days. 2.8 mL Threasa Alpha, [place] 10/20/17 1121

## 2018-04-14 NOTE — L&D Delivery Note (Addendum)
[facility] Delivery Note  Anita Sims is a 23 y.o. [id number] s/p uncomplicated SVD at [redacted]w[redacted]d. She was admitted for active labor, SROM.   ROM: 4h 21m with reportedly clear fluid GBS Status: negative Maximum Maternal Temperature: 98.7  Labor Progress: . Presented after SROM 8/3 @2330  at home to the MAU and was found to be 6/80/-2. Progressed to complete without augmentation.  Delivery Date/Time: 11/16/2018 @0346  Delivery: Called to room and patient was complete and pushing. Head delivered LOA. No nuchal cord present. Shoulder and body delivered in usual fashion. Infant with spontaneous cry, placed on mother's abdomen, dried and stimulated. Cord clamped x 2 after 1-minute delay, and cut by MGM. Cord blood drawn. Placenta delivered spontaneously with gentle cord traction. Fundus firm with massage and Pitocin. Labia, perineum, vagina, and cervix inspected inspected with no lacerations.   Placenta: spontaneous, intact, 3v cord Complications: none Lacerations: none EBL: 100 Analgesia: CSE  Postpartum Planning [x]  message to sent to schedule follow-up  [x]  vaccines UTD [ ]  discuss post partum contraception  Infant: vigorous female  APGARs 9/9  weight pending  Corliss Blacker, PGY-III Family Medicine  I was gloved and present for entire delivery SVD without incident No difficulty with shoulders No lacerations  Patient receives care with [facility] and will coordinate postpartum care with her provider there.  Mallie Snooks, CNM 11/16/18 4:14 AM

## 2018-04-15 ENCOUNTER — Inpatient Hospital Stay (HOSPITAL_COMMUNITY)
Admission: AD | Admit: 2018-04-15 | Discharge: 2018-04-15 | Disposition: A | Discharge status: Home / Self Care | Payer: BLUE CROSS/BLUE SHIELD | Attending: Obstetrics & Gynecology | Admitting: Obstetrics & Gynecology

## 2018-04-15 ENCOUNTER — Ambulatory Visit (INDEPENDENT_AMBULATORY_CARE_PROVIDER_SITE_OTHER)
Admission: EM | Admit: 2018-04-15 | Discharge: 2018-04-15 | Disposition: A | Discharge status: Home / Self Care | Payer: BLUE CROSS/BLUE SHIELD | Source: Home / Self Care

## 2018-04-15 ENCOUNTER — Other Ambulatory Visit: Payer: Self-pay

## 2018-04-15 ENCOUNTER — Inpatient Hospital Stay (HOSPITAL_COMMUNITY): Payer: BLUE CROSS/BLUE SHIELD

## 2018-04-15 ENCOUNTER — Encounter (HOSPITAL_COMMUNITY): Payer: Self-pay | Admitting: *Deleted

## 2018-04-15 DIAGNOSIS — Z87891 Personal history of nicotine dependence: Secondary | ICD-10-CM | POA: Diagnosis not present

## 2018-04-15 DIAGNOSIS — Z3491 Encounter for supervision of normal pregnancy, unspecified, first trimester: Secondary | ICD-10-CM

## 2018-04-15 DIAGNOSIS — O219 Vomiting of pregnancy, unspecified: Secondary | ICD-10-CM | POA: Diagnosis not present

## 2018-04-15 DIAGNOSIS — Z3201 Encounter for pregnancy test, result positive: Secondary | ICD-10-CM

## 2018-04-15 DIAGNOSIS — Z3A08 8 weeks gestation of pregnancy: Secondary | ICD-10-CM | POA: Diagnosis not present

## 2018-04-15 DIAGNOSIS — O26891 Other specified pregnancy related conditions, first trimester: Secondary | ICD-10-CM | POA: Insufficient documentation

## 2018-04-15 DIAGNOSIS — R109 Unspecified abdominal pain: Secondary | ICD-10-CM | POA: Diagnosis present

## 2018-04-15 DIAGNOSIS — O210 Mild hyperemesis gravidarum: Secondary | ICD-10-CM | POA: Insufficient documentation

## 2018-04-15 LAB — URINALYSIS, ROUTINE W REFLEX MICROSCOPIC
Bilirubin Urine: NEGATIVE
Glucose, UA: NEGATIVE mg/dL
Hgb urine dipstick: NEGATIVE
Ketones, ur: NEGATIVE mg/dL
Nitrite: NEGATIVE
PROTEIN: NEGATIVE mg/dL
Specific Gravity, Urine: 1.006 (ref 1.005–1.030)
pH: 6 (ref 5.0–8.0)

## 2018-04-15 LAB — CBC
HCT: 38.8 % (ref 36.0–46.0)
Hemoglobin: 12.9 g/dL (ref 12.0–15.0)
MCH: 28.3 pg (ref 26.0–34.0)
MCHC: 33.2 g/dL (ref 30.0–36.0)
MCV: 85.1 fL (ref 80.0–100.0)
Platelets: 236 10*3/uL (ref 150–400)
RBC: 4.56 MIL/uL (ref 3.87–5.11)
RDW: 14.2 % (ref 11.5–15.5)
WBC: 8.5 10*3/uL (ref 4.0–10.5)
nRBC: 0 % (ref 0.0–0.2)

## 2018-04-15 LAB — HCG, QUANTITATIVE, PREGNANCY: HCG, BETA CHAIN, QUANT, S: 257536 m[IU]/mL — AB (ref <5)

## 2018-04-15 LAB — WET PREP, GENITAL
Clue Cells Wet Prep HPF POC: NONE SEEN
Sperm: NONE SEEN
Trich, Wet Prep: NONE SEEN
Yeast Wet Prep HPF POC: NONE SEEN

## 2018-04-15 LAB — POCT PREGNANCY, URINE: Preg Test, Ur: POSITIVE — AB

## 2018-04-15 MED ORDER — METOCLOPRAMIDE HCL 10 MG PO TABS: 10.0000 mg | ORAL_TABLET | Freq: Four times a day (QID) | ORAL | 0 refills | Status: DC

## 2018-04-15 MED ORDER — METRONIDAZOLE 500 MG PO TABS: 500.0000 mg | ORAL_TABLET | Freq: Two times a day (BID) | ORAL | 0 refills | Status: DC

## 2018-04-15 NOTE — MAU Provider Note (Signed)
Chief Complaint: Vaginal Discharge and Abdominal Cramping   First Provider Initiated Contact with Patient 04/15/18 1703     SUBJECTIVE HPI: Anita Sims is a 23 y.o. G2P1001 at [redacted]w[redacted]d who presents to [facility] Admissions reporting vaginal discharge and abdominal cramping. Symptoms began last week. Reports white foul smelling discharge. No itching or irritation. Lower abdomen feels like menstrual cramps. Denies vaginal bleeding. Denies n/v/d, dysuria, or constipation  Location: lower abdomen Quality: cramping Severity: 3/10 on pain scale Duration: 1 week Timing: intermittent Modifying factors: none Associated signs and symptoms: vaginal discharge  Past Medical History:  Diagnosis Date  . Anemia    hx of, but not during pregnancy  . Anxiety    "was on medication long time ago"; pt states  . Bipolar 1 disorder (HCC)    "was on medication a long time ago" pt states  . Chlamydia   . Genital herpes   . Gonorrhea   . Substance abuse (HCC)    OB History  Gravida Para Term Preterm AB Living  2 1 1  0 0 1  SAB TAB Ectopic Multiple Live Births  0 0 0 0 1    # Outcome Date GA Lbr Len/2nd Weight Sex Delivery Anes PTL Lv  2 Current           1 Term 09/01/17 [redacted]w[redacted]d 02:21 / 00:12 3181 g F Vag-Spont None  LIV   Past Surgical History:  Procedure Laterality Date  . MOUTH SURGERY     Social History   Socioeconomic History  . Marital status: Single    Spouse name: Not on file  . Number of children: Not on file  . Years of education: Not on file  . Highest education level: Not on file  Occupational History  . Not on file  Social Needs  . Financial resource strain: Not on file  . Food insecurity:    Worry: Not on file    Inability: Not on file  . Transportation needs:    Medical: Not on file    Non-medical: Not on file  Tobacco Use  . Smoking status: Former Smoker    Types: Cigars  . Smokeless tobacco: Never Used  . Tobacco comment: Hookah  Substance and Sexual Activity   . Alcohol use: Not Currently  . Drug use: Not Currently    Types: Marijuana  . Sexual activity: Yes    Partners: Male    Birth control/protection: None  Lifestyle  . Physical activity:    Days per week: Not on file    Minutes per session: Not on file  . Stress: Not on file  Relationships  . Social connections:    Talks on phone: Not on file    Gets together: Not on file    Attends religious service: Not on file    Active member of club or organization: Not on file    Attends meetings of clubs or organizations: Not on file    Relationship status: Not on file  . Intimate partner violence:    Fear of current or ex partner: Not on file    Emotionally abused: Not on file    Physically abused: Not on file    Forced sexual activity: Not on file  Other Topics Concern  . Not on file  Social History Narrative  . Not on file   Family History  Problem Relation Age of Onset  . Diabetes Maternal Grandfather        type II  . Cancer Maternal  Grandfather   . Heart disease Maternal Grandmother   . Heart disease Mother    No current facility-administered medications on file prior to encounter.    Current Outpatient Medications on File Prior to Encounter  Medication Sig Dispense Refill  . Prenatal Vit-Fe Fumarate-FA (PRENATAL MULTIVITAMIN) TABS tablet Take 1 tablet by mouth daily at 12 noon.     No Known Allergies  I have reviewed patient's Past Medical Hx, Surgical Hx, Family Hx, Social Hx, medications and allergies.   Review of Systems  Constitutional: Negative.   Gastrointestinal: Positive for abdominal pain. Negative for constipation, diarrhea, nausea and vomiting.  Genitourinary: Positive for vaginal discharge. Negative for dysuria and vaginal bleeding.    OBJECTIVE Patient Vitals for the past 24 hrs:  BP Temp Temp src Pulse Resp SpO2 Weight  04/15/18 1825 110/68 - - 76 16 - -  04/15/18 1627 124/70 98.1 F (36.7 C) Oral 65 16 100 % 80.4 kg   Constitutional:  Well-developed, well-nourished female in no acute distress.  Cardiovascular: normal rate & rhythm, no murmur Respiratory: normal rate and effort. Lung sounds clear throughout GI: Abd soft, non-tender, Pos BS x 4. No guarding or rebound tenderness MS: Extremities nontender, no edema, normal ROM Neurologic: Alert and oriented x 4.     LAB RESULTS Results for orders placed or performed during the hospital encounter of 04/15/18 (from the past 24 hour(s))  Urinalysis, Routine w reflex microscopic     Status: Abnormal   Collection Time: 04/15/18  4:35 PM  Result Value Ref Range   Color, Urine YELLOW YELLOW   APPearance HAZY (A) CLEAR   Specific Gravity, Urine 1.006 1.005 - 1.030   pH 6.0 5.0 - 8.0   Glucose, UA NEGATIVE NEGATIVE mg/dL   Hgb urine dipstick NEGATIVE NEGATIVE   Bilirubin Urine NEGATIVE NEGATIVE   Ketones, ur NEGATIVE NEGATIVE mg/dL   Protein, ur NEGATIVE NEGATIVE mg/dL   Nitrite NEGATIVE NEGATIVE   Leukocytes, UA MODERATE (A) NEGATIVE   RBC / HPF 0-5 0 - 5 RBC/hpf   WBC, UA 6-10 0 - 5 WBC/hpf   Bacteria, UA RARE (A) NONE SEEN   Squamous Epithelial / LPF 11-20 0 - 5  CBC     Status: None   Collection Time: 04/15/18  4:59 PM  Result Value Ref Range   WBC 8.5 4.0 - 10.5 K/uL   RBC 4.56 3.87 - 5.11 MIL/uL   Hemoglobin 12.9 12.0 - 15.0 g/dL   HCT 25.338.8 66.436.0 - 40.346.0 %   MCV 85.1 80.0 - 100.0 fL   MCH 28.3 26.0 - 34.0 pg   MCHC 33.2 30.0 - 36.0 g/dL   RDW 47.414.2 25.911.5 - 56.315.5 %   Platelets 236 150 - 400 K/uL   nRBC 0.0 0.0 - 0.2 %  hCG, quantitative, pregnancy     Status: Abnormal   Collection Time: 04/15/18  4:59 PM  Result Value Ref Range   hCG, Beta Chain, Quant, S 257,536 (H) <5 mIU/mL  Wet prep, genital     Status: Abnormal   Collection Time: 04/15/18  5:13 PM  Result Value Ref Range   Yeast Wet Prep HPF POC NONE SEEN NONE SEEN   Trich, Wet Prep NONE SEEN NONE SEEN   Clue Cells Wet Prep HPF POC NONE SEEN NONE SEEN   WBC, Wet Prep HPF POC MANY (A) NONE SEEN   Sperm  NONE SEEN     IMAGING [place] Ob Comp Less 14 Wks  Result Date: 04/15/2018 CLINICAL DATA:  Abdominal pain EXAM: OBSTETRIC <14 WK [place] AND TRANSVAGINAL OB US TECHNIQUE: Both transabdominal and transvaginal ultrasound examinations were performed for complete evaluation of the gestation as well as the maternal uterus, adnexal regions, and pelvic cul-de-sac. Transvaginal technique was performed to assess early pregnancy. COMPARISON:  None. FINDINGS: Intrauterine gestational sac: Single intrauterine pregnancy Yolk sac:  Visible Embryo:  Visible Cardiac Activity: Visible Heart Rate: 154 bpm CRL: 16 mm   7 w   6 d                  [place] EDC: 11/26/2018 Subchorionic hemorrhage:  None visualized. Maternal uterus/adnexae: Ovaries are within normal limits. The right ovary measures 5.4 x 3.3 x 3.4 cm. The left ovary measures 2 x 3.2 x 1.4 cm. No significant free fluid IMPRESSION: Single viable intrauterine pregnancy as above. No specific abnormality is seen. Electronically Signed   By: Jasmine Pang M.D.   On: 04/15/2018 17:46    MAU COURSE Orders Placed This Encounter  Procedures  . Wet prep, genital  . US OB Comp Less 14 Wks  . Urinalysis, Routine w reflex microscopic  . CBC  . hCG, quantitative, pregnancy  . Discharge patient   Meds ordered this encounter  Medications  . metroNIDAZOLE (FLAGYL) 500 MG tablet    Sig: Take 1 tablet (500 mg total) by mouth 2 (two) times daily.    Dispense:  14 tablet    Refill:  0    Order Specific Question:   Supervising Provider    Answer:   Jaynie Collins A [[id number]]  . metoCLOPramide (REGLAN) 10 MG tablet    Sig: Take 1 tablet (10 mg total) by mouth every 6 (six) hours.    Dispense:  30 tablet    Refill:  0    Order Specific Question:   Supervising Provider    Answer:   Jaynie Collins A [[id number]]    MDM +UPT UA, wet prep, GC/chlamydia, CBC, ABO/Rh, quant hCG, and [place] today to rule out ectopic pregnancy Ultrasound shows live IUP c/w LMP dating  ASSESSMENT 1. Normal  IUP (intrauterine pregnancy) on prenatal ultrasound, first trimester   2. Abdominal pain during pregnancy in first trimester   3. Nausea and vomiting during pregnancy prior to [redacted] weeks gestation     PLAN Discharge home in stable condition. Start prenatal care  Allergies as of 04/15/2018   No Known Allergies     Medication List    STOP taking these medications   ibuprofen 600 MG tablet Commonly known as:  ADVIL,MOTRIN     TAKE these medications   metoCLOPramide 10 MG tablet Commonly known as:  REGLAN Take 1 tablet (10 mg total) by mouth every 6 (six) hours.   metroNIDAZOLE 500 MG tablet Commonly known as:  FLAGYL Take 1 tablet (500 mg total) by mouth 2 (two) times daily.   prenatal multivitamin Tabs tablet Take 1 tablet by mouth daily at 12 noon.        Judeth Horn, NP 04/15/2018  9:32 PM

## 2018-04-15 NOTE — Discharge Instructions (Signed)
[place] for [place] at [facility]       Phone: [phone]  [facility] [place] [facility] [place] Phone: [place] for [place] at [place]  Phone: [place] for [place] at [facility]  Phone: [place] for [place] at [place]  Phone: [facility]       Phone: [phone]  [facility]    Phone: [phone]   [facility])    Phone: [facility]    Phone: [phone]  [facility]    Phone: [place] [facility]  Phone: [place]    Phone: [phone]  [facility]   Phone: [phone]  [facility]    Phone: [phone]      Morning Sickness  Morning sickness is when a woman feels nauseous during pregnancy. This nauseous feeling may or may not come with vomiting. It often occurs in the morning, but it can be a problem at any time of day. Morning sickness is most common during the first trimester. In some cases, it may continue throughout pregnancy. Although morning sickness is unpleasant, it is usually harmless unless the woman develops severe and continual vomiting (hyperemesis gravidarum), a condition that requires more intense treatment. What are the causes? The exact cause of this condition is not known, but it seems to be related to normal hormonal changes that occur in pregnancy. What increases the risk? You are more likely to develop this condition if:  You experienced nausea or vomiting before your pregnancy.  You had morning sickness during a previous pregnancy.  You are pregnant with more than one baby, such as twins. What are the signs or symptoms? Symptoms of this condition  include:  Nausea.  Vomiting. How is this diagnosed? This condition is usually diagnosed based on your signs and symptoms. How is this treated? In many cases, treatment is not needed for this condition. Making some changes to what you eat may help to control symptoms. Your health care provider may also prescribe or recommend:  Vitamin B6 supplements.  Anti-nausea medicines.  Ginger. Follow these instructions at home: Medicines  Take over-the-counter and prescription medicines only as told by your health care provider. Do not use any prescription, over-the-counter, or herbal medicines for morning sickness without first talking with your health care provider.  Taking multivitamins before getting pregnant can prevent or decrease the severity of morning sickness in most women. Eating and drinking  Eat a piece of dry toast or crackers before getting out of bed in the morning.  Eat 5 or 6 small meals a day.  Eat dry and bland foods, such as rice or a baked potato. Foods that are high in carbohydrates are often helpful.  Avoid greasy, fatty, and spicy foods.  Have someone cook for you if the smell of any food causes nausea and vomiting.  If you feel nauseous after taking prenatal vitamins, take the vitamins at night or with a snack.  Snack on protein foods between meals if you are hungry. Nuts, yogurt, and cheese are good options.  Drink fluids throughout the day.  Try ginger ale made with real ginger, ginger tea made from fresh grated ginger, or ginger candies. General instructions  Do not use any products that contain nicotine or tobacco, such as cigarettes and e-cigarettes. If  you need help quitting, ask your health care provider. °· Get an air purifier to keep the air in your house free of odors. °· Get plenty of fresh air. °· Try to avoid odors that trigger your nausea. °· Consider trying these methods to help relieve symptoms: °? Wearing an acupressure wristband. These  wristbands are often worn for seasickness. °? Acupuncture. °Contact a health care provider if: °· Your home remedies are not working and you need medicine. °· You feel dizzy or light-headed. °· You are losing weight. °Get help right away if: °· You have persistent and uncontrolled nausea and vomiting. °· You faint. °· You have severe pain in your abdomen. °Summary °· Morning sickness is when a woman feels nauseous during pregnancy. This nauseous feeling may or may not come with vomiting. °· Morning sickness is most common during the first trimester. °· It often occurs in the morning, but it can be a problem at any time of day. °· In many cases, treatment is not needed for this condition. Making some changes to what you eat may help to control symptoms. °This information is not intended to replace advice given to you by your health care provider. Make sure you discuss any questions you have with your health care provider. °Document Released: 05/22/2006 Document Revised: 05/03/2016 Document Reviewed: 05/03/2016 °Elsevier Interactive Patient Education © 2019 Elsevier Inc. ° °

## 2018-04-15 NOTE — MAU Note (Addendum)
Feels like she has BV, cause she can smell herself, also maybe has a yeast infection.  No itching, just whenever she has one, she has the other. preg confirmed at HD

## 2018-04-15 NOTE — ED Notes (Signed)
Pt was sent to [facility] per provider [facility] after urine pregnancy test was performed and produced positive.  Pt complained of abdominal cramping.

## 2018-04-16 LAB — GC/CHLAMYDIA PROBE AMP (~~LOC~~) NOT AT ARMC
Chlamydia: NEGATIVE
Neisseria Gonorrhea: NEGATIVE

## 2018-05-22 ENCOUNTER — Inpatient Hospital Stay (HOSPITAL_COMMUNITY)
Admission: AD | Admit: 2018-05-22 | Discharge: 2018-05-23 | Disposition: A | Discharge status: Home / Self Care | Payer: BLUE CROSS/BLUE SHIELD | Attending: Obstetrics and Gynecology | Admitting: Obstetrics and Gynecology

## 2018-05-22 DIAGNOSIS — O26891 Other specified pregnancy related conditions, first trimester: Secondary | ICD-10-CM | POA: Insufficient documentation

## 2018-05-22 DIAGNOSIS — Z87891 Personal history of nicotine dependence: Secondary | ICD-10-CM | POA: Insufficient documentation

## 2018-05-22 DIAGNOSIS — K529 Noninfective gastroenteritis and colitis, unspecified: Secondary | ICD-10-CM

## 2018-05-22 DIAGNOSIS — A059 Bacterial foodborne intoxication, unspecified: Secondary | ICD-10-CM | POA: Insufficient documentation

## 2018-05-22 DIAGNOSIS — Z3A13 13 weeks gestation of pregnancy: Secondary | ICD-10-CM | POA: Insufficient documentation

## 2018-05-23 ENCOUNTER — Other Ambulatory Visit: Payer: Self-pay

## 2018-05-23 ENCOUNTER — Encounter (HOSPITAL_COMMUNITY): Payer: Self-pay | Admitting: Emergency Medicine

## 2018-05-23 DIAGNOSIS — K529 Noninfective gastroenteritis and colitis, unspecified: Secondary | ICD-10-CM | POA: Diagnosis not present

## 2018-05-23 DIAGNOSIS — Z3A13 13 weeks gestation of pregnancy: Secondary | ICD-10-CM

## 2018-05-23 DIAGNOSIS — O26891 Other specified pregnancy related conditions, first trimester: Secondary | ICD-10-CM

## 2018-05-23 DIAGNOSIS — O210 Mild hyperemesis gravidarum: Secondary | ICD-10-CM | POA: Diagnosis present

## 2018-05-23 DIAGNOSIS — Z87891 Personal history of nicotine dependence: Secondary | ICD-10-CM | POA: Diagnosis not present

## 2018-05-23 DIAGNOSIS — A059 Bacterial foodborne intoxication, unspecified: Secondary | ICD-10-CM | POA: Diagnosis not present

## 2018-05-23 MED ORDER — METOCLOPRAMIDE HCL 10 MG PO TABS
10.0000 mg | ORAL_TABLET | Freq: Once | ORAL | Status: AC
  Administered 2018-05-23: 10 mg via ORAL
  Filled 2018-05-23: qty 1

## 2018-05-23 MED ORDER — LOPERAMIDE HCL 2 MG PO CAPS
4.0000 mg | ORAL_CAPSULE | Freq: Once | ORAL | Status: AC
  Administered 2018-05-23: 4 mg via ORAL
  Filled 2018-05-23: qty 2

## 2018-05-23 NOTE — MAU Provider Note (Signed)
History     CSN: [id number]  Arrival date and time: 05/22/18 2350   First Provider Initiated Contact with Patient 05/23/18 0034      Chief Complaint  Patient presents with  . Nausea  . Emesis  . Diarrhea  . Chills   Anita Sims is a 23 y.o. G2P1001 at [redacted]w[redacted]d who presents for Nausea; Emesis; Diarrhea; and Chills.  She states her symptoms started about 2 hours ago (2230).  She reports stomach pain s/t the vomiting, but is otherwise without pain.  She denies sickness in the household and has not had known contact with sick individuals.  She reports all the foods consumed were made at home with notable exceptions. She reports that she has not taken anything for her symptoms.  Patient denies vaginal bleeding and endorses some intermittent abdominal cramping.   24hr Recall Cereal; Cheerios Milk  Lunchable; Pizza and Nacho/Cheese  Dinner Chicken wings Okra French Donzetta Sprung  Brownie Ice Cream     OB History    Gravida  2   Para  1   Term  1   Preterm  0   AB  0   Living  1     SAB  0   TAB  0   Ectopic  0   Multiple  0   Live Births  1           Past Medical History:  Diagnosis Date  . Anemia    hx of, but not during pregnancy  . Anxiety    "was on medication long time ago"; pt states  . Bipolar 1 disorder (HCC)    "was on medication a long time ago" pt states  . Chlamydia   . Genital herpes   . Gonorrhea   . Substance abuse [facility])     Past Surgical History:  Procedure Laterality Date  . MOUTH SURGERY      Family History  Problem Relation Age of Onset  . Diabetes Maternal Grandfather        type II  . Cancer Maternal Grandfather   . Heart disease Maternal Grandmother   . Heart disease Mother     Social History   Tobacco Use  . Smoking status: Former Smoker    Types: Cigars  . Smokeless tobacco: Never Used  . Tobacco comment: Hookah  Substance Use Topics  . Alcohol use: Not Currently  . Drug use: Not Currently    Types:  Marijuana    Allergies: No Known Allergies  Medications Prior to Admission  Medication Sig Dispense Refill Last Dose  . metoCLOPramide (REGLAN) 10 MG tablet Take 1 tablet (10 mg total) by mouth every 6 (six) hours. 30 tablet 0   . metroNIDAZOLE (FLAGYL) 500 MG tablet Take 1 tablet (500 mg total) by mouth 2 (two) times daily. 14 tablet 0   . Prenatal Vit-Fe Fumarate-FA (PRENATAL MULTIVITAMIN) TABS tablet Take 1 tablet by mouth daily at 12 noon.   More than a month at Unknown time    Review of Systems  Constitutional: Positive for chills. Negative for fever.  HENT: Negative for rhinorrhea.   Respiratory: Negative for cough.   Gastrointestinal: Positive for diarrhea and vomiting. Negative for nausea.  Genitourinary: Negative for dysuria and vaginal bleeding.  Neurological: Positive for headaches. Negative for dizziness and light-headedness (Left side).   Physical Exam   Blood pressure 123/73, pulse (!) 115, temperature 97.7 F (36.5 C), temperature source Oral, weight 78.1 kg, last menstrual period 02/17/2018, not currently  breastfeeding.  Physical Exam  Constitutional: She is oriented to person, place, and time.  Musculoskeletal: Normal range of motion.        General: No edema.  Neurological: She is alert and oriented to person, place, and time.  Skin: Skin is warm and dry.    MAU Course  Procedures  MDM Assessment Contact Precautions Labs: UA Assessment and Plan  Food Poisoning  -Discussed possibility of food poisoning or other GI viral infection -Will give Reglan and Imodium  -Patient tolerating oral hydration at current -Will reassess after completion of medications  Follow Up (2:37 AM)  -Patient reports improvement in symptoms with medications -Educated on process of GI infections including time of recovery and treatment. -Educated and instructed to initiate BRAT Diet. -Informed that symptoms should be drastically improved by Monday and to return if  otherwise. -Encouraged continued oral hydration and treatment of symptoms. -Patient declines RX for imodium, informed available OTC -Reports will start care at [facility] on Tuesday -Instructed to remain out of work until after evaluation to avoid spreading virus if not related to food consumption. -OOW excuse given -Encouraged to call or return to MAU if symptoms worsen or with the onset of new symptoms. -Discharged to home in improved condition   Cherre Robins MSN, CNM 05/23/2018, 12:34 AM

## 2018-05-23 NOTE — MAU Note (Addendum)
Pt reports to MAU c/o NVD that started 2 hours ago. Pt reports this is a sudden onset and she has vomited 4x and had diarrhea 3x. No bleeding or LOF. Pt reports a rash that started "just now."

## 2018-05-23 NOTE — Discharge Instructions (Signed)
Food Poisoning  Food poisoning is an illness that is caused by eating or drinking contaminated foods or drinks. In most cases, food poisoning is mild and lasts 1-2 days. However, some cases can be serious, especially for people who have weak body defense (immune) systems, older people, children and infants, and pregnant women.  What are the causes?  Foods can become contaminated with viruses, bacteria, parasites, mold, or chemicals as a result of:   Poor personal hygiene, such as poor hand washing practices.   Storing food improperly, such as not refrigerating raw meat.   Using unclean surfaces for serving, preparing, and storing food.   Cooking or eating with unclean utensils.  If contaminated food is eaten, viruses, bacteria, or parasites can harm the intestine. This often causes severe diarrhea. The most common causes of food poisoning include:   Viruses, such as:  ? Norovirus.  ? Rotavirus.   Bacteria, such as:  ? Salmonella.  ? Listeria.  ? E. coli (Escherichia coli).   Parasites, such as:  ? Giardia.  ? Toxoplasmosis.  What are the signs or symptoms?  Symptoms may take several hours to appear after you consume contaminated food or drink. Symptoms include:   Nausea.   Vomiting.   Cramping.   Diarrhea.   Fever and chills.   Muscle aches.   Dehydration. Dehydration can cause you to be tired and thirsty, have a dry mouth, and urinate less frequently.  How is this diagnosed?  Your health care provider can diagnose food poisoning with a medical history and physical exam. This will include asking you what you have recently eaten. You may also have tests, including:   Blood tests.   Stool tests.  How is this treated?  Treatment focuses on relieving your symptoms and making sure that you are hydrated. You may also be given medicines. In severe cases, hospitalization may be required and you may need to receive fluids through an IV tube.  Follow these instructions at home:  Eating and drinking     Drink  enough fluids to keep your urine clear or pale yellow. You may need to drink small amounts of clear liquids frequently.   Avoid milk, caffeine, and alcohol.   Ask your health care provider for specific rehydration instructions.   Eat small, frequent meals rather than large meals.  Medicines   Take over-the-counter and prescription medicines only as told by your health care provider. Ask your health care provider if you should continue to take any of your regular prescribed and over-the-counter medicines.   If you were prescribed an antibiotic medicine, take it as told by your health care provider. Do not stop taking the antibiotic even if you start to feel better.  General instructions   Wash your hands thoroughly before you prepare food and after you go to the bathroom (use the toilet). Make sure people who live with you also wash their hands often.   Clean surfaces that you touch with a product that contains chlorine bleach.   Keep all follow-up visits as told by your health care provider. This is important.  How is this prevented?   Wash your hands, food preparation surfaces, and utensils thoroughly before and after you handle raw foods.   Use separate food preparation surfaces and storage spaces for raw meat and for fruits and vegetables.   Keep refrigerated foods colder than 40F (5C).   Serve hot foods immediately or keep them heated above 140F (60C).   Store dry foods   in cool, dry spaces away from excess heat or moisture. Throw out any foods that do not smell right or are in cans that are bulging.   Follow approved canning procedures.   Heat canned foods thoroughly before you taste them.   Drink bottled or sterile water when you travel.  Get help right away if:  Call 911 or go to the emergency room if:   You have difficulty breathing, swallowing, talking, or moving.   You develop blurred vision.   You cannot eat or drink without vomiting.   You faint.   Your eyes turn yellow.   Your  vomiting or diarrhea is persistent.   Abdominal pain develops, increases, or localizes in one small area.   You have a fever.   You have blood or mucus in your stools, or your stools look dark black and tarry.   You have signs of dehydration, such as:  ? Dark urine, very little urine, or no urine.  ? Cracked lips.  ? Not making tears while crying.  ? Dry mouth.  ? Sunken eyes.  ? Sleepiness.  ? Weakness.  ? Dizziness.  This information is not intended to replace advice given to you by your health care provider. Make sure you discuss any questions you have with your health care provider.  Document Released: 12/28/2003 Document Revised: 08/28/2015 Document Reviewed: 10/02/2014  Elsevier Interactive Patient Education  2019 Elsevier Inc.

## 2018-06-04 LAB — OB RESULTS CONSOLE RPR: RPR: NONREACTIVE

## 2018-06-04 LAB — OB RESULTS CONSOLE HEPATITIS B SURFACE ANTIGEN: Hepatitis B Surface Ag: NEGATIVE

## 2018-06-04 LAB — OB RESULTS CONSOLE RUBELLA ANTIBODY, IGM: Rubella: IMMUNE

## 2018-07-30 ENCOUNTER — Inpatient Hospital Stay (HOSPITAL_COMMUNITY)
Admission: AD | Admit: 2018-07-30 | Discharge: 2018-07-30 | Disposition: A | Discharge status: Home / Self Care | Payer: BLUE CROSS/BLUE SHIELD | Attending: Obstetrics and Gynecology | Admitting: Obstetrics and Gynecology

## 2018-07-30 ENCOUNTER — Other Ambulatory Visit: Payer: Self-pay

## 2018-07-30 DIAGNOSIS — N760 Acute vaginitis: Secondary | ICD-10-CM | POA: Diagnosis not present

## 2018-07-30 DIAGNOSIS — O23592 Infection of other part of genital tract in pregnancy, second trimester: Secondary | ICD-10-CM | POA: Diagnosis not present

## 2018-07-30 DIAGNOSIS — B9689 Other specified bacterial agents as the cause of diseases classified elsewhere: Secondary | ICD-10-CM | POA: Insufficient documentation

## 2018-07-30 DIAGNOSIS — Z3A23 23 weeks gestation of pregnancy: Secondary | ICD-10-CM | POA: Diagnosis not present

## 2018-07-30 DIAGNOSIS — R109 Unspecified abdominal pain: Secondary | ICD-10-CM | POA: Diagnosis not present

## 2018-07-30 DIAGNOSIS — Z87891 Personal history of nicotine dependence: Secondary | ICD-10-CM | POA: Diagnosis not present

## 2018-07-30 DIAGNOSIS — F319 Bipolar disorder, unspecified: Secondary | ICD-10-CM | POA: Diagnosis not present

## 2018-07-30 DIAGNOSIS — O26892 Other specified pregnancy related conditions, second trimester: Secondary | ICD-10-CM | POA: Diagnosis not present

## 2018-07-30 DIAGNOSIS — O99342 Other mental disorders complicating pregnancy, second trimester: Secondary | ICD-10-CM | POA: Diagnosis not present

## 2018-07-30 DIAGNOSIS — Z79899 Other long term (current) drug therapy: Secondary | ICD-10-CM | POA: Diagnosis not present

## 2018-07-30 LAB — WET PREP, GENITAL
Sperm: NONE SEEN
Trich, Wet Prep: NONE SEEN
Yeast Wet Prep HPF POC: NONE SEEN

## 2018-07-30 MED ORDER — METRONIDAZOLE 500 MG PO TABS: 500.0000 mg | ORAL_TABLET | Freq: Two times a day (BID) | ORAL | 0 refills | Status: AC

## 2018-07-30 NOTE — MAU Provider Note (Signed)
Chief Complaint: Vaginal Discharge and Abdominal Pain   None     SUBJECTIVE HPI: Anita Sims is a 23 y.o. G2P1001 at 1984w2d who [facility] who presents to maternity admissions reporting vaginal discharge with odor, like previous episodes of BV. She reports occasional mild cramps, less than 2-3 times daily.  There are no other symptoms.  There is normal fetal movement.     HPI  Past Medical History:  Diagnosis Date  . Anemia    hx of, but not during pregnancy  . Anxiety    "was on medication long time ago"; pt states  . Bipolar 1 disorder (HCC)    "was on medication a long time ago" pt states  . Chlamydia   . Genital herpes   . Gonorrhea   . Substance abuse [facility](HCC)    Past Surgical History:  Procedure Laterality Date  . MOUTH SURGERY     Social History   Socioeconomic History  . Marital status: Single    Spouse name: Not on file  . Number of children: Not on file  . Years of education: Not on file  . Highest education level: Not on file  Occupational History  . Not on file  Social Needs  . Financial resource strain: Not on file  . Food insecurity:    Worry: Not on file    Inability: Not on file  . Transportation needs:    Medical: Not on file    Non-medical: Not on file  Tobacco Use  . Smoking status: Former Smoker    Types: Cigars  . Smokeless tobacco: Never Used  . Tobacco comment: Hookah  Substance and Sexual Activity  . Alcohol use: Not Currently  . Drug use: Not Currently    Types: Marijuana  . Sexual activity: Yes    Partners: Male    Birth control/protection: None  Lifestyle  . Physical activity:    Days per week: Not on file    Minutes per session: Not on file  . Stress: Not on file  Relationships  . Social connections:    Talks on phone: Not on file    Gets together: Not on file    Attends religious service: Not on file    Active member of club or organization: Not on file    Attends meetings of  clubs or organizations: Not on file    Relationship status: Not on file  . Intimate partner violence:    Fear of current or ex partner: Not on file    Emotionally abused: Not on file    Physically abused: Not on file    Forced sexual activity: Not on file  Other Topics Concern  . Not on file  Social History Narrative  . Not on file   No current facility-administered medications on file prior to encounter.    Current Outpatient Medications on File Prior to Encounter  Medication Sig Dispense Refill  . metoCLOPramide (REGLAN) 10 MG tablet Take 1 tablet (10 mg total) by mouth every 6 (six) hours. 30 tablet 0  . Prenatal Vit-Fe Fumarate-FA (PRENATAL MULTIVITAMIN) TABS tablet Take 1 tablet by mouth daily at 12 noon.     No Known Allergies  ROS:  Review of Systems   I have reviewed patient's Past Medical Hx, Surgical Hx, Family Hx, Social Hx, medications and allergies.   Physical Exam   Patient Vitals for the past 24 hrs:  BP Temp Temp src Pulse Resp SpO2  Weight  07/30/18 0957 123/66 98.3 F (36.8 C) Oral 93 17 100 % 86.1 kg   Constitutional: Well-developed, well-nourished female in no acute distress.  Cardiovascular: normal rate Respiratory: normal effort GI: Abd soft, non-tender. Pos BS x 4 MS: Extremities nontender, no edema, normal ROM Neurologic: Alert and oriented x 4.  GU: Neg CVAT.  Pelvic exam: Pt self swabbed for wet prep and GC  FHT 160 by doppler  LAB RESULTS Results for orders placed or performed during the hospital encounter of 07/30/18 (from the past 24 hour(s))  Wet prep, genital     Status: Abnormal   Collection Time: 07/30/18 10:08 AM  Result Value Ref Range   Yeast Wet Prep HPF POC NONE SEEN NONE SEEN   Trich, Wet Prep NONE SEEN NONE SEEN   Clue Cells Wet Prep HPF POC PRESENT (A) NONE SEEN   WBC, Wet Prep HPF POC MANY (A) NONE SEEN   Sperm NONE SEEN     --/--/B POS Performed at [facility], [address] [place], [place] [address]  (05/21  1308)  IMAGING No results found.  MAU Management/MDM: Orders Placed This Encounter  Procedures  . Wet prep, genital  . Discharge patient    Meds ordered this encounter  Medications  . metroNIDAZOLE (FLAGYL) 500 MG tablet    Sig: Take 1 tablet (500 mg total) by mouth 2 (two) times daily for 7 days.    Dispense:  14 tablet    Refill:  0    Order Specific Question:   Supervising Provider    Answer:   ERVIN, MICHAEL L [[id number]]    Wet prep c/w BV.  Will treat with Flagyl. Rx sent. Pt interested in switching prenatal care to deliver at [facility]  List of providers given.  Pt discharged with strict return precautions.  ASSESSMENT 1. Bacterial vaginosis   2. [redacted] weeks gestation of pregnancy     PLAN Discharge home Allergies as of 07/30/2018   No Known Allergies     Medication List    TAKE these medications   metoCLOPramide 10 MG tablet Commonly known as:  REGLAN Take 1 tablet (10 mg total) by mouth every 6 (six) hours.   metroNIDAZOLE 500 MG tablet Commonly known as:  FLAGYL Take 1 tablet (500 mg total) by mouth 2 (two) times daily for 7 days.   prenatal multivitamin Tabs tablet Take 1 tablet by mouth daily at 12 noon.      Follow-up Information    Your prenatal providers at [facility] Follow up.   Why:  As scheduled.            Sharen Counter Certified Nurse-Midwife 07/30/2018  6:28 PM

## 2018-07-30 NOTE — Discharge Instructions (Signed)
[facility] Providers    [facility]       Phone: [phone]  [facility]   Phone: [phone]  [facility]  Phone: [phone]  [facility] [place]  Phone: [phone]  [facility]  Phone: [phone]  [facility]   Phone: [phone]  [facility]       Phone: [phone]  [facility]    Phone: [phone]   [facility]    Phone: [phone]  [facility]    Phone: [phone]  [facility]    Phone: [phone]  [facility]       Phone: [phone]   [facility]  Phone: [phone]  [facility]    Phone: [phone]  [facility]   Phone: [phone]  [facility]      Phone: [phone]  [facility]    Phone: [phone]   

## 2018-07-30 NOTE — MAU Note (Signed)
Has some d/c, thinks it is BV, has an odor and has had that before. Has been having some Braxton Hicks  (2 pains since she got up at 7).

## 2018-08-02 LAB — GC/CHLAMYDIA PROBE AMP (~~LOC~~) NOT AT ARMC
Chlamydia: NEGATIVE
Neisseria Gonorrhea: NEGATIVE

## 2018-08-28 ENCOUNTER — Inpatient Hospital Stay (HOSPITAL_COMMUNITY)
Admission: AD | Admit: 2018-08-28 | Discharge: 2018-08-28 | Disposition: A | Discharge status: Home / Self Care | Payer: BLUE CROSS/BLUE SHIELD | Attending: Obstetrics and Gynecology | Admitting: Obstetrics and Gynecology

## 2018-08-28 ENCOUNTER — Other Ambulatory Visit: Payer: Self-pay

## 2018-08-28 DIAGNOSIS — O98812 Other maternal infectious and parasitic diseases complicating pregnancy, second trimester: Secondary | ICD-10-CM | POA: Diagnosis not present

## 2018-08-28 DIAGNOSIS — Z3A27 27 weeks gestation of pregnancy: Secondary | ICD-10-CM | POA: Diagnosis not present

## 2018-08-28 DIAGNOSIS — B373 Candidiasis of vulva and vagina: Secondary | ICD-10-CM

## 2018-08-28 DIAGNOSIS — Z87891 Personal history of nicotine dependence: Secondary | ICD-10-CM | POA: Insufficient documentation

## 2018-08-28 DIAGNOSIS — N898 Other specified noninflammatory disorders of vagina: Secondary | ICD-10-CM | POA: Diagnosis present

## 2018-08-28 DIAGNOSIS — B3731 Acute candidiasis of vulva and vagina: Secondary | ICD-10-CM

## 2018-08-28 LAB — WET PREP, GENITAL
Clue Cells Wet Prep HPF POC: NONE SEEN
Sperm: NONE SEEN
Trich, Wet Prep: NONE SEEN

## 2018-08-28 MED ORDER — TERCONAZOLE 0.8 % VA CREA: 1.0000 | TOPICAL_CREAM | Freq: Every day | VAGINAL | 0 refills | Status: DC

## 2018-08-28 NOTE — MAU Note (Signed)
[place] NP informed of patient, states she can self swab in triage and then wait in lobby.

## 2018-08-28 NOTE — Discharge Instructions (Signed)
Vaginal Yeast infection, Adult    Vaginal yeast infection is a condition that causes vaginal discharge as well as soreness, swelling, and redness (inflammation) of the vagina. This is a common condition. Some women get this infection frequently.  What are the causes?  This condition is caused by a change in the normal balance of the yeast (candida) and bacteria that live in the vagina. This change causes an overgrowth of yeast, which causes the inflammation.  What increases the risk?  The condition is more likely to develop in women who:   Take antibiotic medicines.   Have diabetes.   Take birth control pills.   Are pregnant.   Douche often.   Have a weak body defense system (immune system).   Have been taking steroid medicines for a long time.   Frequently wear tight clothing.  What are the signs or symptoms?  Symptoms of this condition include:   White, thick, creamy vaginal discharge.   Swelling, itching, redness, and irritation of the vagina. The lips of the vagina (vulva) may be affected as well.   Pain or a burning feeling while urinating.   Pain during sex.  How is this diagnosed?  This condition is diagnosed based on:   Your medical history.   A physical exam.   A pelvic exam. Your health care provider will examine a sample of your vaginal discharge under a microscope. Your health care provider may send this sample for testing to confirm the diagnosis.  How is this treated?  This condition is treated with medicine. Medicines may be over-the-counter or prescription. You may be told to use one or more of the following:   Medicine that is taken by mouth (orally).   Medicine that is applied as a cream (topically).   Medicine that is inserted directly into the vagina (suppository).  Follow these instructions at home:    Lifestyle   Do not have sex until your health care provider approves. Tell your sex partner that you have a yeast infection. That person should go to his or her health care  provider and ask if they should also be treated.   Do not wear tight clothes, such as pantyhose or tight pants.   Wear breathable cotton underwear.  General instructions   Take or apply over-the-counter and prescription medicines only as told by your health care provider.   Eat more yogurt. This may help to keep your yeast infection from returning.   Do not use tampons until your health care provider approves.   Try taking a sitz bath to help with discomfort. This is a warm water bath that is taken while you are sitting down. The water should only come up to your hips and should cover your buttocks. Do this 3-4 times per day or as told by your health care provider.   Do not douche.   If you have diabetes, keep your blood sugar levels under control.   Keep all follow-up visits as told by your health care provider. This is important.  Contact a health care provider if:   You have a fever.   Your symptoms go away and then return.   Your symptoms do not get better with treatment.   Your symptoms get worse.   You have new symptoms.   You develop blisters in or around your vagina.   You have blood coming from your vagina and it is not your menstrual period.   You develop pain in your abdomen.  Summary     Vaginal yeast infection is a condition that causes discharge as well as soreness, swelling, and redness (inflammation) of the vagina.   This condition is treated with medicine. Medicines may be over-the-counter or prescription.   Take or apply over-the-counter and prescription medicines only as told by your health care provider.   Do not douche. Do not have sex or use tampons until your health care provider approves.   Contact a health care provider if your symptoms do not get better with treatment or your symptoms go away and then return.  This information is not intended to replace advice given to you by your health care provider. Make sure you discuss any questions you have with your health care  provider.  Document Released: 01/08/2005 Document Revised: 08/17/2017 Document Reviewed: 08/17/2017  Elsevier Interactive Patient Education  2019 [employer] Inc[employer]

## 2018-08-28 NOTE — MAU Provider Note (Signed)
Chief Complaint: Vaginal Discharge   First Provider Initiated Contact with Patient 08/28/18 [phone]     SUBJECTIVE HPI: Anita Sims is a 23 y.o. G2P1001 at 1751w3d who presents to Maternity Admissions reporting vaginal discharge. Symptoms began 3 days ago. Was treated for BV a few weeks ago & completed her tx last week. Current discharge is thin & yellow/green. Denies vaginal discharge/itching. Denies LOF, vaginal bleeding, or abdominal pain. Normal fetal movement.    Past Medical History:  Diagnosis Date  . Anemia    hx of, but not during pregnancy  . Anxiety    "was on medication long time ago"; pt states  . Bipolar 1 disorder (HCC)    "was on medication a long time ago" pt states  . Chlamydia   . Genital herpes   . Gonorrhea   . Substance abuse (HCC)    OB History  Gravida Para Term Preterm AB Living  2 1 1  0 0 1  SAB TAB Ectopic Multiple Live Births  0 0 0 0 1    # Outcome Date GA Lbr Len/2nd Weight Sex Delivery Anes PTL Lv  2 Current           1 Term 09/01/17 7062w1d 02:21 / 00:12 3181 g F Vag-Spont None  LIV   Past Surgical History:  Procedure Laterality Date  . MOUTH SURGERY     Social History   Socioeconomic History  . Marital status: Single    Spouse name: Not on file  . Number of children: Not on file  . Years of education: Not on file  . Highest education level: Not on file  Occupational History  . Not on file  Social Needs  . Financial resource strain: Not on file  . Food insecurity:    Worry: Not on file    Inability: Not on file  . Transportation needs:    Medical: Not on file    Non-medical: Not on file  Tobacco Use  . Smoking status: Former Smoker    Types: Cigars  . Smokeless tobacco: Never Used  . Tobacco comment: Hookah  Substance and Sexual Activity  . Alcohol use: Not Currently  . Drug use: Not Currently    Types: Marijuana  . Sexual activity: Yes    Partners: Male    Birth control/protection: None  Lifestyle  . Physical  activity:    Days per week: Not on file    Minutes per session: Not on file  . Stress: Not on file  Relationships  . Social connections:    Talks on phone: Not on file    Gets together: Not on file    Attends religious service: Not on file    Active member of club or organization: Not on file    Attends meetings of clubs or organizations: Not on file    Relationship status: Not on file  . Intimate partner violence:    Fear of current or ex partner: Not on file    Emotionally abused: Not on file    Physically abused: Not on file    Forced sexual activity: Not on file  Other Topics Concern  . Not on file  Social History Narrative  . Not on file   Family History  Problem Relation Age of Onset  . Diabetes Maternal Grandfather        type II  . Cancer Maternal Grandfather   . Heart disease Maternal Grandmother   . Heart disease Mother    No  current facility-administered medications on file prior to encounter.    Current Outpatient Medications on File Prior to Encounter  Medication Sig Dispense Refill  . metoCLOPramide (REGLAN) 10 MG tablet Take 1 tablet (10 mg total) by mouth every 6 (six) hours. 30 tablet 0  . Prenatal Vit-Fe Fumarate-FA (PRENATAL MULTIVITAMIN) TABS tablet Take 1 tablet by mouth daily at 12 noon.     No Known Allergies  I have reviewed patient's Past Medical Hx, Surgical Hx, Family Hx, Social Hx, medications and allergies.   Review of Systems  Constitutional: Negative.   Gastrointestinal: Negative.   Genitourinary: Positive for vaginal discharge. Negative for dysuria, vaginal bleeding and vaginal pain.    OBJECTIVE Patient Vitals for the past 24 hrs:  BP Temp Temp src Pulse Resp SpO2 Height Weight  08/28/18 0739 118/63 97.7 F (36.5 C) Oral 91 16 98 % - -  08/28/18 0735 - - - - - - 5\' 3"  (1.6 m) 88.7 kg   Constitutional: Well-developed, well-nourished female in no acute distress.  Cardiovascular: normal rate & rhythm, no murmur Respiratory: normal  rate and effort. Lung sounds clear throughout GI: Abd soft, non-tender, Pos BS x 4. No guarding or rebound tenderness MS: Extremities nontender, no edema, normal ROM Neurologic: Alert and oriented x 4.     LAB RESULTS Results for orders placed or performed during the hospital encounter of 08/28/18 (from the past 24 hour(s))  Wet prep, genital     Status: Abnormal   Collection Time: 08/28/18  7:47 AM  Result Value Ref Range   Yeast Wet Prep HPF POC PRESENT (A) NONE SEEN   Trich, Wet Prep NONE SEEN NONE SEEN   Clue Cells Wet Prep HPF POC NONE SEEN NONE SEEN   WBC, Wet Prep HPF POC MANY (A) NONE SEEN   Sperm NONE SEEN     IMAGING No results found.  MAU COURSE Orders Placed This Encounter  Procedures  . Wet prep, genital  . Discharge patient   Meds ordered this encounter  Medications  . terconazole (TERAZOL 3) 0.8 % vaginal cream    Sig: Place 1 applicator vaginally at bedtime.    Dispense:  20 g    Refill:  0    Order Specific Question:   Supervising Provider    Answer:   Alysia Penna, MICHAEL L [[id number]]    MDM FHT present via doppler. Pt with no ob complaints today.   Wet prep + yeast. Will tx with terazol. GC/CT pending.   ASSESSMENT 1. Vaginal yeast infection   2. [redacted] weeks gestation of pregnancy     PLAN Discharge home in stable condition. Discussed reasons to return to MAU Rx terazol  Allergies as of 08/28/2018   No Known Allergies     Medication List    TAKE these medications   metoCLOPramide 10 MG tablet Commonly known as:  REGLAN Take 1 tablet (10 mg total) by mouth every 6 (six) hours.   prenatal multivitamin Tabs tablet Take 1 tablet by mouth daily at 12 noon.   terconazole 0.8 % vaginal cream Commonly known as:  Terazol 3 Place 1 applicator vaginally at bedtime.        Judeth Horn, NP 08/28/2018  11:03 AM

## 2018-08-28 NOTE — MAU Note (Signed)
Anita Sims is a 23 y.o. at [redacted]w[redacted]d here in MAU reporting: states she thinks she has BV again, states she recently had BV and at the end of her antibiotic regimen she was not taking the medication as prescribed. States she has foul smelling discharge, no itching. Reports no pain or bleeding, no LOF. + FM.  Onset of complaint: ongoing  Pain score: 0/10  Vitals:   08/28/18 0739  BP: 118/63  Pulse: 91  Resp: 16  Temp: 97.7 F (36.5 C)  SpO2: 98%      FHT:154  Lab orders placed from triage:

## 2018-08-30 LAB — GC/CHLAMYDIA PROBE AMP (~~LOC~~) NOT AT ARMC
Chlamydia: NEGATIVE
Neisseria Gonorrhea: NEGATIVE

## 2018-09-03 LAB — OB RESULTS CONSOLE HIV ANTIBODY (ROUTINE TESTING): HIV: REACTIVE

## 2018-10-11 ENCOUNTER — Encounter (HOSPITAL_COMMUNITY): Payer: Self-pay

## 2018-10-11 ENCOUNTER — Other Ambulatory Visit: Payer: Self-pay

## 2018-10-11 ENCOUNTER — Inpatient Hospital Stay (HOSPITAL_COMMUNITY)
Admission: AD | Admit: 2018-10-11 | Discharge: 2018-10-11 | Disposition: A | Discharge status: Home / Self Care | Payer: BC Managed Care – PPO | Attending: Obstetrics & Gynecology | Admitting: Obstetrics & Gynecology

## 2018-10-11 DIAGNOSIS — O3433 Maternal care for cervical incompetence, third trimester: Secondary | ICD-10-CM | POA: Diagnosis not present

## 2018-10-11 DIAGNOSIS — Z3A33 33 weeks gestation of pregnancy: Secondary | ICD-10-CM | POA: Diagnosis not present

## 2018-10-11 DIAGNOSIS — O479 False labor, unspecified: Secondary | ICD-10-CM | POA: Diagnosis not present

## 2018-10-11 DIAGNOSIS — O4703 False labor before 37 completed weeks of gestation, third trimester: Secondary | ICD-10-CM | POA: Insufficient documentation

## 2018-10-11 DIAGNOSIS — Z87891 Personal history of nicotine dependence: Secondary | ICD-10-CM | POA: Diagnosis not present

## 2018-10-11 LAB — FETAL FIBRONECTIN: Fetal Fibronectin: NEGATIVE

## 2018-10-11 NOTE — MAU Note (Signed)
Patient presents to MAU c/o ctx that started at 1530. Patient states that have been irregular. +fm, denies vaginal bleeding and LOF.   Patient reports that her baby has an irregular heart rhythm and will be seen in [place] at [place] this coming Wednesday.   Patient was seen by a fetal cardiologist in [place] this past Friday.

## 2018-10-11 NOTE — MAU Provider Note (Signed)
History     CSN: [id number]  Arrival date and time: 10/11/18 2055   First Provider Initiated Contact with Patient 10/11/18 2202      Chief Complaint  Patient presents with  . Contractions   Anita Sims is a 23 y.o. G2P1 at [redacted]w[redacted]d who presents to MAU with complaints of contractions. She reports contractions has been occurring since 1530 today, reports irregular contractions that happen approx every 10-15 minutes (5 per hour). She reports her contractions have spaced since being in MAU. Denies vaginal bleeding or LOF. +FM. Patient reports that her baby has an irregular heart rhythm and will be seen in [place] at [place] this coming Wednesday.    OB History    Gravida  2   Para  1   Term  1   Preterm  0   AB  0   Living  1     SAB  0   TAB  0   Ectopic  0   Multiple  0   Live Births  1           Past Medical History:  Diagnosis Date  . Anemia    hx of, but not during pregnancy  . Anxiety    "was on medication long time ago"; pt states  . Bipolar 1 disorder ([place])    "was on medication a long time ago" pt states  . Chlamydia   . Genital herpes   . Gonorrhea   . Substance abuse [facility])     Past Surgical History:  Procedure Laterality Date  . MOUTH SURGERY      Family History  Problem Relation Age of Onset  . Diabetes Maternal Grandfather        type II  . Cancer Maternal Grandfather   . Heart disease Maternal Grandmother   . Heart disease Mother     Social History   Tobacco Use  . Smoking status: Former Smoker    Types: Cigars  . Smokeless tobacco: Never Used  . Tobacco comment: Hookah  Substance Use Topics  . Alcohol use: Not Currently  . Drug use: Not Currently    Types: Marijuana    Allergies: No Known Allergies  Medications Prior to Admission  Medication Sig Dispense Refill Last Dose  . metoCLOPramide (REGLAN) 10 MG tablet Take 1 tablet (10 mg total) by mouth every 6 (six) hours. 30 tablet 0   . Prenatal Vit-Fe Fumarate-FA  (PRENATAL MULTIVITAMIN) TABS tablet Take 1 tablet by mouth daily at 12 noon.     Marland Kitchen terconazole (TERAZOL 3) 0.8 % vaginal cream Place 1 applicator vaginally at bedtime. 20 g 0     Review of Systems  Constitutional: Negative.   Respiratory: Negative.   Cardiovascular: Negative.   Gastrointestinal: Positive for abdominal pain. Negative for constipation, diarrhea, nausea and vomiting.  Genitourinary: Negative.   Musculoskeletal: Negative.    Physical Exam   Blood pressure 115/64, pulse 98, temperature 98.8 F (37.1 C), temperature source Oral, resp. rate 17, height 5\' 3"  (1.6 m), weight 91.3 kg, last menstrual period 02/17/2018, SpO2 99 %, not currently breastfeeding.  Physical Exam  Nursing note and vitals reviewed. Constitutional: She is oriented to person, place, and time. She appears well-developed and well-nourished. No distress.  Cardiovascular: Normal rate and regular rhythm.  Respiratory: Effort normal and breath sounds normal. No respiratory distress. She has no wheezes.  GI: Soft. There is no abdominal tenderness. There is no rebound and no guarding.  Gravid appropriate for gestational  age  Genitourinary:    Genitourinary Comments: FFN collected prior to pelvic examination    Neurological: She is alert and oriented to person, place, and time.  Psychiatric: She has a normal mood and affect. Her behavior is normal. Thought content normal.    Dilation: 2 Effacement (%): 70 Cervical Position: Posterior Station: -2 Presentation: Vertex Exam by:: Lanice ShirtsV Santiel Topper CNM  FHR:150/moderate/ +accels  Toco: UI MAU Course  Procedures  MDM Cervical examination  PO Hydration  FFN collected and sent- FFN negative   Recheck of cervix 1hr4730min later noted no cervical change, patient reports contractions have continued to space and they are not painful   Educated and discussed reasons to return to MAU, follow up as scheduled for prenatal appointment on Wednesday, hydration rest and FKC. Pt  stable at time of discharge.   Assessment and Plan   1. Premature cervical dilation, third trimester   2. Braxton Hick's contraction   3. [redacted] weeks gestation of pregnancy    Discharge home Follow up as scheduled for prenatal appointments  Return to MAU as needed NST reactive for GA  PTL precautions discussed  FKC and hydration   Follow-up Information    [facility] Follow up.   Specialty: Obstetrics and Gynecology Why: Return to MAU as needed for preterm labor precautions  Contact information: [address] [id number] [place] [id number] [phone]         Allergies as of 10/11/2018   No Known Allergies     Medication List    STOP taking these medications   terconazole 0.8 % vaginal cream Commonly known as: Terazol 3     TAKE these medications   metoCLOPramide 10 MG tablet Commonly known as: REGLAN Take 1 tablet (10 mg total) by mouth every 6 (six) hours.   prenatal multivitamin Tabs tablet Take 1 tablet by mouth daily at 12 noon.       Sharyon CableVeronica C Flora Ratz CNM  10/11/2018, 11:51 PM

## 2018-10-12 NOTE — MAU Note (Signed)
I have communicated with Wende Bushy, CNM and reviewed vital signs:  Vitals:   10/11/18 2340 10/11/18 2349  BP: 111/65   Pulse: 95   Resp:    Temp:  98.3 F (36.8 C)  SpO2:      Vaginal exam:  Dilation: 2 Effacement (%): 70 Cervical Position: Posterior Station: -2 Presentation: Vertex Exam by:: Wende Bushy CNM,   Also reviewed contraction pattern and that non-stress test is reactive.  It has been documented that patient is not contracting every with no cervical change over 2 hours not indicating active labor.  Patient denies any other complaints.  Based on this report provider has given order for discharge.  A discharge order and diagnosis entered by a provider.   Labor discharge instructions reviewed with patient.

## 2018-10-25 LAB — OB RESULTS CONSOLE GBS: GBS: NEGATIVE

## 2018-10-26 ENCOUNTER — Inpatient Hospital Stay (HOSPITAL_COMMUNITY)
Admission: AD | Admit: 2018-10-26 | Discharge: 2018-10-26 | Disposition: A | Discharge status: Home / Self Care | Payer: BC Managed Care – PPO | Attending: Obstetrics & Gynecology | Admitting: Obstetrics & Gynecology

## 2018-10-26 ENCOUNTER — Other Ambulatory Visit: Payer: Self-pay

## 2018-10-26 DIAGNOSIS — N760 Acute vaginitis: Secondary | ICD-10-CM

## 2018-10-26 DIAGNOSIS — O98813 Other maternal infectious and parasitic diseases complicating pregnancy, third trimester: Secondary | ICD-10-CM

## 2018-10-26 DIAGNOSIS — Z87891 Personal history of nicotine dependence: Secondary | ICD-10-CM | POA: Insufficient documentation

## 2018-10-26 DIAGNOSIS — N898 Other specified noninflammatory disorders of vagina: Secondary | ICD-10-CM | POA: Diagnosis present

## 2018-10-26 DIAGNOSIS — Z3A35 35 weeks gestation of pregnancy: Secondary | ICD-10-CM | POA: Insufficient documentation

## 2018-10-26 DIAGNOSIS — O23593 Infection of other part of genital tract in pregnancy, third trimester: Secondary | ICD-10-CM | POA: Insufficient documentation

## 2018-10-26 DIAGNOSIS — B9689 Other specified bacterial agents as the cause of diseases classified elsewhere: Secondary | ICD-10-CM

## 2018-10-26 LAB — URINALYSIS, ROUTINE W REFLEX MICROSCOPIC
Bilirubin Urine: NEGATIVE
Glucose, UA: NEGATIVE mg/dL
Hgb urine dipstick: NEGATIVE
Ketones, ur: NEGATIVE mg/dL
Nitrite: NEGATIVE
Protein, ur: NEGATIVE mg/dL
Specific Gravity, Urine: 1.013 (ref 1.005–1.030)
pH: 7 (ref 5.0–8.0)

## 2018-10-26 LAB — WET PREP, GENITAL
Sperm: NONE SEEN
Trich, Wet Prep: NONE SEEN
Yeast Wet Prep HPF POC: NONE SEEN

## 2018-10-26 MED ORDER — METRONIDAZOLE 500 MG PO TABS: 500.0000 mg | ORAL_TABLET | Freq: Two times a day (BID) | ORAL | 0 refills | Status: AC

## 2018-10-26 NOTE — Discharge Instructions (Signed)
Bacterial Vaginosis  Bacterial vaginosis is a vaginal infection that occurs when the normal balance of bacteria in the vagina is disrupted. It results from an overgrowth of certain bacteria. This is the most common vaginal infection among women ages 71-44. Because bacterial vaginosis increases your risk for STIs (sexually transmitted infections), getting treated can help reduce your risk for chlamydia, gonorrhea, herpes, and HIV (human immunodeficiency virus). Treatment is also important for preventing complications in pregnant women, because this condition can cause an early (premature) delivery. What are the causes? This condition is caused by an increase in harmful bacteria that are normally present in small amounts in the vagina. However, the reason that the condition develops is not fully understood. What increases the risk? The following factors may make you more likely to develop this condition:  Having a new sexual partner or multiple sexual partners.  Having unprotected sex.  Douching.  Having an intrauterine device (IUD).  Smoking.  Drug and alcohol abuse.  Taking certain antibiotic medicines.  Being pregnant. You cannot get bacterial vaginosis from toilet seats, bedding, swimming pools, or contact with objects around you. What are the signs or symptoms? Symptoms of this condition include:  Grey or white vaginal discharge. The discharge can also be watery or foamy.  A fish-like odor with discharge, especially after sexual intercourse or during menstruation.  Itching in and around the vagina.  Burning or pain with urination. Some women with bacterial vaginosis have no signs or symptoms. How is this diagnosed? This condition is diagnosed based on:  Your medical history.  A physical exam of the vagina.  Testing a sample of vaginal fluid under a microscope to look for a large amount of bad bacteria or abnormal cells. Your health care provider may use a cotton swab or  a small wooden spatula to collect the sample. How is this treated? This condition is treated with antibiotics. These may be given as a pill, a vaginal cream, or a medicine that is put into the vagina (suppository). If the condition comes back after treatment, a second round of antibiotics may be needed. Follow these instructions at home: Medicines  Take over-the-counter and prescription medicines only as told by your health care provider.  Take or use your antibiotic as told by your health care provider. Do not stop taking or using the antibiotic even if you start to feel better. General instructions  If you have a female sexual partner, tell her that you have a vaginal infection. She should see her health care provider and be treated if she has symptoms. If you have a female sexual partner, he does not need treatment.  During treatment: ? Avoid sexual activity until you finish treatment. ? Do not douche. ? Avoid alcohol as directed by your health care provider. ? Avoid breastfeeding as directed by your health care provider.  Drink enough water and fluids to keep your urine clear or pale yellow.  Keep the area around your vagina and rectum clean. ? Wash the area daily with warm water. ? Wipe yourself from front to back after using the toilet.  Keep all follow-up visits as told by your health care provider. This is important. How is this prevented?  Do not douche.  Wash the outside of your vagina with warm water only.  Use protection when having sex. This includes latex condoms and dental dams.  Limit how many sexual partners you have. To help prevent bacterial vaginosis, it is best to have sex with just one partner (  monogamous).  Make sure you and your sexual partner are tested for STIs.  Wear cotton or cotton-lined underwear.  Avoid wearing tight pants and pantyhose, especially during summer.  Limit the amount of alcohol that you drink.  Do not use any products that contain  nicotine or tobacco, such as cigarettes and e-cigarettes. If you need help quitting, ask your health care provider.  Do not use illegal drugs. Where to find more information  Centers for Disease Control and Prevention: AppraiserFraud.fi  American Sexual Health Association (ASHA): www.ashastd.org  U.S. Department of Health and Financial controller, Office on Women's Health: DustingSprays.pl or SecuritiesCard.it Contact a health care provider if:  Your symptoms do not improve, even after treatment.  You have more discharge or pain when urinating.  You have a fever.  You have pain in your abdomen.  You have pain during sex.  You have vaginal bleeding between periods. Summary  Bacterial vaginosis is a vaginal infection that occurs when the normal balance of bacteria in the vagina is disrupted.  Because bacterial vaginosis increases your risk for STIs (sexually transmitted infections), getting treated can help reduce your risk for chlamydia, gonorrhea, herpes, and HIV (human immunodeficiency virus). Treatment is also important for preventing complications in pregnant women, because the condition can cause an early (premature) delivery.  This condition is treated with antibiotic medicines. These may be given as a pill, a vaginal cream, or a medicine that is put into the vagina (suppository). This information is not intended to replace advice given to you by your health care provider. Make sure you discuss any questions you have with your health care provider. Document Released: 03/31/2005 Document Revised: 03/13/2017 Document Reviewed: 12/15/2015 Elsevier Patient Education  2020 [place].    Fetal Movement Counts Patient Name: ________________________________________________ Patient Due Date: ____________________ What is a fetal movement count?  A fetal movement count is the number of times that you feel your baby move during a certain  amount of time. This may also be called a fetal kick count. A fetal movement count is recommended for every pregnant woman. You may be asked to start counting fetal movements as early as week 28 of your pregnancy. Pay attention to when your baby is most active. You may notice your baby's sleep and wake cycles. You may also notice things that make your baby move more. You should do a fetal movement count:  When your baby is normally most active.  At the same time each day. A good time to count movements is while you are resting, after having something to eat and drink. How do I count fetal movements? 1. Find a quiet, comfortable area. Sit, or lie down on your side. 2. Write down the date, the start time and stop time, and the number of movements that you felt between those two times. Take this information with you to your health care visits. 3. For 2 hours, count kicks, flutters, swishes, rolls, and jabs. You should feel at least 10 movements during 2 hours. 4. You may stop counting after you have felt 10 movements. 5. If you do not feel 10 movements in 2 hours, have something to eat and drink. Then, keep resting and counting for 1 hour. If you feel at least 4 movements during that hour, you may stop counting. Contact a health care provider if:  You feel fewer than 4 movements in 2 hours.  Your baby is not moving like he or she usually does. Date: ____________ Start time: ____________ Stop  Stop time: ____________ Movements: ____________ Date: ____________ Start time: ____________ Stop time: ____________ Movements: ____________ Date: ____________ Start time: ____________ Stop time: ____________ Movements: ____________ Date: ____________ Start time: ____________ Stop time: ____________ Movements: ____________ Date: ____________ Start time: ____________ Stop time: ____________ Movements: ____________ Date: ____________ Start time: ____________ Stop time: ____________ Movements: ____________ Date:  ____________ Start time: ____________ Stop time: ____________ Movements: ____________ Date: ____________ Start time: ____________ Stop time: ____________ Movements: ____________ Date: ____________ Start time: ____________ Stop time: ____________ Movements: ____________ This information is not intended to replace advice given to you by your health care provider. Make sure you discuss any questions you have with your health care provider. Document Released: 04/30/2006 Document Revised: 04/20/2018 Document Reviewed: 05/10/2015 Elsevier Patient Education  2020 Elsevier Inc.  

## 2018-10-26 NOTE — MAU Provider Note (Signed)
Chief Complaint:  Vaginal Discharge   First Provider Initiated Contact with Patient 10/26/18 1716     HPI: Anita Sims is a 23 y.o. G2P1001 at [redacted]w[redacted]d who presents to maternity admissions reporting vaginal discharge. Symptoms started 3 days ago. Reports yellow discharge with foul odor. No itching or irritation. States she has been treated for BV & yeast multiple times during this pregnancy.  Denies contractions, leakage of fluid or vaginal bleeding. Good fetal movement.   Past Medical History:  Diagnosis Date  . Anemia    hx of, but not during pregnancy  . Anxiety    "was on medication long time ago"; pt states  . Bipolar 1 disorder ([place])    "was on medication a long time ago" pt states  . Chlamydia   . Genital herpes   . Gonorrhea   . Substance abuse ([place])    OB History  Gravida Para Term Preterm AB Living  2 1 1  0 0 1  SAB TAB Ectopic Multiple Live Births  0 0 0 0 1    # Outcome Date GA Lbr Len/2nd Weight Sex Delivery Anes PTL Lv  2 Current           1 Term 09/01/17 [redacted]w[redacted]d 02:21 / 00:12 3181 g F Vag-Spont None  LIV   Past Surgical History:  Procedure Laterality Date  . MOUTH SURGERY     Family History  Problem Relation Age of Onset  . Diabetes Maternal Grandfather        type II  . Cancer Maternal Grandfather   . Heart disease Maternal Grandmother   . Heart disease Mother    Social History   Tobacco Use  . Smoking status: Former Smoker    Types: Cigars  . Smokeless tobacco: Never Used  . Tobacco comment: Hookah  Substance Use Topics  . Alcohol use: Not Currently  . Drug use: Not Currently    Types: Marijuana   No Known Allergies Medications Prior to Admission  Medication Sig Dispense Refill Last Dose  . metoCLOPramide (REGLAN) 10 MG tablet Take 1 tablet (10 mg total) by mouth every 6 (six) hours. 30 tablet 0   . Prenatal Vit-Fe Fumarate-FA (PRENATAL MULTIVITAMIN) TABS tablet Take 1 tablet by mouth daily at 12 noon.       I have reviewed patient's  Past Medical Hx, Surgical Hx, Family Hx, Social Hx, medications and allergies.   ROS:  Review of Systems  Constitutional: Negative.   Gastrointestinal: Negative.   Genitourinary: Positive for vaginal discharge. Negative for dysuria and vaginal bleeding.    Physical Exam   Patient Vitals for the past 24 hrs:  BP Temp Pulse Resp SpO2  10/26/18 1745 113/64 - 94 - -  10/26/18 1706 123/60 98.4 F (36.9 C) 92 18 99 %    Constitutional: Well-developed, well-nourished female in no acute distress.  Cardiovascular: normal rate & rhythm, no murmur Respiratory: normal effort, lung sounds clear throughout GI: Abd soft, non-tender, gravid appropriate for gestational age. Pos BS x 4 MS: Extremities nontender, no edema, normal ROM Neurologic: Alert and oriented x 4.   Labs: Results for orders placed or performed during the hospital encounter of 10/26/18 (from the past 24 hour(s))  Wet prep, genital     Status: Abnormal   Collection Time: 10/26/18  5:14 PM  Result Value Ref Range   Yeast Wet Prep HPF POC NONE SEEN NONE SEEN   Trich, Wet Prep NONE SEEN NONE SEEN   Clue Cells Wet Prep  HPF POC PRESENT (A) NONE SEEN   WBC, Wet Prep HPF POC MODERATE (A) NONE SEEN   Sperm NONE SEEN     Imaging:  No results found.  MAU Course: Orders Placed This Encounter  Procedures  . Wet prep, genital  . Urinalysis, Routine w reflex microscopic  . Discharge patient   Meds ordered this encounter  Medications  . metroNIDAZOLE (FLAGYL) 500 MG tablet    Sig: Take 1 tablet (500 mg total) by mouth 2 (two) times daily for 7 days.    Dispense:  14 tablet    Refill:  0    Order Specific Question:   Supervising Provider    Answer:   Hollins BingPICKENS, CHARLIE [[id number]]    MDM: FHT present via doppler   Pt declines pelvic exam & requests to self swab GC/CT & wet prep collected Wet prep + clue cells. Will tx for BV  Assessment: 1. BV (bacterial vaginosis)   2. [redacted] weeks gestation of pregnancy      Plan: Discharge home in stable condition.  Discussed reasons to return to MAU Rx flagyl GC/CT pending  Follow-up Information    [facility] Follow up.   Specialty: Obstetrics and Gynecology Why: return for worsening symptoms Contact information: [address] [id number] [place] [id number] [phone]          Allergies as of 10/26/2018   No Known Allergies     Medication List    STOP taking these medications   metoCLOPramide 10 MG tablet Commonly known as: REGLAN     TAKE these medications   metroNIDAZOLE 500 MG tablet Commonly known as: FLAGYL Take 1 tablet (500 mg total) by mouth 2 (two) times daily for 7 days.   prenatal multivitamin Tabs tablet Take 1 tablet by mouth daily at 12 noon.       Judeth HornLawrence, Deundre Thong, NP 10/26/2018 5:50 PM

## 2018-10-26 NOTE — MAU Note (Signed)
.   Anita Sims is a 23 y.o. at [redacted]w[redacted]d here in MAU reporting: white/yellow vaginal discharge for a couple of days. Denies any pain, VB or LOF  Onset of complaint: 5 days Pain score: 0 Vitals:   10/26/18 1706  BP: 123/60  Pulse: 92  Resp: 18  Temp: 98.4 F (36.9 C)  SpO2: 99%     FHT:160 Lab orders placed from triage: UA

## 2018-10-27 LAB — GC/CHLAMYDIA PROBE AMP (~~LOC~~) NOT AT ARMC
Chlamydia: NEGATIVE
Neisseria Gonorrhea: NEGATIVE

## 2018-11-15 ENCOUNTER — Other Ambulatory Visit: Payer: Self-pay

## 2018-11-15 ENCOUNTER — Encounter (HOSPITAL_COMMUNITY): Payer: Self-pay | Admitting: Emergency Medicine

## 2018-11-15 ENCOUNTER — Emergency Department (HOSPITAL_COMMUNITY): Payer: BC Managed Care – PPO

## 2018-11-15 ENCOUNTER — Emergency Department (HOSPITAL_COMMUNITY)
Admission: EM | Admit: 2018-11-15 | Discharge: 2018-11-15 | Disposition: A | Discharge status: Home / Self Care | Payer: BC Managed Care – PPO | Source: Home / Self Care | Attending: Emergency Medicine | Admitting: Emergency Medicine

## 2018-11-15 DIAGNOSIS — Y999 Unspecified external cause status: Secondary | ICD-10-CM | POA: Insufficient documentation

## 2018-11-15 DIAGNOSIS — Z3A39 39 weeks gestation of pregnancy: Secondary | ICD-10-CM | POA: Insufficient documentation

## 2018-11-15 DIAGNOSIS — Z87891 Personal history of nicotine dependence: Secondary | ICD-10-CM | POA: Insufficient documentation

## 2018-11-15 DIAGNOSIS — Y9389 Activity, other specified: Secondary | ICD-10-CM | POA: Insufficient documentation

## 2018-11-15 DIAGNOSIS — S62317A Displaced fracture of base of fifth metacarpal bone. left hand, initial encounter for closed fracture: Secondary | ICD-10-CM

## 2018-11-15 DIAGNOSIS — Y929 Unspecified place or not applicable: Secondary | ICD-10-CM | POA: Insufficient documentation

## 2018-11-15 DIAGNOSIS — W51XXXA Accidental striking against or bumped into by another person, initial encounter: Secondary | ICD-10-CM | POA: Insufficient documentation

## 2018-11-15 MED ORDER — AMOXICILLIN-POT CLAVULANATE 875-125 MG PO TABS: 1.0000 | ORAL_TABLET | Freq: Two times a day (BID) | ORAL | 0 refills | Status: DC

## 2018-11-15 MED ORDER — TETANUS-DIPHTH-ACELL PERTUSSIS 5-2.5-18.5 LF-MCG/0.5 IM SUSP: 0.5000 mL | Freq: Once | INTRAMUSCULAR | Status: DC

## 2018-11-15 MED ORDER — AMOXICILLIN-POT CLAVULANATE 875-125 MG PO TABS
1.0000 | ORAL_TABLET | Freq: Once | ORAL | Status: AC
  Administered 2018-11-15: 15:00:00 1 via ORAL
  Filled 2018-11-15: qty 1
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
## 2018-11-15 NOTE — Progress Notes (Signed)
Orthopedic Tech Progress Note Patient Details:  Anita Sims Oct 07, 1995 [id number]  Ortho Devices Type of Ortho Device: Ace wrap, Ulna gutter splint Ortho Device/Splint Location: left Ortho Device/Splint Interventions: Application   Post Interventions Patient Tolerated: Well Instructions Provided: Care of device   [name] 11/15/2018, 2:43 PM

## 2018-11-15 NOTE — Discharge Instructions (Addendum)
Please read attached information. If you experience any new or worsening signs or symptoms please return to the emergency room for evaluation. Please follow-up with your primary care provider or specialist as discussed, please inform your OB/GYN of additional antibiotics.  Please use medication prescribed only as directed and discontinue taking if you have any concerning signs or symptoms.
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
## 2018-11-15 NOTE — ED Notes (Signed)
Pt given dc instructions and pt verbalizes understanding.   

## 2018-11-16 ENCOUNTER — Inpatient Hospital Stay (HOSPITAL_COMMUNITY)
Admission: AD | Admit: 2018-11-16 | Discharge: 2018-11-17 | DRG: 807 | Disposition: A | Discharge status: Home / Self Care | Payer: BC Managed Care – PPO | Source: Ambulatory Visit | Attending: Obstetrics & Gynecology | Admitting: Obstetrics & Gynecology

## 2018-11-16 ENCOUNTER — Inpatient Hospital Stay (HOSPITAL_COMMUNITY): Payer: BC Managed Care – PPO | Admitting: Anesthesiology

## 2018-11-16 ENCOUNTER — Other Ambulatory Visit: Payer: Self-pay

## 2018-11-16 ENCOUNTER — Encounter (HOSPITAL_COMMUNITY): Payer: Self-pay

## 2018-11-16 DIAGNOSIS — O9832 Other infections with a predominantly sexual mode of transmission complicating childbirth: Principal | ICD-10-CM | POA: Diagnosis present

## 2018-11-16 DIAGNOSIS — O26893 Other specified pregnancy related conditions, third trimester: Secondary | ICD-10-CM | POA: Diagnosis present

## 2018-11-16 DIAGNOSIS — E669 Obesity, unspecified: Secondary | ICD-10-CM | POA: Diagnosis present

## 2018-11-16 DIAGNOSIS — Z20828 Contact with and (suspected) exposure to other viral communicable diseases: Secondary | ICD-10-CM | POA: Diagnosis present

## 2018-11-16 DIAGNOSIS — O99214 Obesity complicating childbirth: Secondary | ICD-10-CM | POA: Diagnosis present

## 2018-11-16 DIAGNOSIS — A6000 Herpesviral infection of urogenital system, unspecified: Secondary | ICD-10-CM | POA: Diagnosis present

## 2018-11-16 DIAGNOSIS — Z87891 Personal history of nicotine dependence: Secondary | ICD-10-CM | POA: Diagnosis not present

## 2018-11-16 DIAGNOSIS — Z3A38 38 weeks gestation of pregnancy: Secondary | ICD-10-CM

## 2018-11-16 DIAGNOSIS — R030 Elevated blood-pressure reading, without diagnosis of hypertension: Secondary | ICD-10-CM

## 2018-11-16 LAB — SARS CORONAVIRUS 2 BY RT PCR (HOSPITAL ORDER, PERFORMED IN ~~LOC~~ HOSPITAL LAB): SARS Coronavirus 2: NEGATIVE

## 2018-11-16 LAB — TYPE AND SCREEN
ABO/RH(D): B POS
Antibody Screen: NEGATIVE

## 2018-11-16 LAB — CBC
HCT: 35.1 % — ABNORMAL LOW (ref 36.0–46.0)
Hemoglobin: 11.3 g/dL — ABNORMAL LOW (ref 12.0–15.0)
MCH: 26.2 pg (ref 26.0–34.0)
MCHC: 32.2 g/dL (ref 30.0–36.0)
MCV: 81.3 fL (ref 80.0–100.0)
Platelets: 262 10*3/uL (ref 150–400)
RBC: 4.32 MIL/uL (ref 3.87–5.11)
RDW: 15 % (ref 11.5–15.5)
WBC: 13.1 10*3/uL — ABNORMAL HIGH (ref 4.0–10.5)
nRBC: 0 % (ref 0.0–0.2)

## 2018-11-16 LAB — RPR: RPR Ser Ql: NONREACTIVE

## 2018-11-16 LAB — ABO/RH: ABO/RH(D): B POS

## 2018-11-16 LAB — POCT FERN TEST: POCT Fern Test: POSITIVE

## 2018-11-16 MED ORDER — FENTANYL-BUPIVACAINE-NACL 0.5-0.125-0.9 MG/250ML-% EP SOLN: 12.0000 mL/h | EPIDURAL | Status: DC | PRN

## 2018-11-16 MED ORDER — LACTATED RINGERS IV SOLN
INTRAVENOUS | Status: DC
  Administered 2018-11-16: 03:00:00 via INTRAVENOUS

## 2018-11-16 MED ORDER — PHENYLEPHRINE 40 MCG/ML (10ML) SYRINGE FOR IV PUSH (FOR BLOOD PRESSURE SUPPORT): 80.0000 ug | PREFILLED_SYRINGE | INTRAVENOUS | Status: DC | PRN

## 2018-11-16 MED ORDER — LACTATED RINGERS IV SOLN: 500.0000 mL | Freq: Once | INTRAVENOUS | Status: DC

## 2018-11-16 MED ORDER — SOD CITRATE-CITRIC ACID 500-334 MG/5ML PO SOLN: 30.0000 mL | ORAL | Status: DC | PRN

## 2018-11-16 MED ORDER — ONDANSETRON HCL 4 MG/2ML IJ SOLN: 4.0000 mg | Freq: Four times a day (QID) | INTRAMUSCULAR | Status: DC | PRN

## 2018-11-16 MED ORDER — SENNOSIDES-DOCUSATE SODIUM 8.6-50 MG PO TABS
2.0000 | ORAL_TABLET | ORAL | Status: DC
  Administered 2018-11-16: 2 via ORAL
  Filled 2018-11-16: qty 2

## 2018-11-16 MED ORDER — LACTATED RINGERS IV SOLN
500.0000 mL | Freq: Once | INTRAVENOUS | Status: AC
  Administered 2018-11-16: 03:00:00 500 mL via INTRAVENOUS

## 2018-11-16 MED ORDER — EPHEDRINE 5 MG/ML INJ: 10.0000 mg | INTRAVENOUS | Status: DC | PRN

## 2018-11-16 MED ORDER — TETANUS-DIPHTH-ACELL PERTUSSIS 5-2.5-18.5 LF-MCG/0.5 IM SUSP: 0.5000 mL | Freq: Once | INTRAMUSCULAR | Status: DC

## 2018-11-16 MED ORDER — BENZOCAINE-MENTHOL 20-0.5 % EX AERO: 1.0000 "application " | INHALATION_SPRAY | CUTANEOUS | Status: DC | PRN

## 2018-11-16 MED ORDER — ONDANSETRON HCL 4 MG PO TABS: 4.0000 mg | ORAL_TABLET | ORAL | Status: DC | PRN

## 2018-11-16 MED ORDER — SODIUM CHLORIDE (PF) 0.9 % IJ SOLN
INTRAMUSCULAR | Status: DC | PRN
  Administered 2018-11-16: 12 mL/h via EPIDURAL

## 2018-11-16 MED ORDER — ONDANSETRON HCL 4 MG/2ML IJ SOLN: 4.0000 mg | INTRAMUSCULAR | Status: DC | PRN

## 2018-11-16 MED ORDER — WITCH HAZEL-GLYCERIN EX PADS: 1.0000 "application " | MEDICATED_PAD | CUTANEOUS | Status: DC | PRN

## 2018-11-16 MED ORDER — ACETAMINOPHEN 325 MG PO TABS: 650.0000 mg | ORAL_TABLET | ORAL | Status: DC | PRN

## 2018-11-16 MED ORDER — DIPHENHYDRAMINE HCL 50 MG/ML IJ SOLN: 12.5000 mg | INTRAMUSCULAR | Status: DC | PRN

## 2018-11-16 MED ORDER — FENTANYL-BUPIVACAINE-NACL 0.5-0.125-0.9 MG/250ML-% EP SOLN
EPIDURAL | Status: AC
  Filled 2018-11-16: qty 250

## 2018-11-16 MED ORDER — SIMETHICONE 80 MG PO CHEW: 80.0000 mg | CHEWABLE_TABLET | ORAL | Status: DC | PRN

## 2018-11-16 MED ORDER — LIDOCAINE HCL (PF) 1 % IJ SOLN
INTRAMUSCULAR | Status: DC | PRN
  Administered 2018-11-16: 5 mL via EPIDURAL

## 2018-11-16 MED ORDER — COCONUT OIL OIL: 1.0000 "application " | TOPICAL_OIL | Status: DC | PRN

## 2018-11-16 MED ORDER — DIBUCAINE (PERIANAL) 1 % EX OINT: 1.0000 "application " | TOPICAL_OINTMENT | CUTANEOUS | Status: DC | PRN

## 2018-11-16 MED ORDER — DIPHENHYDRAMINE HCL 25 MG PO CAPS: 25.0000 mg | ORAL_CAPSULE | Freq: Four times a day (QID) | ORAL | Status: DC | PRN

## 2018-11-16 MED ORDER — FLEET ENEMA 7-19 GM/118ML RE ENEM: 1.0000 | ENEMA | RECTAL | Status: DC | PRN

## 2018-11-16 MED ORDER — OXYTOCIN BOLUS FROM INFUSION
500.0000 mL | Freq: Once | INTRAVENOUS | Status: AC
  Administered 2018-11-16: 04:00:00 500 mL via INTRAVENOUS

## 2018-11-16 MED ORDER — LACTATED RINGERS IV SOLN
500.0000 mL | INTRAVENOUS | Status: DC | PRN
  Administered 2018-11-16: 500 mL via INTRAVENOUS

## 2018-11-16 MED ORDER — PRENATAL MULTIVITAMIN CH
1.0000 | ORAL_TABLET | Freq: Every day | ORAL | Status: DC
  Administered 2018-11-16 – 2018-11-17 (×2): 1 via ORAL
  Filled 2018-11-16 (×2): qty 1

## 2018-11-16 MED ORDER — LIDOCAINE HCL (PF) 1 % IJ SOLN: 30.0000 mL | INTRAMUSCULAR | Status: DC | PRN

## 2018-11-16 MED ORDER — IBUPROFEN 600 MG PO TABS
600.0000 mg | ORAL_TABLET | Freq: Four times a day (QID) | ORAL | Status: DC
  Administered 2018-11-16 (×4): 600 mg via ORAL
  Filled 2018-11-16 (×6): qty 1

## 2018-11-16 MED ORDER — OXYTOCIN 40 UNITS IN NORMAL SALINE INFUSION - SIMPLE MED
2.5000 [IU]/h | INTRAVENOUS | Status: DC
  Administered 2018-11-16: 2.5 [IU]/h via INTRAVENOUS
  Filled 2018-11-16: qty 1000

## 2018-11-16 MED ORDER — VALACYCLOVIR HCL 500 MG PO TABS
500.0000 mg | ORAL_TABLET | Freq: Two times a day (BID) | ORAL | Status: DC
  Administered 2018-11-16 – 2018-11-17 (×3): 500 mg via ORAL
  Filled 2018-11-16 (×3): qty 1

## 2018-11-16 NOTE — Anesthesia Preprocedure Evaluation (Signed)
Anesthesia Evaluation  Patient identified by MRN, date of birth, ID band Patient awake    Reviewed: Allergy & Precautions, NPO status , Patient's Chart, lab work & pertinent test results  Airway Mallampati: II  TM Distance: >3 FB Neck ROM: Full    Dental no notable dental hx.    Pulmonary former smoker,    Pulmonary exam normal breath sounds clear to auscultation       Cardiovascular Exercise Tolerance: Good negative cardio ROS Normal cardiovascular exam Rhythm:Regular Rate:Normal     Neuro/Psych PSYCHIATRIC DISORDERS Bipolar Disorder negative neurological ROS     GI/Hepatic   Endo/Other    Renal/GU      Musculoskeletal   Abdominal   Peds  Hematology   Anesthesia Other Findings   Reproductive/Obstetrics (+) Pregnancy                             Lab Results  Component Value Date   WBC 13.1 (H) 11/16/2018   HGB 11.3 (L) 11/16/2018   HCT 35.1 (L) 11/16/2018   MCV 81.3 11/16/2018   PLT 262 11/16/2018    Anesthesia Physical Anesthesia Plan  ASA: III  Anesthesia Plan: Epidural   Post-op Pain Management:    Induction:   PONV Risk Score and Plan:   Airway Management Planned:   Additional Equipment:   Intra-op Plan:   Post-operative Plan:   Informed Consent: I have reviewed the patients History and Physical, chart, labs and discussed the procedure including the risks, benefits and alternatives for the proposed anesthesia with the patient or authorized representative who has indicated his/her understanding and acceptance.       Plan Discussed with:   Anesthesia Plan Comments:         Anesthesia Quick Evaluation

## 2018-11-16 NOTE — MAU Note (Signed)
PT SAYS UC STRONG  SINCE 12. SROM AT 2330. [place]  WITH  WAKE IN [place]- NOT BEEN HERE. BABY  HEART RATE IS OFF- LAST TIME WAS OK  - AND DECIDED TO COME HERE.  Marland Kitchen  HAS HX OF HSV- LAST OUTBREAK-   LAST Monday- TAKING VALTREX. GBS-   NEG

## 2018-11-16 NOTE — Anesthesia Postprocedure Evaluation (Signed)
Anesthesia Post Note  Patient: SYLVAN LAHM  Procedure(s) Performed: AN AD HOC LABOR EPIDURAL     Patient location during evaluation: Mother Baby Anesthesia Type: Epidural Level of consciousness: awake and alert Pain management: pain level controlled Vital Signs Assessment: post-procedure vital signs reviewed and stable Respiratory status: spontaneous breathing, nonlabored ventilation and respiratory function stable Cardiovascular status: stable Postop Assessment: no headache, no backache, epidural receding, no apparent nausea or vomiting, patient able to bend at knees, adequate PO intake and able to ambulate Anesthetic complications: no    Last Vitals:  Vitals:   11/16/18 0620 11/16/18 0808  BP: 122/66 117/67  Pulse: 78 90  Resp: 18 17  Temp: 36.6 C 36.6 C  SpO2: 99% 99%    Last Pain:  Vitals:   11/16/18 0808  TempSrc: Oral  PainSc:    Pain Goal:                Epidural/Spinal Function Cutaneous sensation: Normal sensation (11/16/18 0804)  Jabier Mutton

## 2018-11-16 NOTE — Anesthesia Procedure Notes (Signed)
Epidural Patient location during procedure: OB Start time: 11/16/2018 3:18 AM End time: 11/16/2018 3:33 AM  Staffing Anesthesiologist: Barnet Glasgow, MD Performed: anesthesiologist   Preanesthetic Checklist Completed: patient identified, site marked, surgical consent, pre-op evaluation, timeout performed, IV checked, risks and benefits discussed and monitors and equipment checked  Epidural Patient position: sitting Prep: site prepped and draped and DuraPrep Patient monitoring: continuous pulse ox and blood pressure Approach: midline Location: L2-L3 Injection technique: LOR air  Needle:  Needle type: Tuohy  Needle gauge: 17 G Needle length: 9 cm and 9 Needle insertion depth: 8 cm Catheter type: closed end flexible Catheter size: 19 Gauge Catheter at skin depth: 13 cm Test dose: negative  Assessment Events: blood not aspirated, injection not painful, no injection resistance, negative IV test and no paresthesia  Additional Notes Patient identified. Risks/Benefits/Options discussed with patient including but not limited to bleeding, infection, nerve damage, paralysis, failed block, incomplete pain control, headache, blood pressure changes, nausea, vomiting, reactions to medication both or allergic, itching and postpartum back pain. Confirmed with bedside nurse the patient's most recent platelet count. Confirmed with patient that they are not currently taking any anticoagulation, have any bleeding history or any family history of bleeding disorders. Patient expressed understanding and wished to proceed. All questions were answered. Sterile technique was used throughout the entire procedure. Please see nursing notes for vital signs. Test dose was given through epidural needle and negative prior to continuing to dose epidural or start infusion. Warning signs of high block given to the patient including shortness of breath, tingling/numbness in hands, complete motor block, or any concerning  symptoms with instructions to call for help. Patient was given instructions on fall risk and not to get out of bed. All questions and concerns addressed with instructions to call with any issues. 1 Attempt (S) . Patient tolerated procedure well.

## 2018-11-16 NOTE — Lactation Note (Addendum)
This note was copied from a baby's chart. Lactation Consultation Note  Patient Name: Boy Rhyder Bratz [id number] Date: 11/16/2018   P2, 45 7 hours old.  Mother breastfed first child for 2 weeks. Mother has splint on L arm.  Mother able to position baby and latched well in cradle hold. She states she knows how to hand express. Feed on demand approximately 8-12 times per day.   Reviewed basics. Mom made aware of O/P services, breastfeeding support groups, community resources, and our phone # for post-discharge questions.       Maternal Data    Feeding    LATCH Score                   Interventions    Lactation Tools Discussed/Used     Consult Status      Carlye Grippe 11/16/2018, 10:48 AM
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
## 2018-11-16 NOTE — H&P (Addendum)
OBSTETRIC ADMISSION HISTORY AND PHYSICAL  Anita Sims is a 23 y.o. female G2P1001 with IUP at 6637w6d by LMP c/w 15w u/s presenting for labor.   Reports fetal movement. Denies vaginal bleeding. Endorses LOF 2330 on 8/3. Endorses contractions every 2-3 minutes.  [facility]  Support person in labor: n/a  Ultrasounds . Anatomy U/S: 10/13/2018 CONCLUSION: :   Indication: 23 yr old G2P1001 at 3872w0d with fetal  arrhythmia for fetal anatomic survey.   Findings:  1. Single intrauterine pregnancy with normal cardiac  activity.  2. Estimated fetal weight is in the 70th%.  3. Posterior placenta without evidence of previa.  4. Normal amniotic fluid index.  5. The anatomy survey is limited as above; no  abnormalities seen on limited exam.  6. Ductal arch appears slightly tortuous.  7. There is an arrhythmia. ---------------------------------------------------------------------- RECOMMENDATIONS:   1. Appropriate fetal growth.  2. Normal limited anatomy survey:  - discussed limitations of ultrasound in detecting fetal  anomalies  3. Fetal arrhythmia:  - previously counseled by Pediatric Cardiology  - fetal echocardiogram showed- structurally normal  heart; Frequent single premature atrial contractions. No  evidence of SVT on this study. Occasional non-  conducted PAC's. Occasional atrial bigeminy, trigeminy  and atrial couplets are observed  - recommend neonatal EKG and echocardiogram  - recommend weekly doptones to screen for fetal  tachyarrhythmia  4. Short interval pregnancy:  - recommend serial fetal growth  Prenatal History/Complications: Marland Kitchen. Maternal obesity . Hx of postpartum depression (not on meds) . Short interval pregnancy . Fetal arrhythmia  . HSV (last outbreak last week, on suppression)  Past Medical History: Past Medical History:  Diagnosis Date  . Anemia    hx of, but not during pregnancy  . Anxiety    "was on  medication long time ago"; pt states  . Bipolar 1 disorder (HCC)    "was on medication a long time ago" pt states  . Chlamydia   . Genital herpes   . Gonorrhea   . Substance abuse [facility](HCC)     Past Surgical History: Past Surgical History:  Procedure Laterality Date  . MOUTH SURGERY      Obstetrical History: OB History    Gravida  2   Para  1   Term  1   Preterm  0   AB  0   Living  1     SAB  0   TAB  0   Ectopic  0   Multiple  0   Live Births  1           Social History: Social History   Socioeconomic History  . Marital status: Single    Spouse name: Not on file  . Number of children: Not on file  . Years of education: Not on file  . Highest education level: Not on file  Occupational History  . Not on file  Social Needs  . Financial resource strain: Not on file  . Food insecurity    Worry: Not on file    Inability: Not on file  . Transportation needs    Medical: Not on file    Non-medical: Not on file  Tobacco Use  . Smoking status: Former Smoker    Types: Cigars  . Smokeless tobacco: Never Used  . Tobacco comment: Hookah  Substance and Sexual Activity  . Alcohol use: Not Currently  . Drug use: Not Currently    Types: Marijuana  .  Sexual activity: Yes    Partners: Male    Birth control/protection: None  Lifestyle  . Physical activity    Days per week: Not on file    Minutes per session: Not on file  . Stress: Not on file  Relationships  . Social Herbalist on phone: Not on file    Gets together: Not on file    Attends religious service: Not on file    Active member of club or organization: Not on file    Attends meetings of clubs or organizations: Not on file    Relationship status: Not on file  Other Topics Concern  . Not on file  Social History Narrative  . Not on file    Family History: Family History  Problem Relation Age of Onset  . Diabetes Maternal Grandfather        type II  . Cancer Maternal  Grandfather   . Heart disease Maternal Grandmother   . Heart disease Mother     Allergies: No Known Allergies  Medications Prior to Admission  Medication Sig Dispense Refill Last Dose  . amoxicillin-clavulanate (AUGMENTIN) 875-125 MG tablet Take 1 tablet by mouth every 12 (twelve) hours. 14 tablet 0 11/15/2018 at Unknown time  . Prenatal Vit-Fe Fumarate-FA (PRENATAL MULTIVITAMIN) TABS tablet Take 1 tablet by mouth daily at 12 noon.   11/15/2018 at Unknown time  . valACYclovir (VALTREX) 500 MG tablet Take 500 mg by mouth 2 (two) times daily. Take 2 tabs twice a day   11/15/2018 at Unknown time     Review of Systems  All systems reviewed and negative except as stated in HPI  Blood pressure 119/75, pulse (!) 107, temperature 98.7 F (37.1 C), temperature source Oral, resp. rate 20, height 5\' 3"  (1.6 m), weight 94.4 kg, last menstrual period 02/17/2018, not currently breastfeeding. General appearance: alert, cooperative and moderate distress Lungs: no respiratory distress Heart: regular rate  Abdomen: soft, non-tender; gravid b Pelvic: deferred Extremities: Homans sign is negative, no sign of DVT Presentation: cephalic Fetal monitoring: baseline 140 bpm, mod variability, +accels, no decels Uterine activity: q2-3 minutes  SSE: no active lesions noted on external genitalia, vaginal wall or cervix  Prenatal labs: ABO, Rh:  B pos Antibody:  neg Rubella:  imm RPR:   NR HBsAg:   NR HIV:   Neg GBS:   neg Glucola: passed Genetic screening:  declined  Prenatal Transfer Tool  Maternal Diabetes: No Genetic Screening: Declined Maternal Ultrasounds/Referrals: Normal Fetal Ultrasounds or other Referrals:  Fetal echo, Referred to Materal Fetal Medicine fetal arrhythmia  Maternal Substance Abuse:  No Significant Maternal Medications:  Meds include: Other: valtrex Significant Maternal Lab Results: None  No results found for this or any previous visit (from the past 24 hour(s)).  Patient  Active Problem List   Diagnosis Date Noted  . Indication for care in labor or delivery 09/01/2017  . Polysubstance abuse ([place]) 09/01/2017  . SVD (spontaneous vaginal delivery) 09/01/2017  . Elevated BP without diagnosis of hypertension 09/01/2017  . Contraception management 12/31/2012  . Implanon removal 12/31/2012    Assessment/Plan:  Anita Sims is a 23 y.o. G2P1001 at [redacted]w[redacted]d here for active labor.  Labor: progressing well, SSE unremarkable with no active lesions, SROM @2330 . Will continue time appropriate cervical checks and augment as necessary. -- pain control: patient desires epidural  Fetal Wellbeing: EFW 3548g (87%ile) at 38w (3712g @39w ). Cephalic by cervical exam.  -- GBS (negative) -- continuous fetal monitoring -  cat 1   Fetal arrhythmia  --fetal PACs noted --weekly heart tones --no congenital heart defects noted --recommend fetal ekg/echo 1-2d of life  Hx of postpartum depression --been on meds in the remote past --continue to monitor  HSV --taking valtrex 1000 mg BID --notes she had a lesion last week --no active lesions on SSE today --plan for SVD after discussion with Dr. Debroah LoopArnold, attending on call  Postpartum Planning -- breast -- Rubella imm/[10/22/2018]Tdap  --unsure about contraception  Mart PiggsAlicia Firestone, PGY-III Family Medicine  I confirm that I have verified the information documented in the resident's note and that I have also personally reperformed the history, physical exam and all medical decision making activities of this service and have verified that all service and findings are accurately documented in this resident's note.   Clayton BiblesSamantha Maevis Mumby, CNM 11/16/2018 3:59 AM

## 2018-11-17 MED ORDER — IBUPROFEN 600 MG PO TABS: 600.0000 mg | ORAL_TABLET | Freq: Four times a day (QID) | ORAL | 0 refills | Status: DC

## 2018-11-17 NOTE — Lactation Note (Signed)
This note was copied from a baby's chart. Lactation Consultation Note  Patient Name: Anita Sims [id number] Date: 11/17/2018   Mom denies having any questions or concerns about breastfeeding, saying, "I'm good."   Matthias Hughs [facility] 11/17/2018, 10:19 AM

## 2018-11-17 NOTE — Discharge Summary (Signed)
Obstetrics Discharge Summary OB/GYN Faculty Practice   Patient Name: Anita Sims DOB: June 26, 1995 MRN: [id number]  Date of admission: 11/16/2018 Delivering MD: Arrie Senate   Date of discharge: 11/17/2018  Admitting diagnosis: 21 WKS, CTX Intrauterine pregnancy: [redacted]w[redacted]d     Secondary diagnosis:   Active Problems:   Normal labor   Additional problems:  . HSV (on valtrex) . Hx of postpartum depression . Fetal arrhythmia on fetal ultrasound (following with peds)     Discharge diagnosis: Term Pregnancy Delivered                                            Postpartum procedures: None  Complications: none  Outpatient Follow-Up: [ ]  4 weeks  Hospital course: Anita Sims is a 23 y.o. [redacted]w[redacted]d who was admitted for active labor. Her pregnancy was complicated by HSV with outbreaks during pregnancy (neg SSE on admission, on valtrex), hx of postpartum depression, fetal arrhythmia on ultrasound. Her labor course was uncomplicated. Delivery was complicated by nothing. Please see delivery/op note for additional details. Her postpartum course was uncomplicated. She was breastfeeding without difficulty. By day of discharge, she was passing flatus, urinating, eating and drinking without difficulty. Her pain was well-controlled, and she was discharged home with ibuprofen. She will follow-up in clinic in 4 weeks.   Physical exam  Vitals:   11/16/18 1246 11/16/18 1727 11/16/18 2208 11/17/18 0530  BP: 106/86 117/76 113/69 107/69  Pulse: 66 72 64 71  Resp: 17 18  18   Temp: 97.9 F (36.6 C) 98 F (36.7 C) 99.1 F (37.3 C) 98.7 F (37.1 C)  TempSrc: Oral  Oral Oral  SpO2: 100% 100% 99% 100%  Weight:      Height:       General: well appearing, NAD Lochia: appropriate Uterine Fundus: firm Incision: N/A DVT Evaluation: No evidence of DVT seen on physical exam. Labs: Lab Results  Component Value Date   WBC 13.1 (H) 11/16/2018   HGB 11.3 (L) 11/16/2018   HCT 35.1 (L) 11/16/2018    MCV 81.3 11/16/2018   PLT 262 11/16/2018   CMP Latest Ref Rng & Units 09/01/2017  Glucose 65 - 99 mg/dL 113(H)  BUN 6 - 20 mg/dL 8  Creatinine 0.44 - 1.00 mg/dL 0.63  Sodium 135 - 145 mmol/L 135  Potassium 3.5 - 5.1 mmol/L 3.3(L)  Chloride 101 - 111 mmol/L 103  CO2 22 - 32 mmol/L 15(L)  Calcium 8.9 - 10.3 mg/dL 9.5  Total Protein 6.5 - 8.1 g/dL 7.6  Total Bilirubin 0.3 - 1.2 mg/dL 0.7  Alkaline Phos 38 - 126 U/L 169(H)  AST 15 - 41 U/L 29  ALT 14 - 54 U/L 13(L)    Discharge instructions: Per After Visit Summary and "Baby and Me Booklet"  After visit meds:  Allergies as of 11/17/2018   No Known Allergies     Medication List    TAKE these medications   amoxicillin-clavulanate 875-125 MG tablet Commonly known as: AUGMENTIN Take 1 tablet by mouth every 12 (twelve) hours.   ibuprofen 600 MG tablet Commonly known as: ADVIL Take 1 tablet (600 mg total) by mouth every 6 (six) hours.   prenatal multivitamin Tabs tablet Take 1 tablet by mouth daily at 12 noon.   valACYclovir 500 MG tablet Commonly known as: VALTREX Take 500 mg by mouth 2 (two) times daily. Take 2  tabs twice a day       Postpartum contraception: Condoms Diet: Routine Diet Activity: Advance as tolerated. Pelvic rest for 6 weeks.   Follow-up Appt:No future appointments. Follow-up Visit:No follow-ups on file.  Newborn Data: Live born female  Birth Weight: 8 lb 4.8 oz (3765 g) APGAR: 8, 9  Newborn Delivery   Birth date/time: 11/16/2018 03:46:00 Delivery type: Vaginal, Spontaneous      Baby Feeding: Breast Disposition:home with mother   Mart Piggslicia Firestone, PGY-III Family Medicine   Attestation:  I confirm that I have verified the information documented in the resident's note and that I have also personally reperformed the physical exam and all medical decision making activities.   Patient was seen and examined by me also Agree with note Vitals stable Labs stable Fundus firm, lochia within  normal limits Perineum healing Ext WNL  Ready for discharge  Aviva SignsWilliams, Hasel Janish L, CNM

## 2018-11-17 NOTE — Progress Notes (Signed)
D/C instructions provided, pt states understanding, aware of follow up appt.  D/C home to car walking with staff.

## 2018-12-13 DIAGNOSIS — Z8659 Personal history of other mental and behavioral disorders: Secondary | ICD-10-CM | POA: Insufficient documentation

## 2019-02-24 ENCOUNTER — Inpatient Hospital Stay (HOSPITAL_COMMUNITY)
Admission: AD | Admit: 2019-02-24 | Discharge: 2019-02-24 | Disposition: A | Discharge status: Home / Self Care | Payer: BC Managed Care – PPO | Attending: Obstetrics and Gynecology | Admitting: Obstetrics and Gynecology

## 2019-02-24 ENCOUNTER — Other Ambulatory Visit: Payer: Self-pay

## 2019-02-24 DIAGNOSIS — Z87891 Personal history of nicotine dependence: Secondary | ICD-10-CM | POA: Insufficient documentation

## 2019-02-24 DIAGNOSIS — Z3A01 Less than 8 weeks gestation of pregnancy: Secondary | ICD-10-CM | POA: Insufficient documentation

## 2019-02-24 DIAGNOSIS — O209 Hemorrhage in early pregnancy, unspecified: Secondary | ICD-10-CM | POA: Insufficient documentation

## 2019-02-24 LAB — HCG, QUANTITATIVE, PREGNANCY: hCG, Beta Chain, Quant, S: 12 m[IU]/mL — ABNORMAL HIGH (ref <5)

## 2019-02-24 LAB — POCT PREGNANCY, URINE: Preg Test, Ur: NEGATIVE

## 2019-02-24 NOTE — Discharge Instructions (Signed)
Vaginal Bleeding During Pregnancy, First Trimester ° °A small amount of bleeding from the vagina (spotting) is relatively common during early pregnancy. It usually stops on its own. Various things may cause bleeding or spotting during early pregnancy. Some bleeding may be related to the pregnancy, and some may not. In many cases, the bleeding is normal and is not a problem. However, bleeding can also be a sign of something serious. Be sure to tell your health care provider about any vaginal bleeding right away. °Some possible causes of vaginal bleeding during the first trimester include: °· Infection or inflammation of the cervix. °· Growths (polyps) on the cervix. °· Miscarriage or threatened miscarriage. °· Pregnancy tissue developing outside of the uterus (ectopic pregnancy). °· A mass of tissue developing in the uterus due to an egg being fertilized incorrectly (molar pregnancy). °Follow these instructions at home: °Activity °· Follow instructions from your health care provider about limiting your activity. Ask what activities are safe for you. °· If needed, make plans for someone to help with your regular activities. °· Do not have sex or orgasms until your health care provider says that this is safe. °General instructions °· Take over-the-counter and prescription medicines only as told by your health care provider. °· Pay attention to any changes in your symptoms. °· Do not use tampons or douche. °· Write down how many pads you use each day, how often you change pads, and how soaked (saturated) they are. °· If you pass any tissue from your vagina, save the tissue so you can show it to your health care provider. °· Keep all follow-up visits as told by your health care provider. This is important. °Contact a health care provider if: °· You have vaginal bleeding during any part of your pregnancy. °· You have cramps or labor pains. °· You have a fever. °Get help right away if: °· You have severe cramps in your  back or abdomen. °· You pass large clots or a large amount of tissue from your vagina. °· Your bleeding increases. °· You feel light-headed or weak, or you faint. °· You have chills. °· You are leaking fluid or have a gush of fluid from your vagina. °Summary °· A small amount of bleeding (spotting) from the vagina is relatively common during early pregnancy. °· Various things may cause bleeding or spotting in early pregnancy. °· Be sure to tell your health care provider about any vaginal bleeding right away. °This information is not intended to replace advice given to you by your health care provider. Make sure you discuss any questions you have with your health care provider. °Document Released: 01/08/2005 Document Revised: 07/20/2018 Document Reviewed: 07/03/2016 °Elsevier Patient Education © 2020 [employer] ° °

## 2019-02-24 NOTE — MAU Note (Addendum)
.   Anita Sims is a 23 y.o.   here in MAU reporting: she had a positive test on Monday and started having lower abdominal cramping and vaginal bleeding today.  Intercourse last night. Pt states she had a positive covid test on November there 4th. Only symptom is the loss of smell and taste LMP: 01/20/19 Onset of complaint: today  Pain score: 7 Vitals:   02/24/19 1743  BP: 109/66  Pulse: 75  Resp: 16  Temp: 98.2 F (36.8 C)  SpO2: 100%      Lab orders placed from triage: UPT

## 2019-02-24 NOTE — MAU Provider Note (Signed)
Chief Complaint: Vaginal Bleeding and Possible Pregnancy   First Provider Initiated Contact with Patient 02/24/19 1926     SUBJECTIVE HPI: Anita Sims is a 23 y.o. G2P2002 at Unknown who presents to Maternity Admissions reporting vaginal bleeding.  Reports positive pregnancy test at home on Monday. Has had some spotting on toilet paper today. Denies abdominal pain.  Was diagnosed with COVID on 11/4. Denies any symptoms at this time.   Past Medical History:  Diagnosis Date  . Anemia    hx of, but not during pregnancy  . Anxiety    "was on medication long time ago"; pt states  . Bipolar 1 disorder (HCC)    "was on medication a long time ago" pt states  . Chlamydia   . Genital herpes   . Gonorrhea   . Substance abuse (HCC)    OB History  Gravida Para Term Preterm AB Living  2 2 2  0 0 2  SAB TAB Ectopic Multiple Live Births  0 0 0 0 2    # Outcome Date GA Lbr Len/2nd Weight Sex Delivery Anes PTL Lv  2 Term 11/16/18 [redacted]w[redacted]d 04:44 / 00:02 3765 g M Vag-Spont None  LIV  1 Term 09/01/17 [redacted]w[redacted]d 02:21 / 00:12 3181 g F Vag-Spont None  LIV   Past Surgical History:  Procedure Laterality Date  . MOUTH SURGERY     Social History   Socioeconomic History  . Marital status: Single    Spouse name: Not on file  . Number of children: Not on file  . Years of education: Not on file  . Highest education level: Not on file  Occupational History  . Not on file  Social Needs  . Financial resource strain: Not on file  . Food insecurity    Worry: Not on file    Inability: Not on file  . Transportation needs    Medical: Not on file    Non-medical: Not on file  Tobacco Use  . Smoking status: Former Smoker    Types: Cigars  . Smokeless tobacco: Never Used  . Tobacco comment: Hookah  Substance and Sexual Activity  . Alcohol use: Not Currently  . Drug use: Not Currently    Types: Marijuana  . Sexual activity: Yes    Partners: Male    Birth control/protection: None  Lifestyle  .  Physical activity    Days per week: Not on file    Minutes per session: Not on file  . Stress: Not on file  Relationships  . Social [redacted]w[redacted]d on phone: Not on file    Gets together: Not on file    Attends religious service: Not on file    Active member of club or organization: Not on file    Attends meetings of clubs or organizations: Not on file    Relationship status: Not on file  . Intimate partner violence    Fear of current or ex partner: Not on file    Emotionally abused: Not on file    Physically abused: Not on file    Forced sexual activity: Not on file  Other Topics Concern  . Not on file  Social History Narrative  . Not on file   Family History  Problem Relation Age of Onset  . Diabetes Maternal Grandfather        type II  . Cancer Maternal Grandfather   . Heart disease Maternal Grandmother   . Heart disease Mother    No  current facility-administered medications on file prior to encounter.    Current Outpatient Medications on File Prior to Encounter  Medication Sig Dispense Refill  . amoxicillin-clavulanate (AUGMENTIN) 875-125 MG tablet Take 1 tablet by mouth every 12 (twelve) hours. 14 tablet 0  . ibuprofen (ADVIL) 600 MG tablet Take 1 tablet (600 mg total) by mouth every 6 (six) hours. 30 tablet 0  . Prenatal Vit-Fe Fumarate-FA (PRENATAL MULTIVITAMIN) TABS tablet Take 1 tablet by mouth daily at 12 noon.    . valACYclovir (VALTREX) 500 MG tablet Take 500 mg by mouth 2 (two) times daily. Take 2 tabs twice a day     No Known Allergies  I have reviewed patient's Past Medical Hx, Surgical Hx, Family Hx, Social Hx, medications and allergies.   Review of Systems  Constitutional: Negative.   Gastrointestinal: Negative.   Genitourinary: Positive for vaginal bleeding.    OBJECTIVE Patient Vitals for the past 24 hrs:  BP Temp Pulse Resp SpO2  02/24/19 1743 109/66 98.2 F (36.8 C) 75 16 100 %   Constitutional: Well-developed, well-nourished female in  no acute distress.  Respiratory: normal rate and effort. MS: Extremities nontender, no edema, normal ROM Neurologic: Alert and oriented x 4.     LAB RESULTS Results for orders placed or performed during the hospital encounter of 02/24/19 (from the past 24 hour(s))  Pregnancy, urine POC     Status: None   Collection Time: 02/24/19  5:38 PM  Result Value Ref Range   Preg Test, Ur NEGATIVE NEGATIVE  hCG, quantitative, pregnancy     Status: Abnormal   Collection Time: 02/24/19  6:27 PM  Result Value Ref Range   hCG, Beta Chain, Quant, S 12 (H) <5 mIU/mL    IMAGING No results found.  MAU COURSE Orders Placed This Encounter  Procedures  . hCG, quantitative, pregnancy  . Pregnancy, urine POC  . Discharge patient   No orders of the defined types were placed in this encounter.   MDM UPT negative  RH positive  HCG = 12. Likely SAB in progress, will bring back this weekend for repeat hcg ASSESSMENT 1. Vaginal bleeding in pregnancy, first trimester     PLAN Discharge home in stable condition. SAB vs ectopic precautions Follow-up Information    [facility] Follow up.   Specialty: Obstetrics and Gynecology Why: return saturday or sunday for blood work Sport and exercise psychologist information: [address] [id number] [place] [phone]         Allergies as of 02/24/2019   No Known Allergies     Medication List    STOP taking these medications   amoxicillin-clavulanate 875-125 MG tablet Commonly known as: AUGMENTIN   ibuprofen 600 MG tablet Commonly known as: ADVIL     TAKE these medications   prenatal multivitamin Tabs tablet Take 1 tablet by mouth daily at 12 noon.   valACYclovir 500 MG tablet Commonly known as: VALTREX Take 500 mg by mouth 2 (two) times daily. Take 2 tabs twice a day        Jorje Guild, NP 02/24/2019  7:48 PM

## 2019-02-28 ENCOUNTER — Inpatient Hospital Stay (HOSPITAL_COMMUNITY)
Admission: AD | Admit: 2019-02-28 | Discharge: 2019-02-28 | Disposition: A | Discharge status: Home / Self Care | Payer: BC Managed Care – PPO | Attending: Obstetrics and Gynecology | Admitting: Obstetrics and Gynecology

## 2019-02-28 ENCOUNTER — Other Ambulatory Visit: Payer: Self-pay

## 2019-02-28 DIAGNOSIS — O039 Complete or unspecified spontaneous abortion without complication: Secondary | ICD-10-CM | POA: Diagnosis present

## 2019-02-28 LAB — HCG, QUANTITATIVE, PREGNANCY: hCG, Beta Chain, Quant, S: 2 m[IU]/mL (ref <5)

## 2019-02-28 NOTE — Discharge Instructions (Signed)
Miscarriage °A miscarriage is the loss of an unborn baby (fetus) before the 20th week of pregnancy. °Follow these instructions at home: °Medicines ° °· Take over-the-counter and prescription medicines only as told by your doctor. °· If you were prescribed antibiotic medicine, take it as told by your doctor. Do not stop taking the antibiotic even if you start to feel better. °· Do not take NSAIDs unless your doctor says that this is safe for you. NSAIDs include aspirin and ibuprofen. These medicines can cause bleeding. °Activity °· Rest as directed. Ask your doctor what activities are safe for you. °· Have someone help you at home during this time. °General instructions °· Write down how many pads you use each day and how soaked they are. °· Watch the amount of tissue or clumps of blood (blood clots) that you pass from your vagina. Save any large amounts of tissue for your doctor. °· Do not use tampons, douche, or have sex until your doctor approves. °· To help you and your partner with the process of grieving, talk with your doctor or seek counseling. °· When you are ready, meet with your doctor to talk about steps you should take for your health. Also, talk with your doctor about steps to take to have a healthy pregnancy in the future. °· Keep all follow-up visits as told by your doctor. This is important. °Contact a doctor if: °· You have a fever or chills. °· You have vaginal discharge that smells bad. °· You have more bleeding. °Get help right away if: °· You have very bad cramps or pain in your back or belly. °· You pass clumps of blood that are walnut-sized or larger from your vagina. °· You pass tissue that is walnut-sized or larger from your vagina. °· You soak more than 1 regular pad in an hour. °· You get light-headed or weak. °· You faint (pass out). °· You have feelings of sadness that do not go away, or you have thoughts of hurting yourself. °Summary °· A miscarriage is the loss of an unborn baby before  the 20th week of pregnancy. °· Follow your doctor's instructions for home care. Keep all follow-up appointments. °· To help you and your partner with the process of grieving, talk with your doctor or seek counseling. °This information is not intended to replace advice given to you by your health care provider. Make sure you discuss any questions you have with your health care provider. °Document Released: 06/23/2011 Document Revised: 07/23/2018 Document Reviewed: 05/06/2016 °Elsevier Patient Education © 2020 Elsevier Inc. ° °

## 2019-02-28 NOTE — MAU Provider Note (Signed)
Ms. Anita Sims  is a 23 y.o. [id number]  at Unknown who presents to MAU today for follow-up quant hCG. The patient denies abdominal pain, vaginal bleeding, N/V or fever.   There were no vitals taken for this visit.  GENERAL: Well-developed, well-nourished female in no acute distress.  HEENT: Normocephalic, atraumatic.   LUNGS: Effort normal HEART: Regular rate  SKIN: Warm, dry and without erythema PSYCH: Normal mood and affect   A: 1. Miscarriage      P: Discharge home Msg to [facility] to schedule SAB f/u in 2 weeks - pt would like to discuss contraception   Jorje Guild, NP  02/28/2019 8:00 PM

## 2019-03-03 ENCOUNTER — Ambulatory Visit: Payer: BC Managed Care – PPO | Admitting: Women's Health

## 2019-03-04 ENCOUNTER — Encounter (HOSPITAL_COMMUNITY): Payer: Self-pay

## 2019-03-04 ENCOUNTER — Other Ambulatory Visit: Payer: Self-pay

## 2019-03-04 ENCOUNTER — Inpatient Hospital Stay (HOSPITAL_COMMUNITY)
Admission: AD | Admit: 2019-03-04 | Discharge: 2019-03-04 | Disposition: A | Discharge status: Home / Self Care | Payer: BC Managed Care – PPO | Attending: Obstetrics & Gynecology | Admitting: Obstetrics & Gynecology

## 2019-03-04 DIAGNOSIS — B9689 Other specified bacterial agents as the cause of diseases classified elsewhere: Secondary | ICD-10-CM

## 2019-03-04 DIAGNOSIS — N898 Other specified noninflammatory disorders of vagina: Secondary | ICD-10-CM

## 2019-03-04 DIAGNOSIS — Z79899 Other long term (current) drug therapy: Secondary | ICD-10-CM | POA: Insufficient documentation

## 2019-03-04 DIAGNOSIS — N760 Acute vaginitis: Secondary | ICD-10-CM

## 2019-03-04 DIAGNOSIS — Z87891 Personal history of nicotine dependence: Secondary | ICD-10-CM | POA: Insufficient documentation

## 2019-03-04 DIAGNOSIS — Z809 Family history of malignant neoplasm, unspecified: Secondary | ICD-10-CM | POA: Diagnosis not present

## 2019-03-04 DIAGNOSIS — Z8759 Personal history of other complications of pregnancy, childbirth and the puerperium: Secondary | ICD-10-CM

## 2019-03-04 DIAGNOSIS — Z833 Family history of diabetes mellitus: Secondary | ICD-10-CM | POA: Insufficient documentation

## 2019-03-04 DIAGNOSIS — F319 Bipolar disorder, unspecified: Secondary | ICD-10-CM | POA: Insufficient documentation

## 2019-03-04 LAB — URINALYSIS, ROUTINE W REFLEX MICROSCOPIC
Bilirubin Urine: NEGATIVE
Glucose, UA: NEGATIVE mg/dL
Hgb urine dipstick: NEGATIVE
Ketones, ur: NEGATIVE mg/dL
Nitrite: NEGATIVE
Protein, ur: NEGATIVE mg/dL
Specific Gravity, Urine: 1.02 (ref 1.005–1.030)
WBC, UA: >50 WBC/hpf — ABNORMAL HIGH (ref 0–5)
pH: 6 (ref 5.0–8.0)

## 2019-03-04 LAB — WET PREP, GENITAL
Sperm: NONE SEEN
Trich, Wet Prep: NONE SEEN
Yeast Wet Prep HPF POC: NONE SEEN

## 2019-03-04 MED ORDER — FLUCONAZOLE 150 MG PO TABS: 150.0000 mg | ORAL_TABLET | Freq: Once | ORAL | 0 refills | Status: AC

## 2019-03-04 MED ORDER — METRONIDAZOLE 500 MG PO TABS: 500.0000 mg | ORAL_TABLET | Freq: Two times a day (BID) | ORAL | 0 refills | Status: DC

## 2019-03-04 NOTE — MAU Provider Note (Addendum)
History     CSN: [id number]  Arrival date and time: 03/04/19 1120   First Provider Initiated Contact with Patient 03/04/19 1207      Chief Complaint  Patient presents with  . Abdominal Pain   HPI  23 yo G3P2012 presents to MAU with vaginal discharge and LLQ. Patient with recent miscarriage around 11/12. Reports no vaginal bleeding in past 5 days. She reports vaginal itching, smell and discharge. No sexual intercourse for past 10 days but does report hx of STD and desires GC/Chlam testing today. Reports still feeling some breast tenderness and occasional cramping and feels like "she is still pregnant at time" which is confusing to her. Occasional nausea but tolerating PO. Denies fever, chills, headache, SOB, cough, vomiting, diarrhea, constipation, urinary symptoms, back pain, hematuria, LE swelling or any other complaint.   OB History    Gravida  3   Para  2   Term  2   Preterm  0   AB  1   Living  2     SAB  1   TAB  0   Ectopic  0   Multiple  0   Live Births  2           Past Medical History:  Diagnosis Date  . Anemia    hx of, but not during pregnancy  . Anxiety    "was on medication long time ago"; pt states  . Bipolar 1 disorder ([place])    "was on medication a long time ago" pt states  . Chlamydia   . Genital herpes   . Gonorrhea   . Substance abuse [facility])     Past Surgical History:  Procedure Laterality Date  . MOUTH SURGERY      Family History  Problem Relation Age of Onset  . Diabetes Maternal Grandfather        type II  . Cancer Maternal Grandfather   . Heart disease Maternal Grandmother   . Heart disease Mother     Social History   Tobacco Use  . Smoking status: Former Smoker    Types: Cigars  . Smokeless tobacco: Never Used  . Tobacco comment: Hookah  Substance Use Topics  . Alcohol use: Not Currently    Comment: last use 02/12/2019  . Drug use: Not Currently    Types: Marijuana    Comment: Last use- 02/12/2019     Allergies: No Known Allergies  Medications Prior to Admission  Medication Sig Dispense Refill Last Dose  . Prenatal Vit-Fe Fumarate-FA (PRENATAL MULTIVITAMIN) TABS tablet Take 1 tablet by mouth daily at 12 noon.     . valACYclovir (VALTREX) 500 MG tablet Take 500 mg by mouth 2 (two) times daily. Take 2 tabs twice a day       Review of Systems  All other systems negative unless noted above in HPI.   Physical Exam   Blood pressure 140/77, pulse (!) 55, temperature 98.3 F (36.8 C), temperature source Oral, resp. rate 18, SpO2 100 %, unknown if currently breastfeeding.  Physical Exam  BP 140/77 (BP Location: Right Arm)   Pulse (!) 55   Temp 98.3 F (36.8 C) (Oral)   Resp 18   SpO2 100% Comment: room air CONSTITUTIONAL: Well-developed, well-nourished female in no acute distress.  HENT:  Normocephalic, atraumatic, External right and left ear normal.  EYES: Conjunctivae and EOM are normal. No scleral icterus.  NECK: Normal range of motion. SKIN: Skin is warm and dry. No rash noted. Not  diaphoretic. No erythema. No pallor. MUSCULOSKELETAL: Normal range of motion.  NEUROLOGIC: Alert and oriented to person, place, and time. Normal reflexes, muscle tone coordination.  PSYCHIATRIC: Normal mood and affect. Normal behavior. Normal judgment and thought content. CARDIOVASCULAR: Normal heart rate noted RESPIRATORY: Effort and breath sounds normal, no problems with respiration noted. ABDOMEN: Soft, no distention noted.  No rebound or guarding. Minimal left suprapubic tenderness noted. PELVIC: Normal appearing external genitalia and urethral meatus; normal appearing vaginal mucosa and cervix.  Thick white discharge present with fishy odor. Normal uterine size, no other palpable masses, no uterine or adnexal tenderness.  MAU Course  Procedures  MDM Patient presents s/p SAB in last 2 weeks. Main complaint vaginal discharge and desires STD testing. No vaginal bleeding. Discharge noted on  exam; BV on wet prep. GC/Chlam pending. UA with large leuks and WBC's present. Culture ordered; no tx at this time as patient currently without obvious urine symptoms; tx pending culture results. Some left suprapubic pain; intermittent. Declined any pain medication. Patient also with Edinburgh score 10. Reports hx of PP depression. No SI currently and reports November has been "a good month for her." Declined behavioral health referral.   Assessment and Plan  Vaginal discharge: Bacterial Vaginosis: Flagyl sent to pharmacy. Fluconazole tx sent per patient request due to hx of yeast infection after BV treatment. STD tests pending UA Cx pending; will follow and send script as needed  Return precautions given. Patient verbalized understanding of discharge and follow-up instructions and was ambulating without assistance upon discharge.  Beadie Matsunaga N Tashima Scarpulla 03/04/2019, 1:48 PM

## 2019-03-04 NOTE — MAU Note (Signed)
PT is s/p early pregnancy miscarriage on 11/12. Repeat HCG on 11/16 was 2. She is worried because she is still  having some pregnancy symptoms, and intermittent left-sided abdominal pain. The bleeding has subsided. She also has a suspected yeast infection.

## 2019-03-05 LAB — CULTURE, OB URINE: Culture: <10000 — AB

## 2019-03-07 LAB — GC/CHLAMYDIA PROBE AMP (~~LOC~~) NOT AT ARMC
Chlamydia: NEGATIVE
Comment: NEGATIVE
Comment: NORMAL
Neisseria Gonorrhea: NEGATIVE

## 2019-03-14 ENCOUNTER — Other Ambulatory Visit: Payer: Self-pay

## 2019-03-14 ENCOUNTER — Encounter: Payer: Self-pay | Admitting: Advanced Practice Midwife

## 2019-03-14 ENCOUNTER — Ambulatory Visit (INDEPENDENT_AMBULATORY_CARE_PROVIDER_SITE_OTHER): Payer: BC Managed Care – PPO | Admitting: Advanced Practice Midwife

## 2019-03-14 ENCOUNTER — Other Ambulatory Visit (HOSPITAL_COMMUNITY)
Admission: RE | Admit: 2019-03-14 | Discharge: 2019-03-14 | Disposition: A | Discharge status: Home / Self Care | Payer: BC Managed Care – PPO | Source: Ambulatory Visit | Attending: Advanced Practice Midwife | Admitting: Advanced Practice Midwife

## 2019-03-14 VITALS — BP 118/75 | HR 69 | Wt 170.2 lb

## 2019-03-14 DIAGNOSIS — O039 Complete or unspecified spontaneous abortion without complication: Secondary | ICD-10-CM

## 2019-03-14 DIAGNOSIS — Z3009 Encounter for other general counseling and advice on contraception: Secondary | ICD-10-CM

## 2019-03-14 DIAGNOSIS — Z3041 Encounter for surveillance of contraceptive pills: Secondary | ICD-10-CM

## 2019-03-14 DIAGNOSIS — Z8759 Personal history of other complications of pregnancy, childbirth and the puerperium: Secondary | ICD-10-CM

## 2019-03-14 DIAGNOSIS — N898 Other specified noninflammatory disorders of vagina: Secondary | ICD-10-CM

## 2019-03-14 DIAGNOSIS — Z5189 Encounter for other specified aftercare: Secondary | ICD-10-CM | POA: Diagnosis not present

## 2019-03-14 MED ORDER — NORETHIN-ETH ESTRAD-FE BIPHAS 1 MG-10 MCG / 10 MCG PO TABS: 1.0000 | ORAL_TABLET | Freq: Every day | ORAL | 11 refills | Status: DC

## 2019-03-14 NOTE — Progress Notes (Signed)
Pt is here to discuss contraception after SAB earlier this month. Pt reports she just finished medication for BV, she would like to be rechecked today.

## 2019-03-14 NOTE — Patient Instructions (Signed)
Some natural remedies/prevention to try for bacterial vaginosis: --Take a probiotic tablet/capsule every day for at least 1-2 months.   --Whenever you have symptoms, use boric acid suppositories vaginally every night for a week.   --Do not use scented soaps/perfumes in the vaginal area, and do not overwash multiple times daily. --Wear breathable cotton underwear and do not wear tight restrictive clothing. --Limit pantyliner use, change your underwear several times daily instead. --Use condoms during intercourse.    Psychiatric Services [facility] of Care  [address], [place], [place]  [place] [address] [place], [place] [phone] or [phone] [email or web address]  [facility], 8:30-5:00 [address], [place], [place] [email or web address]  *Bring your own interpreter at 1st visit  [place] [address] [place], [place], [place] [email or web address]   Psychotherapeutic Services/ACT Services  [address] [place], [place]  [facility] Mon-Fri, 8am-3pm [address], [place], [place] [email or web address]  [place], [place], [place]  [place] [address], [place], [place]  [facility] of the [place] [address] [place], [place] que hablen espanol, favor comunicarse con el Sr. [place], extension 2[address] o la [address], extension [place] para hacer una cita.   [facility] [address] "[place]" [phone] (Habla Espanol)  [facility] [place], [place], [place]  [place] [address], [place], [place]   [facility] Survivors)  [address] [place], [place], [place] [email or web address]  *Uninsured and underinsured, ages 19-64  [facility] [place], [place], [place] ([place])  [facility] [address], [place], [place] (Habla Espanol)  [facility] [address] [place], [place] Cinda Quest)  [place] 8:30am-8:00pm/ Fri 8:30-7:00pm [address], [place], [place] (3rd floor, located at corner of [address] and [address]) Call [phone] to schedule an appointment [email or web address]  [facility] [address] [place], [place]  [place], [place], [place]  [place] ([place]) [phone] or [email or web address] [address] [place], [place], [place] [address] * Recovery support and educational programs, including recovery skills classes, support groups, and one-on-one sessions with  Certified Peer Support Specialists.    [facility] ([place]) [place]: [phone] * Family and [place] at [phone] for more information * Family to [employer] and Basics Class : enroll online or email Mellody Dance at [email or web address]  * Monthly educational meetings, contact [place] at [phone] [email or web address]    24- Hour Availability:  *[place] [phone] or [phone]  * Family Service of the [employer] (Domestic Violence, Rape, Victim Assistance) Line [phone]  [place] Sessions [phone] or [phone]  * [facility] [employer]   [phone] [phone] hours)  *[facility] Mobile Crisis Unit [phone]  *[place] National Suicide Hotline 641-819-2059 \

## 2019-03-14 NOTE — Progress Notes (Signed)
  GYNECOLOGY PROGRESS NOTE  History:  23 y.o. [id number] presents to [facility] office today for f/u of SAB. She presented to MAU on 11/12 with hCG of 12 and again on 11/16 with hCG of 2. She presented to MAU again on 11/20 for LLQ pain, vaginal itching/smell/discharge. She was diagnosed with BV and discharged with flagyl.  Today she reports no complaints. Patient reports her last vaginal bleeding was on 11/15. She denies vaginal itching/smell/discharge/pain, pelvic pain, abdominal pain, h/a, dizziness, shortness of breath, n/v, or fever/chills.    Patient also would like OCP's prescribed today and would like to be tested for BV again due to her extensive Hx of BV. Patient reports finishing full course of flagyl.   The following portions of the patient's history were reviewed and updated as appropriate: allergies, current medications, past family history, past medical history, past social history, past surgical history and problem list.  Review of Systems:  Pertinent items are noted in HPI.   Objective:  Physical Exam Blood pressure 118/75, pulse 69, weight 77.2 kg, last menstrual period 01/20/2019, not currently breastfeeding. VS reviewed, nursing note reviewed,  Constitutional: well developed, well nourished, no distress HEENT: normocephalic CV: normal rate Pulm/chest wall: normal effort Breast Exam: deferred Abdomen: soft Neuro: alert and oriented x 3 Skin: warm, dry Psych: affect normal Pelvic exam: Deferred, external genitalia normal upon wet prep sample collection  Assessment & Plan:  1. Vaginal discharge - Patient denies any abnormal vaginal discharge at this time. Patient reports finishing full course of flagyl but would like to be retested for BV. Wet prep sample taken today. Educational counseling regarding BV prevention given. - Cervicovaginal ancillary only( [place])  2. Follow-up visit after miscarriage - No vaginal bleeding or pelvic/abd pain today. Patient reports doing  well.  - Edinburgh score was 10 on 11/20. [place] questionnaires asked again today, patient reports denies all and reports doing much better.   3. Encounter for general counseling and advice on contraceptive management - Patient would like OCP's as she wants regular menstrual cycles. No personal or family history of DVT/PE, no migraines w/ aura, none smoker, no additional risk for OCPs - Norethindrone-Ethinyl Estradiol-Fe Biphas (LO LOESTRIN FE) 1 MG-10 MCG / 10 MCG tablet; Take 1 tablet by mouth daily.  Dispense: 1 Package; Refill: 11  Next visit : as needed, otherwise in 1year   Hessie Dibble, Student-PA 2:16 PM

## 2019-03-15 LAB — CERVICOVAGINAL ANCILLARY ONLY
Bacterial Vaginitis (gardnerella): NEGATIVE
Candida Glabrata: NEGATIVE
Candida Vaginitis: NEGATIVE
Chlamydia: NEGATIVE
Comment: NEGATIVE
Comment: NEGATIVE
Comment: NEGATIVE
Comment: NEGATIVE
Comment: NEGATIVE
Comment: NORMAL
Neisseria Gonorrhea: NEGATIVE
Trichomonas: NEGATIVE

## 2019-03-22 ENCOUNTER — Encounter (HOSPITAL_COMMUNITY): Payer: Self-pay | Admitting: *Deleted

## 2019-03-22 ENCOUNTER — Inpatient Hospital Stay (HOSPITAL_COMMUNITY)
Admission: AD | Admit: 2019-03-22 | Discharge: 2019-03-22 | Disposition: A | Discharge status: Home / Self Care | Payer: BC Managed Care – PPO | Attending: Obstetrics & Gynecology | Admitting: Obstetrics & Gynecology

## 2019-03-22 ENCOUNTER — Other Ambulatory Visit: Payer: Self-pay

## 2019-03-22 DIAGNOSIS — Z3201 Encounter for pregnancy test, result positive: Secondary | ICD-10-CM | POA: Insufficient documentation

## 2019-03-22 LAB — URINALYSIS, ROUTINE W REFLEX MICROSCOPIC
Bilirubin Urine: NEGATIVE
Glucose, UA: NEGATIVE mg/dL
Hgb urine dipstick: NEGATIVE
Ketones, ur: NEGATIVE mg/dL
Leukocytes,Ua: NEGATIVE
Nitrite: NEGATIVE
Protein, ur: NEGATIVE mg/dL
Specific Gravity, Urine: 1.004 — ABNORMAL LOW (ref 1.005–1.030)
pH: 7 (ref 5.0–8.0)

## 2019-03-22 LAB — HCG, QUANTITATIVE, PREGNANCY: hCG, Beta Chain, Quant, S: 84 m[IU]/mL — ABNORMAL HIGH (ref <5)

## 2019-03-22 NOTE — Discharge Instructions (Signed)

## 2019-03-22 NOTE — MAU Note (Signed)
.   Anita Sims is a 23 y.o. at Unknown here in MAU reporting: that she had a positive pregnancy test today, Denies any vaginal bleeding or pain LMP: 02/24/19 Onset of complaint:  Pain score: 0  Vitals:   03/22/19 1755  BP: 138/81  Pulse: 74  Resp: 16  Temp: (!) 97.4 F (36.3 C)     FHT: Lab orders placed from triage: UA/UPT

## 2019-03-22 NOTE — MAU Provider Note (Signed)
First Provider Initiated Contact with Patient 03/22/19 1824      S Ms. Anita Sims is a 23 y.o. [phone] non-pregnant female who presents to MAU today with complaint of positive pregnancy test after a miscarriage. Pt denies pain, bleeding, abnormal discharge or other symptoms today. Pt was being followed for a miscarriage through hCGs at MAU and had an hCG of 2 on 02/28/2019. Pt was seen in the office for a f/u post-SAB on 03/14/2019 and was prescribed OCPs that she has not started taking.  O BP 138/81 (BP Location: Right Arm)   Pulse 74   Temp (!) 97.4 F (36.3 C)   Resp 16   LMP 02/24/2019    Patient Vitals for the past 24 hrs:  BP Temp Pulse Resp  03/22/19 1755 138/81 (!) 97.4 F (36.3 C) 74 16   Physical Exam  Constitutional: She is oriented to person, place, and time. She appears well-developed and well-nourished. No distress.  HENT:  Head: Normocephalic and atraumatic.  Respiratory: Effort normal.  Neurological: She is alert and oriented to person, place, and time.  Skin: She is not diaphoretic.  Psychiatric: She has a normal mood and affect. Her behavior is normal. Judgment and thought content normal.   A Pregnant female hCG today 39 Medical screening exam complete  P Discharge from MAU in stable condition Appt scheduled @[facility]  for 03/25/2019 @0830AM    Warning signs for worsening condition that would warrant emergency follow-up discussed Patient may return to MAU as needed for pregnancy related complaints  Antonea Gaut, Gerrie Nordmann, NP 03/22/2019 7:37 PM

## 2019-03-23 LAB — POCT PREGNANCY, URINE: Preg Test, Ur: POSITIVE — AB

## 2019-03-25 ENCOUNTER — Other Ambulatory Visit: Payer: Self-pay

## 2019-03-25 ENCOUNTER — Ambulatory Visit (INDEPENDENT_AMBULATORY_CARE_PROVIDER_SITE_OTHER): Payer: BC Managed Care – PPO | Admitting: General Practice

## 2019-03-25 DIAGNOSIS — Z3491 Encounter for supervision of normal pregnancy, unspecified, first trimester: Secondary | ICD-10-CM

## 2019-03-25 DIAGNOSIS — O3680X0 Pregnancy with inconclusive fetal viability, not applicable or unspecified: Secondary | ICD-10-CM

## 2019-03-25 DIAGNOSIS — Z8759 Personal history of other complications of pregnancy, childbirth and the puerperium: Secondary | ICD-10-CM

## 2019-03-25 DIAGNOSIS — O039 Complete or unspecified spontaneous abortion without complication: Secondary | ICD-10-CM

## 2019-03-25 LAB — BETA HCG QUANT (REF LAB): hCG Quant: 310 m[IU]/mL

## 2019-03-25 NOTE — Progress Notes (Signed)
Patient presents to office today for stat bhcg following recent MAU visit on 12/8. Patient denies pain or bleeding. Discussed with patient we are monitoring your bhcg levels today, results will be reviewed with a provider and we will call you with results/updated plan of care. Patient verbalized understanding & provided callback number [phone].  Reviewed results with Dr Rip Harbour (Dr Ilda Basset unavailable) who finds appropriate rise in bhcg levels- patient should have follow up ultrasound in 14 days for dating/viability. Scheduled 12/28 @ 1pm.   Called patient and informed her of results, ultrasound appt, & reviewed ectopic precautions. Patient verbalized understanding & had no questions.  Koren Bound RN BSN 03/25/19

## 2019-03-28 NOTE — Progress Notes (Signed)
Agree with A & P. 

## 2019-04-06 ENCOUNTER — Encounter (HOSPITAL_COMMUNITY): Payer: Self-pay | Admitting: Obstetrics and Gynecology

## 2019-04-06 ENCOUNTER — Inpatient Hospital Stay (HOSPITAL_COMMUNITY)
Admission: AD | Admit: 2019-04-06 | Discharge: 2019-04-06 | Disposition: A | Discharge status: Home / Self Care | Payer: BC Managed Care – PPO | Attending: Obstetrics and Gynecology | Admitting: Obstetrics and Gynecology

## 2019-04-06 ENCOUNTER — Other Ambulatory Visit: Payer: Self-pay

## 2019-04-06 DIAGNOSIS — O23591 Infection of other part of genital tract in pregnancy, first trimester: Secondary | ICD-10-CM

## 2019-04-06 DIAGNOSIS — O26891 Other specified pregnancy related conditions, first trimester: Secondary | ICD-10-CM | POA: Diagnosis present

## 2019-04-06 DIAGNOSIS — Z3A01 Less than 8 weeks gestation of pregnancy: Secondary | ICD-10-CM | POA: Insufficient documentation

## 2019-04-06 DIAGNOSIS — N760 Acute vaginitis: Secondary | ICD-10-CM

## 2019-04-06 DIAGNOSIS — Z87891 Personal history of nicotine dependence: Secondary | ICD-10-CM | POA: Diagnosis not present

## 2019-04-06 DIAGNOSIS — B9689 Other specified bacterial agents as the cause of diseases classified elsewhere: Secondary | ICD-10-CM | POA: Diagnosis not present

## 2019-04-06 LAB — URINALYSIS, ROUTINE W REFLEX MICROSCOPIC
Bacteria, UA: NONE SEEN
Bilirubin Urine: NEGATIVE
Glucose, UA: NEGATIVE mg/dL
Hgb urine dipstick: NEGATIVE
Ketones, ur: NEGATIVE mg/dL
Nitrite: NEGATIVE
Protein, ur: NEGATIVE mg/dL
Specific Gravity, Urine: 1.001 — ABNORMAL LOW (ref 1.005–1.030)
pH: 7 (ref 5.0–8.0)

## 2019-04-06 LAB — WET PREP, GENITAL
Sperm: NONE SEEN
Trich, Wet Prep: NONE SEEN
Yeast Wet Prep HPF POC: NONE SEEN

## 2019-04-06 MED ORDER — METRONIDAZOLE 500 MG PO TABS: 500.0000 mg | ORAL_TABLET | Freq: Two times a day (BID) | ORAL | 0 refills | Status: DC

## 2019-04-06 MED ORDER — TERCONAZOLE 0.4 % VA CREA: 1.0000 | TOPICAL_CREAM | Freq: Every day | VAGINAL | 0 refills | Status: AC

## 2019-04-06 NOTE — MAU Note (Signed)
.   Anita Sims is a 23 y.o. at [redacted]w[redacted]d here in MAU reporting: she thinks she may have BV again. White vaginal  Discharge with foul odor. Denies pain LMP: 02/24/19 Onset of complaint: 3 days Pain score: 0 Vitals:   04/06/19 1007  BP: 110/65  Pulse: 70  Resp: 16  Temp: 97.6 F (36.4 C)     FHT:  Lab orders placed from triage: UA

## 2019-04-06 NOTE — Discharge Instructions (Signed)
Alternative Vaginitis Therapies  1) soak in tub of warm water waist high with 1/2 cup of baking soda in water for ~ 20 mins. 2) soak 3 tampons in 1 tablespoon of fractionated (liquid form) coconut oil with 10 drops of Melaleuca (Tea Tree) essential oil, insert 1 saturated tampon vaginally at bedtime x 3 days. Both options are to be done after sexual intercourse, menses and when suspects BV and/or yeast infection. Advised that these alternatives will not replace the need to be evaluated, if sx's persist. You will need to seek care at an OB/GYN provider.  GO WHITE: Soap: UNSCENTED Dove (white box light green writing) Laundry detergent (underwear)- Dreft or [employer] unscented WHITE 100% cotton panties (NOT just cotton crouch) Sanitary napkin/panty liners: UNSCENTED.  If it doesn't SAY unscented it can have a scent/perfume    NO PERFUMES OR LOTIONS OR POTIONS in the vulvar area (may use regular [place]) Condoms: hypoallergenic only. Non dyed (no color) Toilet papers: white only Wash clothes: use a separate wash cloth. WHITE.  Wash in Dreft.   You can purchase Tea Tree Oil locally at:  [employer] [address] [place], [place] [address] [phone]   [employer] [address] [place], [place] [address] [phone]  

## 2019-04-06 NOTE — MAU Provider Note (Signed)
History     CSN: [id number]  Arrival date and time: 04/06/19 1761   First Provider Initiated Contact with Patient 04/06/19 1112      Chief Complaint  Patient presents with  . Vaginal Discharge   HPI  Ms.  Anita Sims is a 23 y.o. year old G23P2012 female at [redacted]w[redacted]d weeks gestation who presents to MAU reporting white vaginal discharge discharge with a foul odor x 3 days. She reports that she continues to have bacterial vaginosis after sex. She recently had SI and now having the discharge and odor. Patient has no pregnancy complaints today.  Past Medical History:  Diagnosis Date  . Anemia    hx of, but not during pregnancy  . Anxiety    "was on medication long time ago"; pt states  . Bipolar 1 disorder ([place])    "was on medication a long time ago" pt states  . Chlamydia   . Genital herpes   . Gonorrhea   . Substance abuse [facility])     Past Surgical History:  Procedure Laterality Date  . MOUTH SURGERY      Family History  Problem Relation Age of Onset  . Diabetes Maternal Grandfather        type II  . Cancer Maternal Grandfather   . Heart disease Maternal Grandmother   . Heart disease Mother     Social History   Tobacco Use  . Smoking status: Former Smoker    Types: Cigars  . Smokeless tobacco: Never Used  . Tobacco comment: Hookah  Substance Use Topics  . Alcohol use: Not Currently    Comment: last use 02/12/2019  . Drug use: Not Currently    Types: Marijuana    Comment: Last use- 02/12/2019    Allergies: No Known Allergies  Medications Prior to Admission  Medication Sig Dispense Refill Last Dose  . Prenatal Vit-Fe Fumarate-FA (PRENATAL MULTIVITAMIN) TABS tablet Take 1 tablet by mouth daily at 12 noon.   04/06/2019 at Unknown time  . metroNIDAZOLE (FLAGYL) 500 MG tablet Take 1 tablet (500 mg total) by mouth 2 (two) times daily. (Patient not taking: Reported on 03/14/2019) 14 tablet 0   . valACYclovir (VALTREX) 500 MG tablet Take 500 mg by mouth 2  (two) times daily. Take 2 tabs twice a day       Review of Systems  Constitutional: Negative.   HENT: Negative.   Eyes: Negative.   Respiratory: Negative.   Cardiovascular: Negative.   Gastrointestinal: Negative.   Endocrine: Negative.   Genitourinary: Positive for vaginal discharge (with odor).  Musculoskeletal: Negative.   Skin: Negative.   Allergic/Immunologic: Negative.   Neurological: Negative.   Hematological: Negative.   Psychiatric/Behavioral: Negative.    Physical Exam   Blood pressure 110/65, pulse 70, temperature 97.6 F (36.4 C), resp. rate 16, last menstrual period 02/24/2019, not currently breastfeeding.  Physical Exam  Nursing note and vitals reviewed. Constitutional: She is oriented to person, place, and time. She appears well-developed and well-nourished.  HENT:  Head: Normocephalic and atraumatic.  Eyes: Pupils are equal, round, and reactive to light.  Cardiovascular: Normal rate.  Respiratory: Effort normal.  GI: Soft.  Genitourinary:    Genitourinary Comments: Wet prep collected by self-swab   Musculoskeletal:        General: Normal range of motion.     Cervical back: Normal range of motion.  Neurological: She is alert and oriented to person, place, and time.  Skin: Skin is warm and dry.  Psychiatric: She  has a normal mood and affect. Her behavior is normal. Judgment and thought content normal.    MAU Course  Procedures  MDM CCUA Wet Prep  Results for orders placed or performed during the hospital encounter of 04/06/19 (from the past 24 hour(s))  Urinalysis, Routine w reflex microscopic     Status: Abnormal   Collection Time: 04/06/19 10:13 AM  Result Value Ref Range   Color, Urine COLORLESS (A) YELLOW   APPearance CLEAR CLEAR   Specific Gravity, Urine 1.001 (L) 1.005 - 1.030   pH 7.0 5.0 - 8.0   Glucose, UA NEGATIVE NEGATIVE mg/dL   Hgb urine dipstick NEGATIVE NEGATIVE   Bilirubin Urine NEGATIVE NEGATIVE   Ketones, ur NEGATIVE NEGATIVE  mg/dL   Protein, ur NEGATIVE NEGATIVE mg/dL   Nitrite NEGATIVE NEGATIVE   Leukocytes,Ua MODERATE (A) NEGATIVE   RBC / HPF 0-5 0 - 5 RBC/hpf   WBC, UA 0-5 0 - 5 WBC/hpf   Bacteria, UA NONE SEEN NONE SEEN   Squamous Epithelial / LPF 0-5 0 - 5  Wet Prep     Status: Abnormal   Collection Time: 04/06/19 10:18 AM  Result Value Ref Range   Yeast Wet Prep HPF POC NONE SEEN NONE SEEN   Trich, Wet Prep NONE SEEN NONE SEEN   Clue Cells Wet Prep HPF POC PRESENT (A) NONE SEEN   WBC, Wet Prep HPF POC MANY (A) NONE SEEN   Sperm NONE SEEN      Assessment and Plan  Bacterial vaginosis  - Rx Flagyl 500 mg BID x 7 days refill sent - Rx for Terazol cream sent per request of patient - Information provided on BV   - Discharge patient - Keep scheduled appt with [facility] on 04/11/2019 - Patient verbalized an understanding of the plan of care and agrees.     Raelyn Mora, MSN, CNM 04/06/2019, 11:12 AM

## 2019-04-11 ENCOUNTER — Ambulatory Visit (INDEPENDENT_AMBULATORY_CARE_PROVIDER_SITE_OTHER): Payer: BC Managed Care – PPO

## 2019-04-11 ENCOUNTER — Ambulatory Visit (HOSPITAL_COMMUNITY)
Admission: RE | Admit: 2019-04-11 | Discharge: 2019-04-11 | Disposition: A | Discharge status: Home / Self Care | Payer: BC Managed Care – PPO | Source: Ambulatory Visit | Attending: Obstetrics and Gynecology | Admitting: Obstetrics and Gynecology

## 2019-04-11 ENCOUNTER — Other Ambulatory Visit: Payer: Self-pay

## 2019-04-11 DIAGNOSIS — Z712 Person consulting for explanation of examination or test findings: Secondary | ICD-10-CM

## 2019-04-11 DIAGNOSIS — Z3491 Encounter for supervision of normal pregnancy, unspecified, first trimester: Secondary | ICD-10-CM | POA: Diagnosis not present

## 2019-04-11 NOTE — Progress Notes (Signed)
Patient seen and assessed by nursing staff.  Agree with documentation and plan.  

## 2019-04-11 NOTE — Progress Notes (Signed)
Pt here today following [place] for pregnancy viability and dating purposes. Results and pt hx reviewed with Kennon Rounds, MD who finds single living IUP. Kennon Rounds, MD recommends pt begin prenatal care.    Reviewed results and dating with pt. EDD 12/03/19; pt is 6w 2d today. Pt encouraged to begin prenatal care at [facility] office where she is seen. Medications and allergies reviewed; list of medications safe to take during pregnancy given. [place] office notified to provide proof of pregnancy letter.   Apolonio Schneiders RN 04/11/19

## 2019-05-16 ENCOUNTER — Encounter: Payer: Self-pay | Admitting: Certified Nurse Midwife

## 2019-05-16 ENCOUNTER — Other Ambulatory Visit (HOSPITAL_COMMUNITY)
Admission: RE | Admit: 2019-05-16 | Discharge: 2019-05-16 | Disposition: A | Discharge status: Home / Self Care | Payer: BC Managed Care – PPO | Source: Ambulatory Visit | Attending: Certified Nurse Midwife | Admitting: Certified Nurse Midwife

## 2019-05-16 ENCOUNTER — Other Ambulatory Visit: Payer: Self-pay

## 2019-05-16 ENCOUNTER — Ambulatory Visit (INDEPENDENT_AMBULATORY_CARE_PROVIDER_SITE_OTHER): Payer: BC Managed Care – PPO | Admitting: Certified Nurse Midwife

## 2019-05-16 VITALS — BP 115/77 | HR 69 | Wt 166.0 lb

## 2019-05-16 DIAGNOSIS — B009 Herpesviral infection, unspecified: Secondary | ICD-10-CM | POA: Insufficient documentation

## 2019-05-16 DIAGNOSIS — Z23 Encounter for immunization: Secondary | ICD-10-CM | POA: Diagnosis not present

## 2019-05-16 DIAGNOSIS — O98519 Other viral diseases complicating pregnancy, unspecified trimester: Secondary | ICD-10-CM

## 2019-05-16 DIAGNOSIS — Z3481 Encounter for supervision of other normal pregnancy, first trimester: Secondary | ICD-10-CM

## 2019-05-16 DIAGNOSIS — F1290 Cannabis use, unspecified, uncomplicated: Secondary | ICD-10-CM | POA: Insufficient documentation

## 2019-05-16 DIAGNOSIS — Z3491 Encounter for supervision of normal pregnancy, unspecified, first trimester: Secondary | ICD-10-CM | POA: Diagnosis present

## 2019-05-16 DIAGNOSIS — O98511 Other viral diseases complicating pregnancy, first trimester: Secondary | ICD-10-CM | POA: Insufficient documentation

## 2019-05-16 DIAGNOSIS — Z3490 Encounter for supervision of normal pregnancy, unspecified, unspecified trimester: Secondary | ICD-10-CM

## 2019-05-16 DIAGNOSIS — B9689 Other specified bacterial agents as the cause of diseases classified elsewhere: Secondary | ICD-10-CM

## 2019-05-16 DIAGNOSIS — N760 Acute vaginitis: Secondary | ICD-10-CM

## 2019-05-16 DIAGNOSIS — Z3A11 11 weeks gestation of pregnancy: Secondary | ICD-10-CM

## 2019-05-16 MED ORDER — TERCONAZOLE 0.4 % VA CREA: 1.0000 | TOPICAL_CREAM | Freq: Every day | VAGINAL | 0 refills | Status: DC

## 2019-05-16 MED ORDER — BLOOD PRESSURE MONITOR KIT: 1.0000 | PACK | 0 refills | Status: DC

## 2019-05-16 MED ORDER — METRONIDAZOLE 500 MG PO TABS: 500.0000 mg | ORAL_TABLET | Freq: Two times a day (BID) | ORAL | 0 refills | Status: DC

## 2019-05-16 NOTE — Progress Notes (Signed)
NOB  Last pap: N/A pt unsure  Genetic Testing: Declined  Flu Vaccine given and handout given No complaints after injection.    CC: Unsure of LMP

## 2019-05-16 NOTE — Progress Notes (Signed)
History:   Anita Sims is a 24 y.o. P3X9024 at 57w4dby LMP being seen today for her first obstetrical visit.  Her obstetrical history is significant for marijuana user. Patient does intend to breast feed and formula feed. Pregnancy history fully reviewed.  Patient reports nausea.     HISTORY: OB History  Gravida Para Term Preterm AB Living  4 2 2  0 1 2  SAB TAB Ectopic Multiple Live Births  1 0 0 0 2    # Outcome Date GA Lbr Len/2nd Weight Sex Delivery Anes PTL Lv  4 Current           3 SAB 02/2019          2 Term 11/16/18 350w6d4:44 / 00:02 8 lb 4.8 oz (3.765 kg) M Vag-Spont None  LIV     Name: Bridwell,BOY Merit     Apgar1: 8  Apgar5: 9  1 Term 09/01/17 3756w1d:21 / 00:12 7 lb 0.2 oz (3.181 kg) F Vag-Spont None  LIV     Name: Seckel,GIRL Vernette     Apgar1: 9  Apgar5: 9     Past Medical History:  Diagnosis Date  . Anemia    hx of, but not during pregnancy  . Anxiety    "was on medication long time ago"; pt states  . [place] 1 [place]  "was on medication a long time ago" pt states  . Chlamydia   . Genital herpes   . Gonorrhea   [facility]  Past Surgical History:  Procedure Laterality Date  . MOUTH SURGERY     Family History  Problem Relation Age of Onset  . Diabetes Maternal Grandfather        type II  . Cancer Maternal Grandfather   . Heart disease Maternal Grandmother   . Heart disease Mother    Social History   Tobacco Use  . Smoking status: Former Smoker    Types: Cigars  . Smokeless tobacco: Never Used  . Tobacco comment: Hookah  Substance Use Topics  . Alcohol use: Not Currently    Comment: last use 02/12/2019  . Drug use: Not Currently    Types: Marijuana    Comment: Last use- 02/12/2019   No Known Allergies Current Outpatient Medications on File Prior to Visit  Medication Sig Dispense Refill  . metroNIDAZOLE (FLAGYL) 500 MG tablet Take 1 tablet (500 mg total) by mouth 2 (two) times daily. (Patient  not taking: Reported on 05/16/2019) 14 tablet 0  . Multiple Vitamin (MULTIVITAMIN) tablet Take 1 tablet by mouth daily.    . valACYclovir (VALTREX) 500 MG tablet Take 500 mg by mouth 2 (two) times daily. Take 2 tabs twice a day     No current facility-administered medications on file prior to visit.    Review of Systems Pertinent items noted in HPI and remainder of comprehensive ROS otherwise negative. Physical Exam:   Vitals:   05/16/19 1312  BP: 115/77  Pulse: 69  Weight: 166 lb (75.3 kg)   Fetal Heart Rate (bpm): 161 Pelvic Exam: Perineum: no hemorrhoids, normal perineum   Vulva: normal external genitalia, no lesions   Vagina:  normal mucosa, copious amount of white thin discharge    Cervix: no lesions and ectropic cervix, pap smear done.    Adnexa: normal adnexa and no mass, fullness, tenderness   Bony Pelvis: average  System: General: well-developed, well-nourished female in no acute distress   Breasts:  normal appearance, no masses or tenderness bilaterally   Skin: normal coloration and turgor, no rashes   Neurologic: oriented, normal, negative, normal mood   Extremities: normal strength, tone, and muscle mass, ROM of all joints is normal   HEENT PERRLA, extraocular movement intact and sclera clear   Mouth/Teeth mucous membranes moist, pharynx normal without lesions and dental hygiene good   Neck supple and no masses   Cardiovascular: regular rate and rhythm   Respiratory:  no respiratory distress, normal breath sounds   Abdomen: soft, non-tender; bowel sounds normal; no masses,  no organomegaly     Assessment:    Pregnancy: B1D1761 Patient Active Problem List   Diagnosis Date Noted  . Supervision of normal pregnancy 05/16/2019  . Marijuana user 05/16/2019  . Herpes simplex type 2 (HSV-2) infection affecting pregnancy, antepartum 05/16/2019  . Bacterial vaginitis 04/06/2019  . Miscarriage within last 12 months 03/14/2019  . Polysubstance abuse ([place]) 09/01/2017  .  Elevated BP without diagnosis of hypertension 09/01/2017     Plan:    1. Encounter for supervision of normal pregnancy, antepartum, unspecified gravidity - Patient doing well, reports occasional nausea but declines medication at this time, denies vomiting  - Routine prenatal care - Anticipatory guidance on upcoming appointments  - Reviewed safety, visitor policy, reassurance about COVID-19 for pregnancy at this time. Discussed possible changes to visits, including televisits, that may occur due to COVID-19.  The office remains open if pt needs to be seen and MAU is open 24 hours/day for OB emergencies. - Obstetric Panel, Including HIV - Culture, OB Urine - Cytology - PAP - Enroll Patient in Babyscripts - Babyscripts Schedule Optimization - Blood Pressure Monitor KIT; 1 Device by Does not apply route once a week. To be monitored Regularly at home.  Dispense: 1 kit; Refill: 0 - [place] MFM OB COMP + 14 WK; Future  2. Bacterial vaginosis - Patient reports hx of bacterial vaginosis  - Was using boric acid tablet for treatment - encouraged to stop use of tablets, patient reports she last used tablet yesterday  - Will treat for BV today based on pelvic examination, patient request treatment for yeast infection as she usually gets that after flagyl  - Educated and discussed changes in habits and alternate therapies that are safe in pregnancy  - metroNIDAZOLE (FLAGYL) 500 MG tablet; Take 1 tablet (500 mg total) by mouth 2 (two) times daily.  Dispense: 14 tablet; Refill: 0 - terconazole (TERAZOL 7) 0.4 % vaginal cream; Place 1 applicator vaginally at bedtime. Use for seven days  Dispense: 45 g; Refill: 0  3. Marijuana user - Patient reports currently use of marijuana - reports it helps appetite, educated and discussed complications of marijuana and pregnancy  - Encouraged patient to ween self off of smoking like she has in past  - Patient denies use of any other drugs or alcohol  - Urine drugs of  abuse scrn w alc, routine ([facility]) - [place] MFM OB COMP + 14 WK; Future  4. Herpes simplex type 2 (HSV-2) infection affecting pregnancy, antepartum - needs suppression later in pregnancy   5. Flu vaccine need - Flu Vaccine QUAD 36+ mos IM (Fluarix, Quad PF)    Initial labs drawn. Continue prenatal vitamins. Genetic Screening discussed, AFP and NIPS: declined. Ultrasound discussed; fetal anatomic survey: ordered. Problem list reviewed and updated. The nature of [place] with multiple MDs and other Advanced Practice Providers was explained to  patient; also emphasized that residents, students are part of our team. Routine obstetric precautions reviewed. Return in about 4 weeks (around 06/13/2019) for ROB-virtual.     Lajean Manes, [place] for [employer], [place]

## 2019-05-16 NOTE — Addendum Note (Signed)
Addended byFrutoso Chase on: 05/16/2019 02:51 PM   Modules accepted: Orders

## 2019-05-16 NOTE — Patient Instructions (Signed)
Marijuana Use During Pregnancy and Breastfeeding  Marijuana is the dried leaves, flowers, and stems of the Cannabis sativa or Cannabis indica plant. The plant's active ingredients (cannabinoids), including a chemical called THC, change the chemistry of the brain. Marijuana smoke also has many of the same chemicals as cigarette smoke that cause breathing problems. Marijuana gets into your blood through your lungs when you smoke it and through your digestive system when you swallow it. Using marijuana in any form may be harmful for you and your baby when you are trying to become pregnant and during pregnancy. This includes marijuana that is prescribed to you by a health care provider (medical marijuana). Once marijuana is in your blood, it can travel through your placenta to your baby. It may also pass through breast milk. How does this affect me? Marijuana affects you both mentally and physically. Using marijuana can make you feel high and relaxed. It can also have negative effects, especially at high doses or with long-term use. These include:  Rapid heartbeat and stress on your heart.  Lung irritation and breathing problems.  Difficulty thinking and making decisions.  Seeing or believing things that are not true (hallucinations and paranoia).  Mood swings, depression, or anxiety.  Decreased ability to learn and remember.  Difficulty getting pregnant. Marijuana can also affect your pregnancy. Not all the effects are known. However, if you use marijuana during pregnancy, you may:  Be less likely to get regular prenatal care and do the things that you need to do to have a healthy pregnancy.  Be more likely to use other drugs that can harm your pregnancy, like drinking alcohol and smoking cigarettes.  Be at higher risk of having your baby die after 28 weeks of pregnancy (stillbirth).  Be at higher risk of giving birth before 37 weeks of pregnancy (premature birth). How does this affect my  baby? If you use marijuana during pregnancy, this may affect your baby's development, birth, and life after birth. Your baby may:  Be born prematurely, which can cause physical and mental problems.  Be born with a low birth weight, which can lead to physical and mental problems.  Have problems with brain development.  Have difficulty growing.  Have attention and behavior problems later in life.  Do poorly at school and have learning problems later in life.  Have problems with vision and coordination.  Be at higher risk for using marijuana by age 38. More research is needed to find out exactly how marijuana affects a baby during breastfeeding. Some studies suggest that the chemicals in marijuana can be passed to a baby through breast milk. To limit possible risks, you should not use marijuana during breastfeeding. Follow these instructions at home:  Let your health care provider know if you use marijuana before trying to get pregnant, during pregnancy, or during breastfeeding.  Do not use marijuana in any form when you are trying to get pregnant, when you are pregnant, or when you are breastfeeding. If you are having trouble stopping marijuana use, ask your health care provider for help.  Do not smoke. If you need help quitting, ask your health care provider for help.  If you are using medical marijuana, ask your health care provider to switch you to a medicine that is safer to use during pregnancy or breastfeeding.  Keep all your prenatal visits as told by your health care provider. This is important. Where to find more information [employer] on Drug Abuse: www.drugabuse.gov March of Dimes:  www.marchofdimes.org/pregnancy Contact a health care provider if:  You use marijuana and want to get pregnant.  You use marijuana during pregnancy or breastfeeding.  You need help stopping marijuana use. Get help right away if:  Your baby is not gaining weight or growing as  expected. Summary  Using marijuana in any form may be harmful for you and your baby when you are trying to become pregnant, during pregnancy, and during breastfeeding. This includes marijuana that is prescribed to you (medical marijuana).  Some studies suggest that marijuana may pass through breast milk and can affect your baby's brain development.  Talk to your health care provider if you use marijuana in any form while trying to get pregnant, during pregnancy, or while breastfeeding.  Ask your health care provider for help if you are not able to stop using marijuana. This information is not intended to replace advice given to you by your health care provider. Make sure you discuss any questions you have with your health care provider. Document Revised: 07/23/2018 Document Reviewed: 12/17/2016 Elsevier Patient Education  2020 Elsevier Inc.  

## 2019-05-17 LAB — OBSTETRIC PANEL, INCLUDING HIV
Antibody Screen: NEGATIVE
Basophils Absolute: 0 10*3/uL (ref 0.0–0.2)
Basos: 0 %
EOS (ABSOLUTE): 0 10*3/uL (ref 0.0–0.4)
Eos: 0 %
HIV Screen 4th Generation wRfx: NONREACTIVE
Hematocrit: 36.6 % (ref 34.0–46.6)
Hemoglobin: 12.4 g/dL (ref 11.1–15.9)
Hepatitis B Surface Ag: NEGATIVE
Immature Grans (Abs): 0 10*3/uL (ref 0.0–0.1)
Immature Granulocytes: 0 %
Lymphocytes Absolute: 1.7 10*3/uL (ref 0.7–3.1)
Lymphs: 22 %
MCH: 28.6 pg (ref 26.6–33.0)
MCHC: 33.9 g/dL (ref 31.5–35.7)
MCV: 85 fL (ref 79–97)
Monocytes Absolute: 0.4 10*3/uL (ref 0.1–0.9)
Monocytes: 5 %
Neutrophils Absolute: 5.5 10*3/uL (ref 1.4–7.0)
Neutrophils: 73 %
Platelets: 233 10*3/uL (ref 150–450)
RBC: 4.33 x10E6/uL (ref 3.77–5.28)
RDW: 14.5 % (ref 11.7–15.4)
RPR Ser Ql: NONREACTIVE
Rh Factor: POSITIVE
Rubella Antibodies, IGG: 4 index (ref >0.99)
WBC: 7.6 10*3/uL (ref 3.4–10.8)

## 2019-05-18 LAB — CYTOLOGY - PAP
Chlamydia: NEGATIVE
Comment: NEGATIVE
Comment: NEGATIVE
Comment: NORMAL
Diagnosis: NEGATIVE
Neisseria Gonorrhea: NEGATIVE
Trichomonas: NEGATIVE

## 2019-05-19 LAB — CULTURE, OB URINE

## 2019-05-19 LAB — URINE CULTURE, OB REFLEX

## 2019-05-20 LAB — PANEL 799049
CARBOXY THC GC/MS CONF: >300 ng/mL
Cannabinoid GC/MS, Ur: POSITIVE — AB

## 2019-05-20 LAB — URINE DRUGS OF ABUSE SCREEN W ALC, ROUTINE (REF LAB)
Amphetamines, Urine: NEGATIVE ng/mL
Barbiturate Quant, Ur: NEGATIVE ng/mL
Benzodiazepine Quant, Ur: NEGATIVE ng/mL
Cocaine (Metab.): NEGATIVE ng/mL
Ethanol, Urine: NEGATIVE %
Methadone Screen, Urine: NEGATIVE ng/mL
Opiate Quant, Ur: NEGATIVE ng/mL
PCP Quant, Ur: NEGATIVE ng/mL
Propoxyphene: NEGATIVE ng/mL

## 2019-06-13 ENCOUNTER — Telehealth (INDEPENDENT_AMBULATORY_CARE_PROVIDER_SITE_OTHER): Payer: BC Managed Care – PPO | Admitting: Women's Health

## 2019-06-13 DIAGNOSIS — R030 Elevated blood-pressure reading, without diagnosis of hypertension: Secondary | ICD-10-CM

## 2019-06-13 DIAGNOSIS — Z3482 Encounter for supervision of other normal pregnancy, second trimester: Secondary | ICD-10-CM

## 2019-06-13 DIAGNOSIS — N760 Acute vaginitis: Secondary | ICD-10-CM

## 2019-06-13 DIAGNOSIS — Z3A15 15 weeks gestation of pregnancy: Secondary | ICD-10-CM

## 2019-06-13 DIAGNOSIS — F1910 Other psychoactive substance abuse, uncomplicated: Secondary | ICD-10-CM

## 2019-06-13 DIAGNOSIS — O99323 Drug use complicating pregnancy, third trimester: Secondary | ICD-10-CM

## 2019-06-13 DIAGNOSIS — B9689 Other specified bacterial agents as the cause of diseases classified elsewhere: Secondary | ICD-10-CM

## 2019-06-13 DIAGNOSIS — O98519 Other viral diseases complicating pregnancy, unspecified trimester: Secondary | ICD-10-CM

## 2019-06-13 DIAGNOSIS — B009 Herpesviral infection, unspecified: Secondary | ICD-10-CM

## 2019-06-13 NOTE — Progress Notes (Signed)
I connected with Anita Sims. [place] on 06/13/19 at  3:40 PM EST by: phone and verified that I am speaking with the correct person using two identifiers.  Patient is located at home and provider is located at [facility].     The purpose of this virtual visit is to provide medical care while limiting exposure to the novel coronavirus. I discussed the limitations, risks, security and privacy concerns of performing an evaluation and management service by phone and the availability of in person appointments. I also discussed with the patient that there may be a patient responsible charge related to this service. By engaging in this virtual visit, you consent to the provision of healthcare.  Additionally, you authorize for your insurance to be billed for the services provided during this visit.  The patient expressed understanding and agreed to proceed.  The following staff members participated in the virtual visit:  Donia Ast    PRENATAL VISIT NOTE  Subjective:  Anita Sims is a 24 y.o. [id number] at [redacted]w[redacted]d  for phone visit for ongoing prenatal care.  She is currently monitored for the following issues for this low-risk pregnancy and has Polysubstance abuse (HCC); Elevated BP without diagnosis of hypertension; Miscarriage within last 12 months; Bacterial vaginitis; Supervision of normal pregnancy; Marijuana user; and Herpes simplex type 2 (HSV-2) infection affecting pregnancy, antepartum on their problem list.  Patient reports no complaints.  Contractions: Not present. Vag. Bleeding: None.  Movement: Absent. Denies leaking of fluid.   The following portions of the patient's history were reviewed and updated as appropriate: allergies, current medications, past family history, past medical history, past social history, past surgical history and problem list.   Objective:  There were no vitals filed for this visit. Pt reports she has a BP cuff, but it is still in the box and she has not yet used  it. Pt advised to take BP later tonight or tomorrow AM and call clinic with results.  Fetal Status:     Movement: Absent     Assessment and Plan:  Pregnancy: [id number] at [redacted]w[redacted]d  1. Encounter for supervision of other normal pregnancy in second trimester -anatomy scan scheduled 06/30/2019, pt aware -discussed contraception, pt elects Depo, information given -AFP future order entered for after ultrasound on 06/30/2019 -discussed normal discharge in pregnancy  2. Polysubstance abuse (HCC) - Patient reports current use of marijuana, but has decreased use since last visit - pt has been using it to control her emotions, specifically anxiety as patient is feeling like she is "balancing" many competing requirements such as two young children, school and work. - advised behavioral health consultation, but patient declines stating she is working to cut out marijuana on her own, pt advised the referral has been palced and she can accept the referral at any time. - educated and discussed complications of marijuana and pregnancy  - Encouraged patient to ween self off of smoking like she has in past  - Patient denies use of any other drugs or alcohol  - UDS positive for THC only at NOB  3. Elevated BP without diagnosis of hypertension -Pt reports she has a BP cuff, but it is still in the box and she has not yet used it. Pt advised to take BP later tonight or tomorrow AM and call clinic with results.  4. Herpes simplex type 2 (HSV-2) infection affecting pregnancy, antepartum -suppression at 35/36weeks  Preterm labor symptoms and general obstetric precautions including but not limited to vaginal bleeding, contractions,  leaking of fluid and fetal movement were reviewed in detail with the patient. I discussed the assessment and treatment plan with the patient. The patient was provided an opportunity to ask questions and all were answered. The patient agreed with the plan and demonstrated an understanding of  the instructions. The patient was advised to call back or seek an in-person office evaluation/go to MAU at [facility] for any urgent or concerning symptoms.  Return in about 4 weeks (around 07/11/2019) for virtual ROB, nurse visit for BV/yeast swab, no symptoms.  Future Appointments  Date Time Provider [place]  06/30/2019  1:15 PM [place] 4 [facility]-M[facility]US M[facility]-US     Time spent on virtual visit: 11 minutes  Clarisa Fling, NP

## 2019-06-13 NOTE — Patient Instructions (Addendum)
Marijuana Use During Pregnancy and Breastfeeding  Marijuana is the dried leaves, flowers, and stems of the Cannabis sativa or Cannabis indica plant. The plant's active ingredients (cannabinoids), including a chemical called THC, change the chemistry of the brain. Marijuana smoke also has many of the same chemicals as cigarette smoke that cause breathing problems. Marijuana gets into your blood through your lungs when you smoke it and through your digestive system when you swallow it. Using marijuana in any form may be harmful for you and your baby when you are trying to become pregnant and during pregnancy. This includes marijuana that is prescribed to you by a health care provider (medical marijuana). Once marijuana is in your blood, it can travel through your placenta to your baby. It may also pass through breast milk. How does this affect me? Marijuana affects you both mentally and physically. Using marijuana can make you feel high and relaxed. It can also have negative effects, especially at high doses or with long-term use. These include:  Rapid heartbeat and stress on your heart.  Lung irritation and breathing problems.  Difficulty thinking and making decisions.  Seeing or believing things that are not true (hallucinations and paranoia).  Mood swings, depression, or anxiety.  Decreased ability to learn and remember.  Difficulty getting pregnant. Marijuana can also affect your pregnancy. Not all the effects are known. However, if you use marijuana during pregnancy, you may:  Be less likely to get regular prenatal care and do the things that you need to do to have a healthy pregnancy.  Be more likely to use other drugs that can harm your pregnancy, like drinking alcohol and smoking cigarettes.  Be at higher risk of having your baby die after 28 weeks of pregnancy (stillbirth).  Be at higher risk of giving birth before 37 weeks of pregnancy (premature birth). How does this affect my  baby? If you use marijuana during pregnancy, this may affect your baby's development, birth, and life after birth. Your baby may:  Be born prematurely, which can cause physical and mental problems.  Be born with a low birth weight, which can lead to physical and mental problems.  Have problems with brain development.  Have difficulty growing.  Have attention and behavior problems later in life.  Do poorly at school and have learning problems later in life.  Have problems with vision and coordination.  Be at higher risk for using marijuana by age 38. More research is needed to find out exactly how marijuana affects a baby during breastfeeding. Some studies suggest that the chemicals in marijuana can be passed to a baby through breast milk. To limit possible risks, you should not use marijuana during breastfeeding. Follow these instructions at home:  Let your health care provider know if you use marijuana before trying to get pregnant, during pregnancy, or during breastfeeding.  Do not use marijuana in any form when you are trying to get pregnant, when you are pregnant, or when you are breastfeeding. If you are having trouble stopping marijuana use, ask your health care provider for help.  Do not smoke. If you need help quitting, ask your health care provider for help.  If you are using medical marijuana, ask your health care provider to switch you to a medicine that is safer to use during pregnancy or breastfeeding.  Keep all your prenatal visits as told by your health care provider. This is important. Where to find more information [employer] on Drug Abuse: www.drugabuse.gov March of Dimes:  www.marchofdimes.org/pregnancy Contact a health care provider if:  You use marijuana and want to get pregnant.  You use marijuana during pregnancy or breastfeeding.  You need help stopping marijuana use. Get help right away if:  Your baby is not gaining weight or growing as  expected. Summary  Using marijuana in any form may be harmful for you and your baby when you are trying to become pregnant, during pregnancy, and during breastfeeding. This includes marijuana that is prescribed to you (medical marijuana).  Some studies suggest that marijuana may pass through breast milk and can affect your baby's brain development.  Talk to your health care provider if you use marijuana in any form while trying to get pregnant, during pregnancy, or while breastfeeding.  Ask your health care provider for help if you are not able to stop using marijuana. This information is not intended to replace advice given to you by your health care provider. Make sure you discuss any questions you have with your health care provider. Document Revised: 07/23/2018 Document Reviewed: 12/17/2016 Elsevier Patient Education  2020 [employer].   Contraception Choices Contraception, also called birth control, refers to methods or devices that prevent pregnancy. Hormonal methods Contraceptive implant  A contraceptive implant is a thin, plastic tube that contains a hormone. It is inserted into the upper part of the arm. It can remain in place for up to 3 years. Progestin-only injections Progestin-only injections are injections of progestin, a synthetic form of the hormone progesterone. They are given every 3 months by a health care provider. Birth control pills  Birth control pills are pills that contain hormones that prevent pregnancy. They must be taken once a day, preferably at the same time each day. Birth control patch  The birth control patch contains hormones that prevent pregnancy. It is placed on the skin and must be changed once a week for three weeks and removed on the fourth week. A prescription is needed to use this method of contraception. Vaginal ring  A vaginal ring contains hormones that prevent pregnancy. It is placed in the vagina for three weeks and removed on the fourth  week. After that, the process is repeated with a new ring. A prescription is needed to use this method of contraception. Emergency contraceptive Emergency contraceptives prevent pregnancy after unprotected sex. They come in pill form and can be taken up to 5 days after sex. They work best the sooner they are taken after having sex. Most emergency contraceptives are available without a prescription. This method should not be used as your only form of birth control. Barrier methods Female condom  A female condom is a thin sheath that is worn over the penis during sex. Condoms keep sperm from going inside a woman's body. They can be used with a spermicide to increase their effectiveness. They should be disposed after a single use. Female condom  A female condom is a soft, loose-fitting sheath that is put into the vagina before sex. The condom keeps sperm from going inside a woman's body. They should be disposed after a single use. Diaphragm  A diaphragm is a soft, dome-shaped barrier. It is inserted into the vagina before sex, along with a spermicide. The diaphragm blocks sperm from entering the uterus, and the spermicide kills sperm. A diaphragm should be left in the vagina for 6-8 hours after sex and removed within 24 hours. A diaphragm is prescribed and fitted by a health care provider. A diaphragm should be replaced every 1-2 years, after  giving birth, after gaining more than 15 lb (6.8 kg), and after pelvic surgery. Cervical cap  A cervical cap is a round, soft latex or plastic cup that fits over the cervix. It is inserted into the vagina before sex, along with spermicide. It blocks sperm from entering the uterus. The cap should be left in place for 6-8 hours after sex and removed within 48 hours. A cervical cap must be prescribed and fitted by a health care provider. It should be replaced every 2 years. Sponge  A sponge is a soft, circular piece of polyurethane foam with spermicide on it. The  sponge helps block sperm from entering the uterus, and the spermicide kills sperm. To use it, you make it wet and then insert it into the vagina. It should be inserted before sex, left in for at least 6 hours after sex, and removed and thrown away within 30 hours. Spermicides Spermicides are chemicals that kill or block sperm from entering the cervix and uterus. They can come as a cream, jelly, suppository, foam, or tablet. A spermicide should be inserted into the vagina with an applicator at least 10-15 minutes before sex to allow time for it to work. The process must be repeated every time you have sex. Spermicides do not require a prescription. Intrauterine contraception Intrauterine device (IUD) An IUD is a T-shaped device that is put in a woman's uterus. There are two types:  Hormone IUD.This type contains progestin, a synthetic form of the hormone progesterone. This type can stay in place for 3-5 years.  Copper IUD.This type is wrapped in copper wire. It can stay in place for 10 years.  Permanent methods of contraception Female tubal ligation In this method, a woman's fallopian tubes are sealed, tied, or blocked during surgery to prevent eggs from traveling to the uterus. Hysteroscopic sterilization In this method, a small, flexible insert is placed into each fallopian tube. The inserts cause scar tissue to form in the fallopian tubes and block them, so sperm cannot reach an egg. The procedure takes about 3 months to be effective. Another form of birth control must be used during those 3 months. Female sterilization This is a procedure to tie off the tubes that carry sperm (vasectomy). After the procedure, the man can still ejaculate fluid (semen). Natural planning methods Natural family planning In this method, a couple does not have sex on days when the woman could become pregnant. Calendar method This means keeping track of the length of each menstrual cycle, identifying the days when  pregnancy can happen, and not having sex on those days. Ovulation method In this method, a couple avoids sex during ovulation. Symptothermal method This method involves not having sex during ovulation. The woman typically checks for ovulation by watching changes in her temperature and in the consistency of cervical mucus. Post-ovulation method In this method, a couple waits to have sex until after ovulation. Summary  Contraception, also called birth control, means methods or devices that prevent pregnancy.  Hormonal methods of contraception include implants, injections, pills, patches, vaginal rings, and emergency contraceptives.  Barrier methods of contraception can include female condoms, female condoms, diaphragms, cervical caps, sponges, and spermicides.  There are two types of IUDs (intrauterine devices). An IUD can be put in a woman's uterus to prevent pregnancy for 3-5 years.  Permanent sterilization can be done through a procedure for males, females, or both.  Natural family planning methods involve not having sex on days when the woman could become pregnant.  This information is not intended to replace advice given to you by your health care provider. Make sure you discuss any questions you have with your health care provider. Document Revised: 04/02/2017 Document Reviewed: 05/03/2016 Elsevier Patient Education  2020 [place] Assessment Unit (MAU)  The Maternity Assessment Unit (MAU) is located at the [facility] and [place] at [facility]. The address is: [address], [place], [place], [place] [address]. Please see map below for additional directions.    The Maternity Assessment Unit is designed to help you during your pregnancy, and for up to 6 weeks after delivery, with any pregnancy- or postpartum-related emergencies, if you think you are in labor, or if your water has broken. For example, if you experience nausea and vomiting, vaginal  bleeding, severe abdominal or pelvic pain, elevated blood pressure or other problems related to your pregnancy or postpartum time, please come to the Maternity Assessment Unit for assistance.   Medroxyprogesterone injection [Contraceptive] What is this medicine? MEDROXYPROGESTERONE (me DROX ee proe JES te rone) contraceptive injections prevent pregnancy. They provide effective birth control for 3 months. Depo-subQ Provera 104 is also used for treating pain related to endometriosis. This medicine may be used for other purposes; ask your health care provider or pharmacist if you have questions. COMMON BRAND NAME(S): Depo-Provera, Depo-subQ Provera 104 What should I tell my health care provider before I take this medicine? They need to know if you have any of these conditions:  frequently drink alcohol  asthma  blood vessel disease or a history of a blood clot in the lungs or legs  bone disease such as osteoporosis  breast cancer  diabetes  eating disorder (anorexia nervosa or bulimia)  high blood pressure  HIV infection or AIDS  kidney disease  liver disease  mental depression  migraine  seizures (convulsions)  stroke  tobacco smoker  vaginal bleeding  an unusual or allergic reaction to medroxyprogesterone, other hormones, medicines, foods, dyes, or preservatives  pregnant or trying to get pregnant  breast-feeding How should I use this medicine? Depo-Provera Contraceptive injection is given into a muscle. Depo-subQ Provera 104 injection is given under the skin. These injections are given by a health care professional. You must not be pregnant before getting an injection. The injection is usually given during the first 5 days after the start of a menstrual period or 6 weeks after delivery of a baby. Talk to your pediatrician regarding the use of this medicine in children. Special care may be needed. These injections have been used in female children who have started  having menstrual periods. Overdosage: If you think you have taken too much of this medicine contact a poison control center or emergency room at once. NOTE: This medicine is only for you. Do not share this medicine with others. What if I miss a dose? Try not to miss a dose. You must get an injection once every 3 months to maintain birth control. If you cannot keep an appointment, call and reschedule it. If you wait longer than 13 weeks between Depo-Provera contraceptive injections or longer than 14 weeks between Depo-subQ Provera 104 injections, you could get pregnant. Use another method for birth control if you miss your appointment. You may also need a pregnancy test before receiving another injection. What may interact with this medicine? Do not take this medicine with any of the following medications:  bosentan This medicine may also interact with the following medications:  aminoglutethimide  antibiotics or medicines  for infections, especially rifampin, rifabutin, rifapentine, and griseofulvin  aprepitant  barbiturate medicines such as phenobarbital or primidone  bexarotene  carbamazepine  medicines for seizures like ethotoin, felbamate, oxcarbazepine, phenytoin, topiramate  modafinil  St. John's wort This list may not describe all possible interactions. Give your health care provider a list of all the medicines, herbs, non-prescription drugs, or dietary supplements you use. Also tell them if you smoke, drink alcohol, or use illegal drugs. Some items may interact with your medicine. What should I watch for while using this medicine? This drug does not protect you against HIV infection (AIDS) or other sexually transmitted diseases. Use of this product may cause you to lose calcium from your bones. Loss of calcium may cause weak bones (osteoporosis). Only use this product for more than 2 years if other forms of birth control are not right for you. The longer you use this product for  birth control the more likely you will be at risk for weak bones. Ask your health care professional how you can keep strong bones. You may have a change in bleeding pattern or irregular periods. Many females stop having periods while taking this drug. If you have received your injections on time, your chance of being pregnant is very low. If you think you may be pregnant, see your health care professional as soon as possible. Tell your health care professional if you want to get pregnant within the next year. The effect of this medicine may last a long time after you get your last injection. What side effects may I notice from receiving this medicine? Side effects that you should report to your doctor or health care professional as soon as possible:  allergic reactions like skin rash, itching or hives, swelling of the face, lips, or tongue  breast tenderness or discharge  breathing problems  changes in vision  depression  feeling faint or lightheaded, falls  fever  pain in the abdomen, chest, groin, or leg  problems with balance, talking, walking  unusually weak or tired  yellowing of the eyes or skin Side effects that usually do not require medical attention (report to your doctor or health care professional if they continue or are bothersome):  acne  fluid retention and swelling  headache  irregular periods, spotting, or absent periods  temporary pain, itching, or skin reaction at site where injected  weight gain This list may not describe all possible side effects. Call your doctor for medical advice about side effects. You may report side effects to FDA at 1-800-FDA-1088. Where should I keep my medicine? This does not apply. The injection will be given to you by a health care professional. NOTE: This sheet is a summary. It may not cover all possible information. If you have questions about this medicine, talk to your doctor, pharmacist, or health care provider.  2020  Elsevier/Gold Standard (2008-04-21 18:37:56)

## 2019-06-15 ENCOUNTER — Encounter: Payer: Self-pay | Admitting: Certified Nurse Midwife

## 2019-06-20 ENCOUNTER — Institutional Professional Consult (permissible substitution): Payer: BC Managed Care – PPO | Admitting: Licensed Clinical Social Worker

## 2019-06-22 ENCOUNTER — Ambulatory Visit: Payer: BC Managed Care – PPO

## 2019-06-28 ENCOUNTER — Ambulatory Visit (INDEPENDENT_AMBULATORY_CARE_PROVIDER_SITE_OTHER): Payer: BC Managed Care – PPO | Admitting: Licensed Clinical Social Worker

## 2019-06-28 DIAGNOSIS — O99322 Drug use complicating pregnancy, second trimester: Secondary | ICD-10-CM | POA: Diagnosis not present

## 2019-06-28 DIAGNOSIS — Z3A16 16 weeks gestation of pregnancy: Secondary | ICD-10-CM | POA: Diagnosis not present

## 2019-06-28 DIAGNOSIS — Z658 Other specified problems related to psychosocial circumstances: Secondary | ICD-10-CM

## 2019-06-28 DIAGNOSIS — F1290 Cannabis use, unspecified, uncomplicated: Secondary | ICD-10-CM | POA: Diagnosis not present

## 2019-06-29 NOTE — BH Specialist Note (Signed)
Integrated Behavioral Health Initial Visit  MRN: [id number] Name: Anita Sims  Number of Integrated Behavioral Health Clinician visits:: 1 Session Start time: 3:30pm  Session End time: 3:53pm Total time: 23 mins   Type of Service: Integrated Behavioral Health- Individual Interpretor:no  Interpretor Name and Language: none    Warm Hand Off Completed.       SUBJECTIVE: Anita Sims is a 24 y.o. female  Patient was referred by N. Nugent NP  for polysubstance abuse. Patient reports the following symptoms/concerns: Unable to sleep and work schedule  Duration of problem: Current pregnancy ; Severity of problem: mild  OBJECTIVE: Mood: good  and Affect: normal Risk of harm to self or others: no risk of harm to self or others.   LIFE CONTEXT: Family and Social:  School/Work: Works from home  Self-Care: n/a Life Changes: Currently pregnant  GOALS ADDRESSED: Patient will: 1. Reduce symptoms of: poor appetite  2. Increase knowledge and/or ability of:   3. Demonstrate ability to: manage symptoms without engaging in marijuana use   INTERVENTIONS: Interventions utilized:   Standardized Assessments completed: PHQ9 score 9   ASSESSMENT: Patient currently experiencing psychosocial stressors. Anita Sims reports she wants a reduced schedule from work because she does not have time to eat. Anita Sims informed Anita Sims to discuss her need for reduced schedule during her next Anita Sims with Anita Sims. Anita Sims reports she engages in marijuana use because it helps her sleep. Anita Sims encourage discontinuing marijuana use and inform her of hospital policy of reporting positive drug screen to county dss.    Patient may benefit from psychoeducation   PLAN: 1. Follow up with behavioral health clinician on : as needed  2. Behavioral recommendations: continue prenatal vitamins, discontinue marijuana use, prioritize sleep hygiene   3. Referral(s): none  4. "From scale of 1-10, how likely are you to follow plan?":   Anita Saxon, LCSW

## 2019-06-30 ENCOUNTER — Other Ambulatory Visit: Payer: Self-pay

## 2019-06-30 ENCOUNTER — Ambulatory Visit (HOSPITAL_COMMUNITY)
Admission: RE | Admit: 2019-06-30 | Discharge: 2019-06-30 | Disposition: A | Discharge status: Home / Self Care | Payer: BC Managed Care – PPO | Source: Ambulatory Visit | Attending: Certified Nurse Midwife | Admitting: Certified Nurse Midwife

## 2019-06-30 DIAGNOSIS — O09892 Supervision of other high risk pregnancies, second trimester: Secondary | ICD-10-CM

## 2019-06-30 DIAGNOSIS — O99322 Drug use complicating pregnancy, second trimester: Secondary | ICD-10-CM

## 2019-06-30 DIAGNOSIS — Z363 Encounter for antenatal screening for malformations: Secondary | ICD-10-CM | POA: Diagnosis not present

## 2019-06-30 DIAGNOSIS — Z3A18 18 weeks gestation of pregnancy: Secondary | ICD-10-CM

## 2019-06-30 DIAGNOSIS — F1290 Cannabis use, unspecified, uncomplicated: Secondary | ICD-10-CM | POA: Diagnosis present

## 2019-06-30 DIAGNOSIS — Z3687 Encounter for antenatal screening for uncertain dates: Secondary | ICD-10-CM | POA: Diagnosis not present

## 2019-06-30 DIAGNOSIS — Z3490 Encounter for supervision of normal pregnancy, unspecified, unspecified trimester: Secondary | ICD-10-CM | POA: Insufficient documentation

## 2019-07-12 ENCOUNTER — Other Ambulatory Visit: Payer: Self-pay

## 2019-07-12 ENCOUNTER — Ambulatory Visit (INDEPENDENT_AMBULATORY_CARE_PROVIDER_SITE_OTHER): Payer: BC Managed Care – PPO | Admitting: Advanced Practice Midwife

## 2019-07-12 VITALS — BP 104/69 | HR 80 | Wt 168.0 lb

## 2019-07-12 DIAGNOSIS — O26812 Pregnancy related exhaustion and fatigue, second trimester: Secondary | ICD-10-CM

## 2019-07-12 DIAGNOSIS — N760 Acute vaginitis: Secondary | ICD-10-CM

## 2019-07-12 DIAGNOSIS — B9689 Other specified bacterial agents as the cause of diseases classified elsewhere: Secondary | ICD-10-CM

## 2019-07-12 DIAGNOSIS — Z3A19 19 weeks gestation of pregnancy: Secondary | ICD-10-CM

## 2019-07-12 DIAGNOSIS — O23592 Infection of other part of genital tract in pregnancy, second trimester: Secondary | ICD-10-CM

## 2019-07-12 DIAGNOSIS — Z658 Other specified problems related to psychosocial circumstances: Secondary | ICD-10-CM

## 2019-07-12 DIAGNOSIS — Z3482 Encounter for supervision of other normal pregnancy, second trimester: Secondary | ICD-10-CM

## 2019-07-12 MED ORDER — METRONIDAZOLE 500 MG PO TABS: 500.0000 mg | ORAL_TABLET | Freq: Two times a day (BID) | ORAL | 1 refills | Status: AC

## 2019-07-12 NOTE — Progress Notes (Signed)
   PRENATAL VISIT NOTE  Subjective:  Anita Sims is a 24 y.o. [phone] at [redacted]w[redacted]d being seen today for ongoing prenatal care.  She is currently monitored for the following issues for this low-risk pregnancy and has Polysubstance abuse (HCC); Elevated BP without diagnosis of hypertension; Miscarriage within last 12 months; Bacterial vaginitis; Supervision of normal pregnancy; Marijuana user; and Herpes simplex type 2 (HSV-2) infection affecting pregnancy, antepartum on their problem list.  Patient reports not sleeping well with other small children so fatigued at work, working 2 jobs, 40+ hours/week.  Desires to limit hours due to fatigue..  Contractions: Not present. Vag. Bleeding: None.  Movement: Present. Denies leaking of fluid.   The following portions of the patient's history were reviewed and updated as appropriate: allergies, current medications, past family history, past medical history, past social history, past surgical history and problem list.   Objective:   Vitals:   07/12/19 1550  BP: 104/69  Pulse: 80  Weight: 168 lb (76.2 kg)    Fetal Status: Fetal Heart Rate (bpm): 156   Movement: Present     General:  Alert, oriented and cooperative. Patient is in no acute distress.  Skin: Skin is warm and dry. No rash noted.   Cardiovascular: Normal heart rate noted  Respiratory: Normal respiratory effort, no problems with respiration noted  Abdomen: Soft, gravid, appropriate for gestational age.  Pain/Pressure: Absent     Pelvic: Cervical exam deferred        Extremities: Normal range of motion.  Edema: None  Mental Status: Normal mood and affect. Normal behavior. Normal judgment and thought content.   Assessment and Plan:  Pregnancy: [id number] at [redacted]w[redacted]d 1. Bacterial vaginosis --recurrent, prior to pregnancy and during pregnancy --Symptoms today c/w previous BV, no risk for STDs --Printed and verbal education about prevention of vaginal infections given, including daily  probiotics - metroNIDAZOLE (FLAGYL) 500 MG tablet; Take 1 tablet (500 mg total) by mouth 2 (two) times daily for 7 days.  Dispense: 14 tablet; Refill: 1  2. Psychosocial stressors --Pt not sleeping well, has 2 other small children, and reports being upset about another boy with this pregnancy.  Does not currently have counseling but has had outpatient counseling in the past.  - Ambulatory referral to Integrated Behavioral Health  3. Encounter for supervision of other normal pregnancy in second trimester --Anticipatory guidance about next visits/weeks of pregnancy given. --Next visit in 4 weeks virtual  4. Pregnancy related fatigue in second trimester --Letter provided to limit her daytime job to 30 hours or less weekly.    Preterm labor symptoms and general obstetric precautions including but not limited to vaginal bleeding, contractions, leaking of fluid and fetal movement were reviewed in detail with the patient. Please refer to After Visit Summary for other counseling recommendations.   No follow-ups on file.  Future Appointments  Date Time Provider Department Center  08/09/2019  3:45 PM Brock Bad, MD [facility] None    Sharen Counter, CNM

## 2019-07-12 NOTE — Patient Instructions (Signed)

## 2019-07-12 NOTE — Progress Notes (Signed)
CC: possible BV or yeast infection.  Pt prefers Treatment Rx .

## 2019-07-18 ENCOUNTER — Ambulatory Visit (INDEPENDENT_AMBULATORY_CARE_PROVIDER_SITE_OTHER): Payer: BC Managed Care – PPO | Admitting: Licensed Clinical Social Worker

## 2019-07-18 DIAGNOSIS — Z658 Other specified problems related to psychosocial circumstances: Secondary | ICD-10-CM | POA: Diagnosis not present

## 2019-07-18 NOTE — BH Specialist Note (Signed)
Integrated Behavioral Health Follow Up Visit  MRN: [id number] Name: Anita Sims  Number of Integrated Behavioral Health Clinician visits: 2 Session Start time: 2:20  Session End time: 2:35pm Total time:   Type of Service: Integrated Behavioral Health- Individual Interpretor:no  Interpretor Name and Language: none  SUBJECTIVE: Anita Sims is a 24 y.o. 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  Reduce symptoms of:  2.  Increase knowledge and/or ability of:   3.  Demonstrate ability to:   INTERVENTIONS: Interventions utilized:  Supportive counseling  Standardized Assessments completed: phq9 completed   ASSESSMENT: Patient currently experiencing psychoeducation   Patient may benefit from integrated behavioral health .  PLAN: 1. Follow up with behavioral health clinician on : as needed  2. Behavioral recommendations: discontinue marijuana use, continue taking prenatal vitamins, prioritize sleep and increase positive social interactions  3. Referral(s): none 4. "From scale of 1-10, how likely are you to follow plan?":  Gwyndolyn Saxon, LCSW

## 2019-08-09 ENCOUNTER — Telehealth (INDEPENDENT_AMBULATORY_CARE_PROVIDER_SITE_OTHER): Payer: BC Managed Care – PPO | Admitting: Obstetrics

## 2019-08-09 DIAGNOSIS — B009 Herpesviral infection, unspecified: Secondary | ICD-10-CM

## 2019-08-09 DIAGNOSIS — M549 Dorsalgia, unspecified: Secondary | ICD-10-CM

## 2019-08-09 DIAGNOSIS — Z302 Encounter for sterilization: Secondary | ICD-10-CM

## 2019-08-09 DIAGNOSIS — O98519 Other viral diseases complicating pregnancy, unspecified trimester: Secondary | ICD-10-CM

## 2019-08-09 DIAGNOSIS — F1910 Other psychoactive substance abuse, uncomplicated: Secondary | ICD-10-CM

## 2019-08-09 DIAGNOSIS — J301 Allergic rhinitis due to pollen: Secondary | ICD-10-CM

## 2019-08-09 DIAGNOSIS — Z658 Other specified problems related to psychosocial circumstances: Secondary | ICD-10-CM

## 2019-08-09 DIAGNOSIS — R030 Elevated blood-pressure reading, without diagnosis of hypertension: Secondary | ICD-10-CM

## 2019-08-09 DIAGNOSIS — Z3490 Encounter for supervision of normal pregnancy, unspecified, unspecified trimester: Secondary | ICD-10-CM

## 2019-08-09 NOTE — Progress Notes (Signed)
ROB   CC: none  Pt wants BTL

## 2019-08-10 ENCOUNTER — Encounter: Payer: Self-pay | Admitting: Obstetrics

## 2019-08-10 MED ORDER — VALACYCLOVIR HCL 500 MG PO TABS: 500.0000 mg | ORAL_TABLET | Freq: Every day | ORAL | 11 refills | Status: DC

## 2019-08-10 MED ORDER — LORATADINE 10 MG PO TABS: 10.0000 mg | ORAL_TABLET | Freq: Every day | ORAL | 11 refills | Status: DC

## 2019-08-10 MED ORDER — COMFORT FIT MATERNITY SUPP SM MISC: 0 refills | Status: DC

## 2019-08-10 MED ORDER — PRENATAL PLUS 27-1 MG PO TABS: 1.0000 | ORAL_TABLET | Freq: Every day | ORAL | 3 refills | Status: DC

## 2019-08-10 NOTE — Progress Notes (Signed)
   TELEHEALTH VIRTUAL OBSTETRICS VISIT ENCOUNTER NOTE  I connected with Anita Sims on 08/10/19 at  3:45 PM EDT by telephone at home and verified that I am speaking with the correct person using two identifiers.   I discussed the limitations, risks, security and privacy concerns of performing an evaluation and management service by telephone and the availability of in person appointments. I also discussed with the patient that there may be a patient responsible charge related to this service. The patient expressed understanding and agreed to proceed.  Subjective:  Anita Sims is a 24 y.o. [phone] at [redacted]w[redacted]d being followed for ongoing prenatal care.  She is currently monitored for the following issues for this low-risk pregnancy and has Polysubstance abuse (HCC); Elevated BP without diagnosis of hypertension; Miscarriage within last 12 months; Bacterial vaginitis; Supervision of normal pregnancy; Marijuana user; and Herpes simplex type 2 (HSV-2) infection affecting pregnancy, antepartum on their problem list.  Patient reports backache. Reports fetal movement. Denies any contractions, bleeding or leaking of fluid.   The following portions of the patient's history were reviewed and updated as appropriate: allergies, current medications, past family history, past medical history, past social history, past surgical history and problem list.   Objective:   General:  Alert, oriented and cooperative.   Mental Status: Normal mood and affect perceived. Normal judgment and thought content.  Rest of physical exam deferred due to type of encounter  Assessment and Plan:  Pregnancy: [id number] at [redacted]w[redacted]d 1. Encounter for supervision of normal pregnancy, antepartum, unspecified gravidity Rx: - [place] MFM OB FOLLOW UP; Future - prenatal vitamin w/FE, FA (PRENATAL 1 + 1) 27-1 MG TABS tablet; Take 1 tablet by mouth daily before breakfast.  Dispense: 90 tablet; Refill: 3  2. Backache symptom Rx -  Elastic Bandages & Supports (COMFORT FIT MATERNITY SUPP SM) MISC; Wear as directed.  Dispense: 1 each; Refill: 0  3. Elevated BP without diagnosis of hypertension - clinically stable  4. Seasonal allergic rhinitis due to pollen Rx: - loratadine (CLARITIN) 10 MG tablet; Take 1 tablet (10 mg total) by mouth daily.  Dispense: 30 tablet; Refill: 11  5. Polysubstance abuse (HCC) - clinically stable  6. Psychosocial stressors - clinically stable  7. Herpes simplex type 2 (HSV-2) infection affecting pregnancy, antepartum - Valtrex suppression starting in 3rd trimester  8. Request for sterilization   Preterm labor symptoms and general obstetric precautions including but not limited to vaginal bleeding, contractions, leaking of fluid and fetal movement were reviewed in detail with the patient.  I discussed the assessment and treatment plan with the patient. The patient was provided an opportunity to ask questions and all were answered. The patient agreed with the plan and demonstrated an understanding of the instructions. The patient was advised to call back or seek an in-person office evaluation/go to MAU at [facility] for any urgent or concerning symptoms. Please refer to After Visit Summary for other counseling recommendations.   I provided 15 minutes of non-face-to-face time during this encounter.  Return in about 4 weeks (around 09/06/2019) for ROB, 2 hour OGTT.   Coral Ceo, MD [facility], [facility] 08/10/2019 9:37 AM

## 2019-08-19 ENCOUNTER — Other Ambulatory Visit: Payer: Self-pay

## 2019-08-19 ENCOUNTER — Ambulatory Visit (HOSPITAL_COMMUNITY): Payer: BC Managed Care – PPO | Attending: Obstetrics and Gynecology

## 2019-08-19 DIAGNOSIS — Z3490 Encounter for supervision of normal pregnancy, unspecified, unspecified trimester: Secondary | ICD-10-CM | POA: Diagnosis not present

## 2019-08-19 DIAGNOSIS — Z3A25 25 weeks gestation of pregnancy: Secondary | ICD-10-CM | POA: Diagnosis not present

## 2019-08-19 DIAGNOSIS — Z362 Encounter for other antenatal screening follow-up: Secondary | ICD-10-CM

## 2019-08-19 DIAGNOSIS — O09892 Supervision of other high risk pregnancies, second trimester: Secondary | ICD-10-CM | POA: Diagnosis not present

## 2019-09-06 ENCOUNTER — Other Ambulatory Visit: Payer: Self-pay

## 2019-09-06 ENCOUNTER — Other Ambulatory Visit: Payer: BC Managed Care – PPO

## 2019-09-06 ENCOUNTER — Encounter: Payer: Self-pay | Admitting: Advanced Practice Midwife

## 2019-09-06 ENCOUNTER — Ambulatory Visit (INDEPENDENT_AMBULATORY_CARE_PROVIDER_SITE_OTHER): Payer: BC Managed Care – PPO | Admitting: Advanced Practice Midwife

## 2019-09-06 VITALS — BP 98/64 | HR 64 | Wt 170.0 lb

## 2019-09-06 DIAGNOSIS — Z23 Encounter for immunization: Secondary | ICD-10-CM | POA: Diagnosis not present

## 2019-09-06 DIAGNOSIS — Z3009 Encounter for other general counseling and advice on contraception: Secondary | ICD-10-CM

## 2019-09-06 DIAGNOSIS — Z3A27 27 weeks gestation of pregnancy: Secondary | ICD-10-CM

## 2019-09-06 DIAGNOSIS — Z3490 Encounter for supervision of normal pregnancy, unspecified, unspecified trimester: Secondary | ICD-10-CM

## 2019-09-06 DIAGNOSIS — Z3492 Encounter for supervision of normal pregnancy, unspecified, second trimester: Secondary | ICD-10-CM

## 2019-09-06 DIAGNOSIS — N898 Other specified noninflammatory disorders of vagina: Secondary | ICD-10-CM

## 2019-09-06 NOTE — Progress Notes (Signed)
   PRENATAL VISIT NOTE  Subjective:  Anita Sims is a 24 y.o. [phone] at [redacted]w[redacted]d being seen today for ongoing prenatal care.  She is currently monitored for the following issues for this low-risk pregnancy and has Polysubstance abuse (HCC); Elevated BP without diagnosis of hypertension; Miscarriage within last 12 months; Bacterial vaginitis; Supervision of normal pregnancy; Marijuana user; and Herpes simplex type 2 (HSV-2) infection affecting pregnancy, antepartum on their problem list.  Patient reports vaginal discharge without other symptoms.  Contractions: Not present. Vag. Bleeding: None.  Movement: Present. Denies leaking of fluid.   The following portions of the patient's history were reviewed and updated as appropriate: allergies, current medications, past family history, past medical history, past social history, past surgical history and problem list.   Objective:   Vitals:   09/06/19 0819  BP: 98/64  Pulse: 64  Weight: 170 lb (77.1 kg)    Fetal Status: Fetal Heart Rate (bpm): 146   Movement: Present     General:  Alert, oriented and cooperative. Patient is in no acute distress.  Skin: Skin is warm and dry. No rash noted.   Cardiovascular: Normal heart rate noted  Respiratory: Normal respiratory effort, no problems with respiration noted  Abdomen: Soft, gravid, appropriate for gestational age.  Pain/Pressure: Absent     Pelvic: Cervical exam deferred        Extremities: Normal range of motion.  Edema: None  Mental Status: Normal mood and affect. Normal behavior. Normal judgment and thought content.   Assessment and Plan:  Pregnancy: [id number] at [redacted]w[redacted]d 1. Encounter for supervision of normal pregnancy, antepartum, unspecified gravidity --Anticipatory guidance about next visits/weeks of pregnancy given. --Next visit in 4 weeks, virtual  - Glucose Tolerance, 2 Hours w/1 Hour - CBC - RPR - HIV Antibody (routine testing w rflx)  2. Need for Tdap vaccination - Tdap  vaccine greater than or equal to 7yo IM  3. Vaginal discharge --Pt denies any risks of STD.  She reports discharge has slight yellow color to it with no itching/burning/odor.  Discussed pregnancy and normal increase in discharge. Pt to report any itching/burning/odor or concerns for STDs but no need for testing at this time.   4. Unwanted fertility --BTL papers signed today  Preterm labor symptoms and general obstetric precautions including but not limited to vaginal bleeding, contractions, leaking of fluid and fetal movement were reviewed in detail with the patient. Please refer to After Visit Summary for other counseling recommendations.   No follow-ups on file.  No future appointments.  Sharen Counter, CNM

## 2019-09-06 NOTE — Progress Notes (Signed)
ROB  2hr GTT   CC: pt wants vaginal swab for yeast and BV.    T-Dap

## 2019-09-06 NOTE — Patient Instructions (Signed)

## 2019-09-07 LAB — GLUCOSE TOLERANCE, 2 HOURS W/ 1HR
Glucose, 1 hour: 81 mg/dL (ref 65–179)
Glucose, 2 hour: 69 mg/dL (ref 65–152)
Glucose, Fasting: 74 mg/dL (ref 65–91)

## 2019-09-07 LAB — CBC
Hematocrit: 34.7 % (ref 34.0–46.6)
Hemoglobin: 11.3 g/dL (ref 11.1–15.9)
MCH: 29 pg (ref 26.6–33.0)
MCHC: 32.6 g/dL (ref 31.5–35.7)
MCV: 89 fL (ref 79–97)
Platelets: 220 10*3/uL (ref 150–450)
RBC: 3.89 x10E6/uL (ref 3.77–5.28)
RDW: 14.4 % (ref 11.7–15.4)
WBC: 9.6 10*3/uL (ref 3.4–10.8)

## 2019-09-07 LAB — RPR: RPR Ser Ql: NONREACTIVE

## 2019-09-07 LAB — HIV ANTIBODY (ROUTINE TESTING W REFLEX): HIV Screen 4th Generation wRfx: NONREACTIVE

## 2019-10-04 ENCOUNTER — Telehealth: Payer: BC Managed Care – PPO | Admitting: Advanced Practice Midwife

## 2019-10-05 ENCOUNTER — Telehealth (INDEPENDENT_AMBULATORY_CARE_PROVIDER_SITE_OTHER): Payer: BC Managed Care – PPO | Admitting: Women's Health

## 2019-10-05 ENCOUNTER — Encounter: Payer: Self-pay | Admitting: Women's Health

## 2019-10-05 DIAGNOSIS — N760 Acute vaginitis: Secondary | ICD-10-CM

## 2019-10-05 DIAGNOSIS — B9689 Other specified bacterial agents as the cause of diseases classified elsewhere: Secondary | ICD-10-CM

## 2019-10-05 DIAGNOSIS — O98513 Other viral diseases complicating pregnancy, third trimester: Secondary | ICD-10-CM

## 2019-10-05 DIAGNOSIS — F1910 Other psychoactive substance abuse, uncomplicated: Secondary | ICD-10-CM

## 2019-10-05 DIAGNOSIS — Z3A31 31 weeks gestation of pregnancy: Secondary | ICD-10-CM

## 2019-10-05 DIAGNOSIS — R030 Elevated blood-pressure reading, without diagnosis of hypertension: Secondary | ICD-10-CM

## 2019-10-05 DIAGNOSIS — Z3483 Encounter for supervision of other normal pregnancy, third trimester: Secondary | ICD-10-CM

## 2019-10-05 DIAGNOSIS — B009 Herpesviral infection, unspecified: Secondary | ICD-10-CM

## 2019-10-05 NOTE — Patient Instructions (Addendum)
[place], [place] [phone])      Maternity Assessment Unit (MAU)  The Maternity Assessment Unit (MAU) is located at the [facility] and [place] at [facility]. The address is: [address], [place], [place], [place] [address]. Please see map below for additional directions.    The Maternity Assessment Unit is designed to help you during your pregnancy, and for up to 6 weeks after delivery, with any pregnancy- or postpartum-related emergencies, if you think you are in labor, or if your water has broken. For example, if you experience nausea and vomiting, vaginal bleeding, severe abdominal or pelvic pain, elevated blood pressure or other problems related to your pregnancy or postpartum time, please come to the Maternity Assessment Unit for assistance.      Preterm Labor and Birth Information  The normal length of a pregnancy is 39-41 weeks. Preterm labor is when labor starts before 37 completed weeks of pregnancy. What are the risk factors for preterm labor? Preterm labor is more likely to occur in women who:  Have certain infections during pregnancy such as a bladder infection, sexually transmitted infection, or infection inside the uterus (chorioamnionitis).  Have a shorter-than-normal cervix.  Have gone into preterm labor before.  Have had surgery on their cervix.  Are younger than age 78 or older than age 65.  Are African American.  Are pregnant with twins or multiple babies (multiple gestation).  Take street drugs or smoke while pregnant.  Do not gain enough weight while pregnant.  Became pregnant shortly after having been pregnant. What are the symptoms of preterm labor? Symptoms of preterm labor include:  Cramps similar to those that can happen during a menstrual period. The cramps may happen with diarrhea.  Pain in the abdomen or lower back.  Regular uterine contractions that may feel  like tightening of the abdomen.  A feeling of increased pressure in the pelvis.  Increased watery or bloody mucus discharge from the vagina.  Water breaking (ruptured amniotic sac). Why is it important to recognize signs of preterm labor? It is important to recognize signs of preterm labor because babies who are born prematurely may not be fully developed. This can put them at an increased risk for:  Long-term (chronic) heart and lung problems.  Difficulty immediately after birth with regulating body systems, including blood sugar, body temperature, heart rate, and breathing rate.  Bleeding in the brain.  Cerebral palsy.  Learning difficulties.  Death. These risks are highest for babies who are born before 67 weeks of pregnancy. How is preterm labor treated? Treatment depends on the length of your pregnancy, your condition, and the health of your baby. It may involve:  Having a stitch (suture) placed in your cervix to prevent your cervix from opening too early (cerclage).  Taking or being given medicines, such as: ? Hormone medicines. These may be given early in pregnancy to help support the pregnancy. ? Medicine to stop contractions. ? Medicines to help mature the baby's lungs. These may be prescribed if the risk of delivery is high. ? Medicines to prevent your baby from developing cerebral palsy. If the labor happens before 34 weeks of pregnancy, you may need to stay in the hospital. What should I do if I think I am in preterm labor? If you think that you are going into preterm labor, call your health care provider right away. How can I prevent preterm labor in future pregnancies? To increase  your chance of having a full-term pregnancy:  Do not use any tobacco products, such as cigarettes, chewing tobacco, and e-cigarettes. If you need help quitting, ask your health care provider.  Do not use street drugs or medicines that have not been prescribed to you during your  pregnancy.  Talk with your health care provider before taking any herbal supplements, even if you have been taking them regularly.  Make sure you gain a healthy amount of weight during your pregnancy.  Watch for infection. If you think that you might have an infection, get it checked right away.  Make sure to tell your health care provider if you have gone into preterm labor before. This information is not intended to replace advice given to you by your health care provider. Make sure you discuss any questions you have with your health care provider. Document Revised: 07/23/2018 Document Reviewed: 08/22/2015 Elsevier Patient Education  2020 [employer].

## 2019-10-05 NOTE — Progress Notes (Signed)
Virtual Visit via Telephone Note  I connected with Anita Sims on 10/05/19 at  8:35 AM EDT by telephone and verified that I am speaking with the correct person using two identifiers.  Pt states she never checks her BP at home. No complaints today per pt

## 2019-10-05 NOTE — Progress Notes (Signed)
I connected with Anita Sims. [name] on 10/05/19 at  8:35 AM EDT by: phone and verified that I am speaking with the correct person using two identifiers.  Patient is located at home and provider is located at [facility].     The purpose of this virtual visit is to provide medical care while limiting exposure to the novel coronavirus. I discussed the limitations, risks, security and privacy concerns of performing an evaluation and management service by phone and the availability of in person appointments. I also discussed with the patient that there may be a patient responsible charge related to this service. By engaging in this virtual visit, you consent to the provision of healthcare.  Additionally, you authorize for your insurance to be billed for the services provided during this visit.  The patient expressed understanding and agreed to proceed.  The following staff members participated in the virtual visit:  Anita Sims    PRENATAL VISIT NOTE  Subjective:  Anita Sims is a 24 y.o. [id number] at [redacted]w[redacted]d  for phone visit for ongoing prenatal care.  She is currently monitored for the following issues for this high-risk pregnancy and has Polysubstance abuse (HCC); Elevated BP without diagnosis of hypertension; Miscarriage within last 12 months; Bacterial vaginitis; Supervision of normal pregnancy; Marijuana user; and Herpes simplex type 2 (HSV-2) infection affecting pregnancy, antepartum on their problem list.  Patient reports no complaints.  Contractions: Not present. Vag. Bleeding: None.  Movement: Present. Denies leaking of fluid.   The following portions of the patient's history were reviewed and updated as appropriate: allergies, current medications, past family history, past medical history, past social history, past surgical history and problem list.   Objective:  There were no vitals filed for this visit.  Fetal Status:     Movement: Present     Assessment and Plan:  Pregnancy:  [id number] at [redacted]w[redacted]d  1. Encounter for supervision of other normal pregnancy in third trimester - no concerns  2. Bacterial vaginitis - no concerns  3. Elevated BP without diagnosis of hypertension -pt states has not picked BP cuff yet, but will do so today and will notify us if you have any issues so we can remedy the issues or have patient come in to the clinic this week for a blood pressure check -Phone number and address of [facility] given to patient  4. Polysubstance abuse (HCC) -pt states she has used marijuana in the past and never used cocaine  5. Herpes simplex type 2 (HSV-2) infection affecting pregnancy, antepartum -suppressive therapy at 35/36 weeks  Preterm labor symptoms and general obstetric precautions including but not limited to vaginal bleeding, contractions, leaking of fluid and fetal movement were reviewed in detail with the patient.  Return in about 2 weeks (around 10/19/2019) for in-person LOB/APP OK/BP check/cuff check.  No future appointments.   Time spent on virtual visit: 1 minutes  Marylen Ponto, NP

## 2019-10-14 ENCOUNTER — Encounter (HOSPITAL_COMMUNITY): Payer: Self-pay | Admitting: Obstetrics and Gynecology

## 2019-10-14 ENCOUNTER — Inpatient Hospital Stay (HOSPITAL_COMMUNITY)
Admission: AD | Admit: 2019-10-14 | Discharge: 2019-10-14 | Disposition: A | Discharge status: Home / Self Care | Payer: BC Managed Care – PPO | Attending: Obstetrics and Gynecology | Admitting: Obstetrics and Gynecology

## 2019-10-14 ENCOUNTER — Other Ambulatory Visit: Payer: Self-pay

## 2019-10-14 DIAGNOSIS — O99343 Other mental disorders complicating pregnancy, third trimester: Secondary | ICD-10-CM | POA: Diagnosis not present

## 2019-10-14 DIAGNOSIS — O98513 Other viral diseases complicating pregnancy, third trimester: Secondary | ICD-10-CM | POA: Diagnosis not present

## 2019-10-14 DIAGNOSIS — O26893 Other specified pregnancy related conditions, third trimester: Secondary | ICD-10-CM | POA: Diagnosis not present

## 2019-10-14 DIAGNOSIS — Z87891 Personal history of nicotine dependence: Secondary | ICD-10-CM | POA: Diagnosis not present

## 2019-10-14 DIAGNOSIS — B9689 Other specified bacterial agents as the cause of diseases classified elsewhere: Secondary | ICD-10-CM

## 2019-10-14 DIAGNOSIS — Z3483 Encounter for supervision of other normal pregnancy, third trimester: Secondary | ICD-10-CM

## 2019-10-14 DIAGNOSIS — F319 Bipolar disorder, unspecified: Secondary | ICD-10-CM | POA: Diagnosis not present

## 2019-10-14 DIAGNOSIS — R102 Pelvic and perineal pain: Secondary | ICD-10-CM | POA: Insufficient documentation

## 2019-10-14 DIAGNOSIS — N898 Other specified noninflammatory disorders of vagina: Secondary | ICD-10-CM | POA: Insufficient documentation

## 2019-10-14 DIAGNOSIS — Z3A33 33 weeks gestation of pregnancy: Secondary | ICD-10-CM | POA: Diagnosis not present

## 2019-10-14 LAB — URINALYSIS, ROUTINE W REFLEX MICROSCOPIC
Bilirubin Urine: NEGATIVE
Glucose, UA: NEGATIVE mg/dL
Hgb urine dipstick: NEGATIVE
Ketones, ur: NEGATIVE mg/dL
Nitrite: NEGATIVE
Protein, ur: NEGATIVE mg/dL
Specific Gravity, Urine: 1.008 (ref 1.005–1.030)
pH: 7 (ref 5.0–8.0)

## 2019-10-14 LAB — WET PREP, GENITAL
Clue Cells Wet Prep HPF POC: NONE SEEN
Sperm: NONE SEEN
Trich, Wet Prep: NONE SEEN
Yeast Wet Prep HPF POC: NONE SEEN

## 2019-10-14 NOTE — Discharge Instructions (Signed)

## 2019-10-14 NOTE — MAU Note (Signed)
Pt reports pelvic pressure since Monday but is not feeling today. Also thinks she has a yeast infection. Denies LOF, VB, +FM

## 2019-10-14 NOTE — MAU Provider Note (Signed)
Chief Complaint:  Abdominal Pain   First Provider Initiated Contact with Patient 10/14/19 1551     HPI: Anita Sims is a 24 y.o. [id number] at 4w1dwho presents to maternity admissions reporting profuse vaginal discharge, vaginal itching, and pelvic pressure the past two days (none today). She used an OTC vaginal suppository for yeast overnight (HoneyPot - boric acid, tea tree oil, and coconut oil) which helped the itching but not the discharge. Denies vaginal bleeding, leaking of fluid, decreased fetal movement, fever, falls, or recent illness.   Pregnancy Course: Normal - hx of herpes and MJ use  Past Medical History:  Diagnosis Date  . Anemia    hx of, but not during pregnancy  . Anxiety    "was on medication long time ago"; pt states  . Bipolar 1 disorder ([place]    "was on medication a long time ago" pt states  . Chlamydia   . Genital herpes   . Gonorrhea   . Substance abuse ([place]    OB History  Gravida Para Term Preterm AB Living  _0 0 1 2  SAB TAB Ectopic Multiple Live Births  1 0 0 0 2    # Outcome Date GA Lbr Len/2nd Weight Sex Delivery Anes PTL Lv  4 Current           3 SAB 02/2019          2 Term 11/16/18 363w6d4:44 / 00:02 8 lb 4.8 oz (3.765 kg) M Vag-Spont None  LIV  1 Term 09/01/17 376w1d:21 / 00:12 7 lb 0.2 oz (3.181 kg) F Vag-Spont None  LIV   Past Surgical History:  Procedure Laterality Date  . MOUTH SURGERY     Family History  Problem Relation Age of Onset  . Diabetes Maternal Grandfather        type II  . Cancer Maternal Grandfather   . Heart disease Maternal Grandmother   . Heart disease Mother    Social History   Tobacco Use  . Smoking status: Former Smoker    Types: Cigars  . Smokeless tobacco: Never Used  . Tobacco comment: Hookah  Vaping Use  . Vaping Use: Never used  Substance Use Topics  . Alcohol use: Not Currently    Comment: last use 02/12/2019  . Drug use: Not Currently    Types: Marijuana    Comment: Last use-  02/12/2019   No Known Allergies No medications prior to admission.    I have reviewed patient's Past Medical Hx, Surgical Hx, Family Hx, Social Hx, medications and allergies.   ROS:  Review of Systems  Constitutional: Negative.  Negative for chills, diaphoresis, fatigue and fever.  HENT: Negative.   Eyes: Negative for photophobia and visual disturbance.  Respiratory: Negative for cough, chest tightness and shortness of breath.   Cardiovascular: Negative.   Gastrointestinal: Negative for abdominal pain, constipation, diarrhea, nausea and vomiting.  Endocrine: Negative.   Genitourinary: Positive for urgency (having a difficult time holding her urine) and vaginal discharge. Negative for pelvic pain, vaginal bleeding and vaginal pain.  Musculoskeletal: Negative.   Skin: Negative.   Allergic/Immunologic: Negative.   Neurological: Negative for dizziness, light-headedness and headaches.  Hematological: Negative.   Psychiatric/Behavioral: Negative.     Physical Exam   Patient Vitals for the past 24 hrs:  BP Temp Pulse Resp SpO2 Height Weight  10/14/19 1518 -- 98.4 F (36.9 C) -- -- -- -- --  10/14/19 1516 (!) 109/58 -- 80 18 100 %  _0  (1.6 m) 179 lb 12.8 oz (81.6 kg)    Constitutional: Well-developed, well-nourished female in no acute distress.  Cardiovascular: normal rate & rhythm, no murmur Respiratory: normal effort, lung sounds clear throughout GI: Abd soft, non-tender, gravid appropriate for gestational age. Pos BS x 4 MS: Extremities nontender, no edema, normal ROM Neurologic: Alert and oriented x 4.  GU:      Pelvic: NEFG, copious watery yellow discharge, no CMT, no blood, cervix closed.  Dilation: Closed (on visual examination with speculum) Exam by:: Gaylan Gerold, CNM  Fetal Tracing: reactive Baseline: 135 Variability: moderate Accelerations: + Decelerations: none Toco: UI   Labs: Results for orders placed or performed during the hospital encounter of  10/14/19 (from the past 24 hour(s))  Urinalysis, Routine w reflex microscopic     Status: Abnormal   Collection Time: 10/14/19  4:01 PM  Result Value Ref Range   Color, Urine YELLOW YELLOW   APPearance HAZY (A) CLEAR   Specific Gravity, Urine 1.008 1.005 - 1.030   pH 7.0 5.0 - 8.0   Glucose, UA NEGATIVE NEGATIVE mg/dL   Hgb urine dipstick NEGATIVE NEGATIVE   Bilirubin Urine NEGATIVE NEGATIVE   Ketones, ur NEGATIVE NEGATIVE mg/dL   Protein, ur NEGATIVE NEGATIVE mg/dL   Nitrite NEGATIVE NEGATIVE   Leukocytes,Ua LARGE (A) NEGATIVE   RBC / HPF 0-5 0 - 5 RBC/hpf   WBC, UA 21-50 0 - 5 WBC/hpf   Bacteria, UA RARE (A) NONE SEEN   Squamous Epithelial / LPF 11-20 0 - 5  Wet prep, genital     Status: Abnormal   Collection Time: 10/14/19  4:05 PM   Specimen: Cervix  Result Value Ref Range   Yeast Wet Prep HPF POC NONE SEEN NONE SEEN   Trich, Wet Prep NONE SEEN NONE SEEN   Clue Cells Wet Prep HPF POC NONE SEEN NONE SEEN   WBC, Wet Prep HPF POC MANY (A) NONE SEEN   Sperm NONE SEEN     Imaging:  None   MAU Course: Orders Placed This Encounter  Procedures  . Wet prep, genital  . Culture, OB Urine  . Urinalysis, Routine w reflex microscopic  . Discharge patient   MDM: UA - many WBC, rare bacteria, C&S ordered Wet prep - WNL + many WBC GC/CT - pending  Assessment: Vaginal discharge in 3rd trimester   Plan: Discharge home in stable condition.  Will send results and necessary treatment via MyChart if needed.    Follow-up Information    [facility]. Go to.   Why: as scheduled Contact information: [address] [place] [address] [phone]              Allergies as of 10/14/2019   No Known Allergies     Medication List    STOP taking these medications   terconazole 0.4 % vaginal cream Commonly known as: TERAZOL 7     TAKE these medications   Blood Pressure Monitor Kit 1 Device by Does not apply route once a week. To  be monitored Regularly at home.   Rowan Supp Sm Misc Wear as directed.   loratadine 10 MG tablet Commonly known as: Claritin Take 1 tablet (10 mg total) by mouth daily.   multivitamin tablet Take 1 tablet by mouth daily.   prenatal vitamin w/FE, FA 27-1 MG Tabs tablet Take 1 tablet by mouth daily before breakfast.   valACYclovir 500 MG tablet Commonly known as: VALTREX  Take 1 tablet (500 mg total) by mouth daily.       Gabriel Carina, [place] 10/14/2019 4:49 PM

## 2019-10-15 LAB — CULTURE, OB URINE: Culture: NO GROWTH

## 2019-10-18 LAB — GC/CHLAMYDIA PROBE AMP (~~LOC~~) NOT AT ARMC
Chlamydia: NEGATIVE
Comment: NEGATIVE
Comment: NORMAL
Neisseria Gonorrhea: NEGATIVE

## 2019-10-19 ENCOUNTER — Ambulatory Visit (INDEPENDENT_AMBULATORY_CARE_PROVIDER_SITE_OTHER): Payer: BC Managed Care – PPO | Admitting: Obstetrics and Gynecology

## 2019-10-19 ENCOUNTER — Encounter: Payer: Self-pay | Admitting: Obstetrics and Gynecology

## 2019-10-19 ENCOUNTER — Other Ambulatory Visit: Payer: Self-pay

## 2019-10-19 VITALS — BP 101/64 | HR 82 | Wt 178.0 lb

## 2019-10-19 DIAGNOSIS — F1910 Other psychoactive substance abuse, uncomplicated: Secondary | ICD-10-CM

## 2019-10-19 DIAGNOSIS — Z3483 Encounter for supervision of other normal pregnancy, third trimester: Secondary | ICD-10-CM

## 2019-10-19 DIAGNOSIS — O98519 Other viral diseases complicating pregnancy, unspecified trimester: Secondary | ICD-10-CM

## 2019-10-19 DIAGNOSIS — Z3009 Encounter for other general counseling and advice on contraception: Secondary | ICD-10-CM

## 2019-10-19 DIAGNOSIS — F1290 Cannabis use, unspecified, uncomplicated: Secondary | ICD-10-CM

## 2019-10-19 DIAGNOSIS — Z3A33 33 weeks gestation of pregnancy: Secondary | ICD-10-CM

## 2019-10-19 DIAGNOSIS — B009 Herpesviral infection, unspecified: Secondary | ICD-10-CM

## 2019-10-19 DIAGNOSIS — O98513 Other viral diseases complicating pregnancy, third trimester: Secondary | ICD-10-CM

## 2019-10-19 DIAGNOSIS — O99323 Drug use complicating pregnancy, third trimester: Secondary | ICD-10-CM

## 2019-10-19 MED ORDER — VALACYCLOVIR HCL 500 MG PO TABS: 500.0000 mg | ORAL_TABLET | Freq: Two times a day (BID) | ORAL | 6 refills | Status: DC

## 2019-10-19 NOTE — Progress Notes (Signed)
Pt is here for ROB, [redacted]w[redacted]d.

## 2019-10-19 NOTE — Progress Notes (Signed)
   PRENATAL VISIT NOTE  Subjective:  Anita Sims is a 24 y.o. [phone] at [redacted]w[redacted]d being seen today for ongoing prenatal care.  She is currently monitored for the following issues for this low-risk pregnancy and has Polysubstance abuse (HCC); Miscarriage within last 12 months; Bacterial vaginitis; Supervision of normal pregnancy; Marijuana user; Herpes simplex type 2 (HSV-2) infection affecting pregnancy, antepartum; and Unwanted fertility on their problem list.  Patient reports no complaints.  Contractions: Not present. Vag. Bleeding: None.  Movement: Present. Denies leaking of fluid.   The following portions of the patient's history were reviewed and updated as appropriate: allergies, current medications, past family history, past medical history, past social history, past surgical history and problem list.   Objective:   Vitals:   10/19/19 1121  BP: 101/64  Pulse: 82  Weight: 178 lb (80.7 kg)    Fetal Status: Fetal Heart Rate (bpm): 145   Movement: Present     General:  Alert, oriented and cooperative. Patient is in no acute distress.  Skin: Skin is warm and dry. No rash noted.   Cardiovascular: Normal heart rate noted  Respiratory: Normal respiratory effort, no problems with respiration noted  Abdomen: Soft, gravid, appropriate for gestational age.  Pain/Pressure: Absent     Pelvic: Cervical exam deferred        Extremities: Normal range of motion.  Edema: None  Mental Status: Normal mood and affect. Normal behavior. Normal judgment and thought content.   Assessment and Plan:  Pregnancy: [id number] at [redacted]w[redacted]d  1. Encounter for supervision of other normal pregnancy in third trimester   2. Polysubstance abuse (HCC) MJ positive on UDS  3. Herpes simplex type 2 (HSV-2) infection affecting pregnancy, antepartum Start valtrex ppx today  4. Unwanted fertility For BTL  Preterm labor symptoms and general obstetric precautions including but not limited to vaginal bleeding,  contractions, leaking of fluid and fetal movement were reviewed in detail with the patient. Please refer to After Visit Summary for other counseling recommendations.   Return in about 2 weeks (around 11/02/2019) for in person, 36 week swabs, low OB.  No future appointments.  Conan Bowens, MD

## 2019-11-02 ENCOUNTER — Encounter: Payer: Self-pay | Admitting: Obstetrics and Gynecology

## 2019-11-02 ENCOUNTER — Other Ambulatory Visit (HOSPITAL_COMMUNITY)
Admission: RE | Admit: 2019-11-02 | Discharge: 2019-11-02 | Disposition: A | Discharge status: Home / Self Care | Payer: BC Managed Care – PPO | Source: Ambulatory Visit | Attending: Obstetrics and Gynecology | Admitting: Obstetrics and Gynecology

## 2019-11-02 ENCOUNTER — Other Ambulatory Visit: Payer: Self-pay

## 2019-11-02 ENCOUNTER — Ambulatory Visit (INDEPENDENT_AMBULATORY_CARE_PROVIDER_SITE_OTHER): Payer: BC Managed Care – PPO | Admitting: Obstetrics and Gynecology

## 2019-11-02 VITALS — BP 99/62 | HR 80 | Wt 179.6 lb

## 2019-11-02 DIAGNOSIS — B009 Herpesviral infection, unspecified: Secondary | ICD-10-CM

## 2019-11-02 DIAGNOSIS — O98513 Other viral diseases complicating pregnancy, third trimester: Secondary | ICD-10-CM

## 2019-11-02 DIAGNOSIS — Z3483 Encounter for supervision of other normal pregnancy, third trimester: Secondary | ICD-10-CM

## 2019-11-02 DIAGNOSIS — O98519 Other viral diseases complicating pregnancy, unspecified trimester: Secondary | ICD-10-CM

## 2019-11-02 DIAGNOSIS — Z3A35 35 weeks gestation of pregnancy: Secondary | ICD-10-CM

## 2019-11-02 NOTE — Progress Notes (Signed)
   PRENATAL VISIT NOTE  Subjective:  Anita Sims is a 24 y.o. [phone] at [redacted]w[redacted]d being seen today for ongoing prenatal care.  She is currently monitored for the following issues for this low-risk pregnancy and has Polysubstance abuse (HCC); Miscarriage within last 12 months; Bacterial vaginitis; Supervision of normal pregnancy; Marijuana user; Herpes simplex type 2 (HSV-2) infection affecting pregnancy, antepartum; and Unwanted fertility on their problem list.  Patient reports no complaints.  Contractions: Irritability. Vag. Bleeding: None.  Movement: Present. Denies leaking of fluid.   The following portions of the patient's history were reviewed and updated as appropriate: allergies, current medications, past family history, past medical history, past social history, past surgical history and problem list.   Objective:   Vitals:   11/02/19 1047  BP: 99/62  Pulse: 80  Weight: 179 lb 9.6 oz (81.5 kg)    Fetal Status: Fetal Heart Rate (bpm): 135 Fundal Height: 36 cm Movement: Present  Presentation: Vertex  General:  Alert, oriented and cooperative. Patient is in no acute distress.  Skin: Skin is warm and dry. No rash noted.   Cardiovascular: Normal heart rate noted  Respiratory: Normal respiratory effort, no problems with respiration noted  Abdomen: Soft, gravid, appropriate for gestational age.  Pain/Pressure: Absent     Pelvic: Cervical exam performed in the presence of a chaperone Dilation: 1 Effacement (%): 50 Station: -3  Extremities: Normal range of motion.  Edema: None  Mental Status: Normal mood and affect. Normal behavior. Normal judgment and thought content.   Assessment and Plan:  Pregnancy: [id number] at [redacted]w[redacted]d 1. Encounter for supervision of other normal pregnancy in third trimester Patient is doing well today Cultures collected  2. Herpes simplex type 2 (HSV-2) infection affecting pregnancy, antepartum Continue suppressive therapy  Preterm labor symptoms and  general obstetric precautions including but not limited to vaginal bleeding, contractions, leaking of fluid and fetal movement were reviewed in detail with the patient. Please refer to After Visit Summary for other counseling recommendations.   Return in about 1 week (around 11/09/2019) for in person, ROB, Low risk.  No future appointments.  Catalina Antigua, MD

## 2019-11-03 LAB — CERVICOVAGINAL ANCILLARY ONLY
Chlamydia: NEGATIVE
Comment: NEGATIVE
Comment: NORMAL
Neisseria Gonorrhea: NEGATIVE

## 2019-11-06 LAB — CULTURE, BETA STREP (GROUP B ONLY): Strep Gp B Culture: NEGATIVE

## 2019-11-09 ENCOUNTER — Encounter: Payer: BC Managed Care – PPO | Admitting: Advanced Practice Midwife

## 2019-11-16 ENCOUNTER — Encounter: Payer: BC Managed Care – PPO | Admitting: Medical

## 2019-11-16 DIAGNOSIS — Z3483 Encounter for supervision of other normal pregnancy, third trimester: Secondary | ICD-10-CM

## 2019-11-26 ENCOUNTER — Inpatient Hospital Stay (HOSPITAL_COMMUNITY): Payer: BC Managed Care – PPO | Admitting: Anesthesiology

## 2019-11-26 ENCOUNTER — Encounter (HOSPITAL_COMMUNITY)
Admission: AD | Disposition: A | Discharge status: Home / Self Care | Payer: Self-pay | Source: Home / Self Care | Attending: Obstetrics and Gynecology

## 2019-11-26 ENCOUNTER — Other Ambulatory Visit: Payer: Self-pay

## 2019-11-26 ENCOUNTER — Encounter (HOSPITAL_COMMUNITY): Payer: Self-pay | Admitting: Family Medicine

## 2019-11-26 ENCOUNTER — Inpatient Hospital Stay (HOSPITAL_COMMUNITY)
Admission: AD | Admit: 2019-11-26 | Discharge: 2019-11-28 | DRG: 784 | Disposition: A | Discharge status: Home / Self Care | Payer: BC Managed Care – PPO | Attending: Obstetrics and Gynecology | Admitting: Obstetrics and Gynecology

## 2019-11-26 DIAGNOSIS — Z3009 Encounter for other general counseling and advice on contraception: Secondary | ICD-10-CM | POA: Diagnosis present

## 2019-11-26 DIAGNOSIS — O9832 Other infections with a predominantly sexual mode of transmission complicating childbirth: Principal | ICD-10-CM | POA: Diagnosis present

## 2019-11-26 DIAGNOSIS — E669 Obesity, unspecified: Secondary | ICD-10-CM | POA: Diagnosis present

## 2019-11-26 DIAGNOSIS — A6009 Herpesviral infection of other urogenital tract: Secondary | ICD-10-CM

## 2019-11-26 DIAGNOSIS — Z8759 Personal history of other complications of pregnancy, childbirth and the puerperium: Secondary | ICD-10-CM

## 2019-11-26 DIAGNOSIS — O99324 Drug use complicating childbirth: Secondary | ICD-10-CM | POA: Diagnosis present

## 2019-11-26 DIAGNOSIS — O99214 Obesity complicating childbirth: Secondary | ICD-10-CM | POA: Diagnosis present

## 2019-11-26 DIAGNOSIS — Z3490 Encounter for supervision of normal pregnancy, unspecified, unspecified trimester: Secondary | ICD-10-CM

## 2019-11-26 DIAGNOSIS — B009 Herpesviral infection, unspecified: Secondary | ICD-10-CM | POA: Diagnosis present

## 2019-11-26 DIAGNOSIS — Z302 Encounter for sterilization: Secondary | ICD-10-CM

## 2019-11-26 DIAGNOSIS — Z3A39 39 weeks gestation of pregnancy: Secondary | ICD-10-CM

## 2019-11-26 DIAGNOSIS — N760 Acute vaginitis: Secondary | ICD-10-CM

## 2019-11-26 DIAGNOSIS — A6000 Herpesviral infection of urogenital system, unspecified: Secondary | ICD-10-CM | POA: Diagnosis present

## 2019-11-26 DIAGNOSIS — Z87891 Personal history of nicotine dependence: Secondary | ICD-10-CM | POA: Diagnosis not present

## 2019-11-26 DIAGNOSIS — Z20822 Contact with and (suspected) exposure to covid-19: Secondary | ICD-10-CM | POA: Diagnosis present

## 2019-11-26 DIAGNOSIS — Z3483 Encounter for supervision of other normal pregnancy, third trimester: Secondary | ICD-10-CM

## 2019-11-26 DIAGNOSIS — F1910 Other psychoactive substance abuse, uncomplicated: Secondary | ICD-10-CM | POA: Diagnosis present

## 2019-11-26 DIAGNOSIS — Z98891 History of uterine scar from previous surgery: Secondary | ICD-10-CM

## 2019-11-26 DIAGNOSIS — B9689 Other specified bacterial agents as the cause of diseases classified elsewhere: Secondary | ICD-10-CM

## 2019-11-26 DIAGNOSIS — F1290 Cannabis use, unspecified, uncomplicated: Secondary | ICD-10-CM | POA: Diagnosis present

## 2019-11-26 LAB — CBC
HCT: 34 % — ABNORMAL LOW (ref 36.0–46.0)
Hemoglobin: 10.8 g/dL — ABNORMAL LOW (ref 12.0–15.0)
MCH: 27.1 pg (ref 26.0–34.0)
MCHC: 31.8 g/dL (ref 30.0–36.0)
MCV: 85.2 fL (ref 80.0–100.0)
Platelets: 218 10*3/uL (ref 150–400)
RBC: 3.99 MIL/uL (ref 3.87–5.11)
RDW: 14.3 % (ref 11.5–15.5)
WBC: 9.4 10*3/uL (ref 4.0–10.5)
nRBC: 0 % (ref 0.0–0.2)

## 2019-11-26 LAB — TYPE AND SCREEN
ABO/RH(D): B POS
Antibody Screen: NEGATIVE

## 2019-11-26 LAB — POCT FERN TEST: POCT Fern Test: POSITIVE

## 2019-11-26 LAB — RPR: RPR Ser Ql: NONREACTIVE

## 2019-11-26 LAB — SARS CORONAVIRUS 2 BY RT PCR (HOSPITAL ORDER, PERFORMED IN ~~LOC~~ HOSPITAL LAB): SARS Coronavirus 2: NEGATIVE

## 2019-11-26 SURGERY — Surgical Case
Anesthesia: Spinal | Wound class: Clean Contaminated

## 2019-11-26 MED ORDER — LACTATED RINGERS IV SOLN: INTRAVENOUS | Status: DC

## 2019-11-26 MED ORDER — DEXMEDETOMIDINE HCL 200 MCG/2ML IV SOLN
INTRAVENOUS | Status: DC | PRN
  Administered 2019-11-26: 8 ug via INTRAVENOUS

## 2019-11-26 MED ORDER — CEFAZOLIN SODIUM-DEXTROSE 2-3 GM-%(50ML) IV SOLR
INTRAVENOUS | Status: DC | PRN
  Administered 2019-11-26: 2 g via INTRAVENOUS

## 2019-11-26 MED ORDER — SODIUM CHLORIDE 0.9 % IR SOLN
Status: DC | PRN
  Administered 2019-11-26: 1

## 2019-11-26 MED ORDER — NALOXONE HCL 4 MG/10ML IJ SOLN
1.0000 ug/kg/h | INTRAVENOUS | Status: DC | PRN
  Filled 2019-11-26: qty 5

## 2019-11-26 MED ORDER — DIBUCAINE (PERIANAL) 1 % EX OINT: 1.0000 "application " | TOPICAL_OINTMENT | CUTANEOUS | Status: DC | PRN

## 2019-11-26 MED ORDER — TERBUTALINE SULFATE 1 MG/ML IJ SOLN: 0.2500 mg | Freq: Once | INTRAMUSCULAR | Status: AC

## 2019-11-26 MED ORDER — NALOXONE HCL 0.4 MG/ML IJ SOLN: 0.4000 mg | INTRAMUSCULAR | Status: DC | PRN

## 2019-11-26 MED ORDER — FENTANYL CITRATE (PF) 100 MCG/2ML IJ SOLN
INTRAMUSCULAR | Status: AC
  Filled 2019-11-26: qty 2

## 2019-11-26 MED ORDER — FENTANYL-BUPIVACAINE-NACL 0.5-0.125-0.9 MG/250ML-% EP SOLN
EPIDURAL | Status: AC
  Filled 2019-11-26: qty 250

## 2019-11-26 MED ORDER — ONDANSETRON HCL 4 MG/2ML IJ SOLN
INTRAMUSCULAR | Status: AC
  Filled 2019-11-26: qty 2

## 2019-11-26 MED ORDER — MEPERIDINE HCL 25 MG/ML IJ SOLN: 6.2500 mg | INTRAMUSCULAR | Status: DC | PRN

## 2019-11-26 MED ORDER — NALBUPHINE HCL 10 MG/ML IJ SOLN: 5.0000 mg | INTRAMUSCULAR | Status: DC | PRN

## 2019-11-26 MED ORDER — ONDANSETRON HCL 4 MG/2ML IJ SOLN
INTRAMUSCULAR | Status: DC | PRN
  Administered 2019-11-26: 4 mg via INTRAVENOUS

## 2019-11-26 MED ORDER — MORPHINE SULFATE (PF) 0.5 MG/ML IJ SOLN
INTRAMUSCULAR | Status: DC | PRN
  Administered 2019-11-26: 150 ug via INTRATHECAL

## 2019-11-26 MED ORDER — FENTANYL CITRATE (PF) 100 MCG/2ML IJ SOLN
INTRAMUSCULAR | Status: DC | PRN
  Administered 2019-11-26: 15 ug via INTRATHECAL

## 2019-11-26 MED ORDER — KETOROLAC TROMETHAMINE 30 MG/ML IJ SOLN
30.0000 mg | Freq: Four times a day (QID) | INTRAMUSCULAR | Status: AC | PRN
  Administered 2019-11-26: 30 mg via INTRAVENOUS
  Filled 2019-11-26: qty 1

## 2019-11-26 MED ORDER — SODIUM CHLORIDE 0.9% FLUSH: 3.0000 mL | INTRAVENOUS | Status: DC | PRN

## 2019-11-26 MED ORDER — DIPHENHYDRAMINE HCL 50 MG/ML IJ SOLN: 12.5000 mg | INTRAMUSCULAR | Status: DC | PRN

## 2019-11-26 MED ORDER — OXYTOCIN-SODIUM CHLORIDE 30-0.9 UT/500ML-% IV SOLN
2.5000 [IU]/h | INTRAVENOUS | Status: AC
  Administered 2019-11-26: 2.5 [IU]/h via INTRAVENOUS
  Filled 2019-11-26: qty 500

## 2019-11-26 MED ORDER — OXYTOCIN-SODIUM CHLORIDE 30-0.9 UT/500ML-% IV SOLN
INTRAVENOUS | Status: AC
  Filled 2019-11-26: qty 500

## 2019-11-26 MED ORDER — PRENATAL MULTIVITAMIN CH
1.0000 | ORAL_TABLET | Freq: Every day | ORAL | Status: DC
  Administered 2019-11-27: 1 via ORAL
  Filled 2019-11-26: qty 1

## 2019-11-26 MED ORDER — VALACYCLOVIR HCL 500 MG PO TABS
500.0000 mg | ORAL_TABLET | Freq: Two times a day (BID) | ORAL | Status: DC
  Administered 2019-11-26 – 2019-11-27 (×3): 500 mg via ORAL
  Filled 2019-11-26 (×5): qty 1

## 2019-11-26 MED ORDER — LACTATED RINGERS IV SOLN: 500.0000 mL | Freq: Once | INTRAVENOUS | Status: DC

## 2019-11-26 MED ORDER — SENNOSIDES-DOCUSATE SODIUM 8.6-50 MG PO TABS: 2.0000 | ORAL_TABLET | Freq: Every evening | ORAL | Status: DC | PRN

## 2019-11-26 MED ORDER — BUPIVACAINE IN DEXTROSE 0.75-8.25 % IT SOLN
INTRATHECAL | Status: DC | PRN
  Administered 2019-11-26: 1.6 mL via INTRATHECAL

## 2019-11-26 MED ORDER — WITCH HAZEL-GLYCERIN EX PADS: 1.0000 "application " | MEDICATED_PAD | CUTANEOUS | Status: DC | PRN

## 2019-11-26 MED ORDER — LACTATED RINGERS IV BOLUS
1000.0000 mL | Freq: Once | INTRAVENOUS | Status: AC
  Administered 2019-11-26: 1000 mL via INTRAVENOUS

## 2019-11-26 MED ORDER — FENTANYL-BUPIVACAINE-NACL 0.5-0.125-0.9 MG/250ML-% EP SOLN: 12.0000 mL/h | EPIDURAL | Status: DC | PRN

## 2019-11-26 MED ORDER — KETOROLAC TROMETHAMINE 30 MG/ML IJ SOLN: 30.0000 mg | Freq: Four times a day (QID) | INTRAMUSCULAR | Status: AC | PRN

## 2019-11-26 MED ORDER — DIPHENHYDRAMINE HCL 25 MG PO CAPS: 25.0000 mg | ORAL_CAPSULE | ORAL | Status: DC | PRN

## 2019-11-26 MED ORDER — TERBUTALINE SULFATE 1 MG/ML IJ SOLN
INTRAMUSCULAR | Status: AC
  Administered 2019-11-26: 0.25 mg via SUBCUTANEOUS
  Filled 2019-11-26: qty 1

## 2019-11-26 MED ORDER — OXYCODONE HCL 5 MG PO TABS: 5.0000 mg | ORAL_TABLET | Freq: Once | ORAL | Status: DC | PRN

## 2019-11-26 MED ORDER — PHENYLEPHRINE HCL-NACL 20-0.9 MG/250ML-% IV SOLN
INTRAVENOUS | Status: DC | PRN
  Administered 2019-11-26: 60 ug/min via INTRAVENOUS

## 2019-11-26 MED ORDER — TETANUS-DIPHTH-ACELL PERTUSSIS 5-2.5-18.5 LF-MCG/0.5 IM SUSP: 0.5000 mL | Freq: Once | INTRAMUSCULAR | Status: DC

## 2019-11-26 MED ORDER — ENOXAPARIN SODIUM 40 MG/0.4ML ~~LOC~~ SOLN
40.0000 mg | SUBCUTANEOUS | Status: DC
  Administered 2019-11-27 – 2019-11-28 (×2): 40 mg via SUBCUTANEOUS
  Filled 2019-11-26 (×2): qty 0.4

## 2019-11-26 MED ORDER — NALBUPHINE HCL 10 MG/ML IJ SOLN: 5.0000 mg | Freq: Once | INTRAMUSCULAR | Status: DC | PRN

## 2019-11-26 MED ORDER — PHENYLEPHRINE 40 MCG/ML (10ML) SYRINGE FOR IV PUSH (FOR BLOOD PRESSURE SUPPORT): 80.0000 ug | PREFILLED_SYRINGE | INTRAVENOUS | Status: DC | PRN

## 2019-11-26 MED ORDER — FENTANYL CITRATE (PF) 100 MCG/2ML IJ SOLN
25.0000 ug | INTRAMUSCULAR | Status: DC | PRN
  Administered 2019-11-26 (×2): 50 ug via INTRAVENOUS

## 2019-11-26 MED ORDER — PHENYLEPHRINE 40 MCG/ML (10ML) SYRINGE FOR IV PUSH (FOR BLOOD PRESSURE SUPPORT)
PREFILLED_SYRINGE | INTRAVENOUS | Status: AC
  Filled 2019-11-26: qty 10

## 2019-11-26 MED ORDER — FAMOTIDINE IN NACL 20-0.9 MG/50ML-% IV SOLN
20.0000 mg | Freq: Once | INTRAVENOUS | Status: AC
  Administered 2019-11-26: 20 mg via INTRAVENOUS
  Filled 2019-11-26: qty 50

## 2019-11-26 MED ORDER — SOD CITRATE-CITRIC ACID 500-334 MG/5ML PO SOLN
30.0000 mL | Freq: Once | ORAL | Status: AC
  Administered 2019-11-26: 30 mL via ORAL
  Filled 2019-11-26: qty 30

## 2019-11-26 MED ORDER — IBUPROFEN 800 MG PO TABS
800.0000 mg | ORAL_TABLET | Freq: Three times a day (TID) | ORAL | Status: DC
  Administered 2019-11-26 – 2019-11-27 (×3): 800 mg via ORAL
  Filled 2019-11-26 (×3): qty 1

## 2019-11-26 MED ORDER — SCOPOLAMINE 1 MG/3DAYS TD PT72
1.0000 | MEDICATED_PATCH | Freq: Once | TRANSDERMAL | Status: DC
  Administered 2019-11-26: 1.5 mg via TRANSDERMAL
  Filled 2019-11-26: qty 1

## 2019-11-26 MED ORDER — ONDANSETRON HCL 4 MG/2ML IJ SOLN: 4.0000 mg | Freq: Three times a day (TID) | INTRAMUSCULAR | Status: DC | PRN

## 2019-11-26 MED ORDER — OXYCODONE HCL 5 MG PO TABS
5.0000 mg | ORAL_TABLET | Freq: Four times a day (QID) | ORAL | Status: DC | PRN
  Administered 2019-11-27: 10 mg via ORAL
  Administered 2019-11-27 (×2): 5 mg via ORAL
  Filled 2019-11-26: qty 1
  Filled 2019-11-26: qty 2
  Filled 2019-11-26: qty 1

## 2019-11-26 MED ORDER — EPHEDRINE 5 MG/ML INJ: 10.0000 mg | INTRAVENOUS | Status: DC | PRN

## 2019-11-26 MED ORDER — SIMETHICONE 80 MG PO CHEW
80.0000 mg | CHEWABLE_TABLET | Freq: Three times a day (TID) | ORAL | Status: DC
  Administered 2019-11-27 (×3): 80 mg via ORAL
  Filled 2019-11-26 (×4): qty 1

## 2019-11-26 MED ORDER — PROMETHAZINE HCL 25 MG/ML IJ SOLN: 6.2500 mg | INTRAMUSCULAR | Status: DC | PRN

## 2019-11-26 MED ORDER — MENTHOL 3 MG MT LOZG: 1.0000 | LOZENGE | OROMUCOSAL | Status: DC | PRN

## 2019-11-26 MED ORDER — MORPHINE SULFATE (PF) 0.5 MG/ML IJ SOLN
INTRAMUSCULAR | Status: AC
  Filled 2019-11-26: qty 10

## 2019-11-26 MED ORDER — ACETAMINOPHEN 325 MG PO TABS
650.0000 mg | ORAL_TABLET | ORAL | Status: DC
  Administered 2019-11-26 – 2019-11-27 (×7): 650 mg via ORAL
  Filled 2019-11-26 (×9): qty 2

## 2019-11-26 MED ORDER — SODIUM CHLORIDE 0.9 % IV SOLN
INTRAVENOUS | Status: AC
  Filled 2019-11-26: qty 500

## 2019-11-26 MED ORDER — POLYETHYLENE GLYCOL 3350 17 G PO PACK
17.0000 g | PACK | Freq: Every day | ORAL | Status: DC
  Administered 2019-11-27 – 2019-11-28 (×2): 17 g via ORAL
  Filled 2019-11-26 (×2): qty 1

## 2019-11-26 MED ORDER — DEXMEDETOMIDINE (PRECEDEX) IN NS 20 MCG/5ML (4 MCG/ML) IV SYRINGE
PREFILLED_SYRINGE | INTRAVENOUS | Status: AC
  Filled 2019-11-26: qty 5

## 2019-11-26 MED ORDER — OXYCODONE HCL 5 MG/5ML PO SOLN: 5.0000 mg | Freq: Once | ORAL | Status: DC | PRN

## 2019-11-26 MED ORDER — BUPIVACAINE HCL (PF) 0.25 % IJ SOLN
INTRAMUSCULAR | Status: AC
  Filled 2019-11-26: qty 30

## 2019-11-26 MED ORDER — STERILE WATER FOR IRRIGATION IR SOLN
Status: DC | PRN
  Administered 2019-11-26: 1

## 2019-11-26 MED ORDER — SODIUM CHLORIDE 0.9 % IV SOLN
500.0000 mg | Freq: Once | INTRAVENOUS | Status: AC
  Administered 2019-11-26: 500 mg via INTRAVENOUS

## 2019-11-26 MED ORDER — COCONUT OIL OIL: 1.0000 "application " | TOPICAL_OIL | Status: DC | PRN

## 2019-11-26 MED ORDER — DIPHENHYDRAMINE HCL 25 MG PO CAPS: 25.0000 mg | ORAL_CAPSULE | Freq: Four times a day (QID) | ORAL | Status: DC | PRN

## 2019-11-26 SURGICAL SUPPLY — 34 items
BENZOIN TINCTURE PRP APPL 2/3 (GAUZE/BANDAGES/DRESSINGS) ×3 IMPLANT
CLAMP CORD UMBIL (MISCELLANEOUS) ×3 IMPLANT
CLOSURE STERI STRIP 1/2 X4 (GAUZE/BANDAGES/DRESSINGS) ×2 IMPLANT
CLOSURE WOUND 1/2 X4 (GAUZE/BANDAGES/DRESSINGS) ×1
CLOTH BEACON ORANGE TIMEOUT ST (SAFETY) ×3 IMPLANT
DRSG OPSITE POSTOP 4X10 (GAUZE/BANDAGES/DRESSINGS) ×3 IMPLANT
ELECT REM PT RETURN 9FT ADLT (ELECTROSURGICAL) ×3
ELECTRODE REM PT RTRN 9FT ADLT (ELECTROSURGICAL) ×1 IMPLANT
EXTRACTOR VACUUM M CUP 4 TUBE (SUCTIONS) IMPLANT
EXTRACTOR VACUUM M CUP 4' TUBE (SUCTIONS)
GLOVE BIOGEL PI IND STRL 7.0 (GLOVE) ×3 IMPLANT
GLOVE BIOGEL PI INDICATOR 7.0 (GLOVE) ×6
GLOVE ECLIPSE 7.0 STRL STRAW (GLOVE) ×3 IMPLANT
GOWN STRL REUS W/TWL LRG LVL3 (GOWN DISPOSABLE) ×6 IMPLANT
KIT ABG SYR 3ML LUER SLIP (SYRINGE) ×3 IMPLANT
NEEDLE HYPO 22GX1.5 SAFETY (NEEDLE) ×3 IMPLANT
NEEDLE HYPO 25X5/8 SAFETYGLIDE (NEEDLE) ×3 IMPLANT
NS IRRIG 1000ML POUR BTL (IV SOLUTION) ×3 IMPLANT
PACK C SECTION WH (CUSTOM PROCEDURE TRAY) ×3 IMPLANT
PAD ABD 7.5X8 STRL (GAUZE/BANDAGES/DRESSINGS) ×3 IMPLANT
PAD OB MATERNITY 4.3X12.25 (PERSONAL CARE ITEMS) ×3 IMPLANT
PENCIL SMOKE EVAC W/HOLSTER (ELECTROSURGICAL) ×3 IMPLANT
RTRCTR C-SECT PINK 25CM LRG (MISCELLANEOUS) ×3 IMPLANT
STRIP CLOSURE SKIN 1/2X4 (GAUZE/BANDAGES/DRESSINGS) ×2 IMPLANT
SUT CHROMIC 0 CTX 36 (SUTURE) ×3 IMPLANT
SUT MNCRL 0 VIOLET CTX 36 (SUTURE) ×2 IMPLANT
SUT MONOCRYL 0 CTX 36 (SUTURE) ×6
SUT VIC AB 0 CTX 36 (SUTURE) ×3
SUT VIC AB 0 CTX36XBRD ANBCTRL (SUTURE) ×1 IMPLANT
SUT VIC AB 4-0 KS 27 (SUTURE) ×3 IMPLANT
SYR 30ML LL (SYRINGE) ×3 IMPLANT
TOWEL OR 17X24 6PK STRL BLUE (TOWEL DISPOSABLE) ×3 IMPLANT
TRAY FOLEY W/BAG SLVR 14FR LF (SET/KITS/TRAYS/PACK) ×3 IMPLANT
WATER STERILE IRR 1000ML POUR (IV SOLUTION) ×3 IMPLANT

## 2019-11-26 NOTE — H&P (Signed)
OBSTETRIC ADMISSION HISTORY AND PHYSICAL  Anita Sims is a 24 y.o. female [phone] with IUP at 66w2dby LMP presenting for SOL, she was found to have an active HSV lesion and is now awainting c-section. She reports +FMs, No LOF, no VB, no blurry vision, headaches or peripheral edema, and RUQ pain.  She plans on breast feeding. She request BTL for birth control. She received her prenatal care at [facility] LMP --->  Estimated Date of Delivery: 12/01/19  Sono:  @[redacted]w[redacted]d , CWD, normal anatomy, cephalic presentation, 8762G 42% EFW  Prenatal History/Complications: HSV - active outbreak Marijuana use  Past Medical History: Past Medical History:  Diagnosis Date   Anemia    hx of, but not during pregnancy   Anxiety    "was on medication long time ago"; pt states   [place] 1 disorder ([place]    "was on medication a long time ago" pt states   Chlamydia    Genital herpes    Gonorrhea    [place]stance [place]     Past Surgical History: Past Surgical History:  Procedure Laterality Date   MOUTH SURGERY      Obstetrical History: OB History    Gravida  4   Para  2   Term  2   Preterm  0   AB  1   Living  2     SAB  1   TAB  0   Ectopic  0   Multiple  0   Live Births  2           Social History: Social History   Socioeconomic History   Marital status: Single    Spouse name: Not on file   Number of children: Not on file   Years of education: Not on file   Highest education level: Not on file  Occupational History   Not on file  Tobacco Use   Smoking status: Former Smoker    Types: Cigars   Smokeless tobacco: Never Used   Tobacco comment: Hookah  Vaping Use   Vaping Use: Never used  Substance and Sexual Activity   Alcohol use: Not Currently    Comment: last use 02/12/2019   Drug use: Not Currently    Types: Marijuana    Comment: Last use- 02/12/2019   Sexual activity: Yes    Partners: Male    Birth  control/protection: None  Other Topics Concern   Not on file  Social History Narrative   Not on file   Social Determinants of Health   Financial Resource Strain:    Difficulty of Paying Living Expenses:   Food Insecurity:    Worried About RCharity fundraiserin the Last Year:    RArboriculturistin the Last Year:   Transportation Needs:    LFilm/video editor(Medical):    Lack of Transportation (Non-Medical):   Physical Activity:    Days of Exercise per Week:    Minutes of Exercise per Session:   Stress:    Feeling of Stress :   Social Connections:    Frequency of Communication with Friends and Family:    Frequency of Social Gatherings with Friends and Family:    Attends Religious Services:    Active Member of Clubs or Organizations:    Attends CArchivistMeetings:    Marital Status:     Family History: Family History  Problem Relation Age of Onset   Diabetes Maternal Grandfather  type II   Cancer Maternal Grandfather    Heart disease Maternal Grandmother    Heart disease Mother     Allergies: No Known Allergies  Medications Prior to Admission  Medication Sig Dispense Refill Last Dose   Blood Pressure Monitor KIT 1 Device by Does not apply route once a week. To be monitored Regularly at home. 1 kit 0    Elastic Bandages & Supports (COMFORT FIT MATERNITY SUPP SM) MISC Wear as directed. (Patient not taking: Reported on 10/05/2019) 1 each 0    loratadine (CLARITIN) 10 MG tablet Take 1 tablet (10 mg total) by mouth daily. (Patient not taking: Reported on 10/05/2019) 30 tablet 11    Multiple Vitamin (MULTIVITAMIN) tablet Take 1 tablet by mouth daily. (Patient not taking: Reported on 10/05/2019)      prenatal vitamin w/FE, FA (PRENATAL 1 + 1) 27-1 MG TABS tablet Take 1 tablet by mouth daily before breakfast. (Patient not taking: Reported on 10/19/2019) 90 tablet 3    valACYclovir (VALTREX) 500 MG tablet Take 1 tablet (500 mg total)  by mouth daily. (Patient not taking: Reported on 10/05/2019) 30 tablet 11    valACYclovir (VALTREX) 500 MG tablet Take 1 tablet (500 mg total) by mouth 2 (two) times daily. 60 tablet 6      Review of Systems   All systems reviewed and negative except as stated in HPI  Blood pressure 129/70, pulse 85, temperature 98.6 F (37 C), temperature source Oral, resp. rate 16, height 5' 3"  (1.6 m), weight 84.6 kg, last menstrual period 02/24/2019, SpO2 99 %, not currently breastfeeding. General appearance: alert, cooperative and appears stated age Lungs: breathing comfortably on room air. No respiratory distress Heart: regular rate and rhythm Extremities: no sign of DVT Fetal monitoringBaseline: 145 bpm, Variability: Good {> 6 bpm), Accelerations: Reactive and Decelerations: Absent Uterine activityFrequency: Every 5 minutes Dilation: 7.5 Effacement (%): 80 Station: -1 Exam by:: Erasmo Score RN (RN discussed with Haynes Bast CNM and RN to check paitient due to increased pain)   Prenatal labs: ABO, Rh: --/--/B POS (08/14 1015) Antibody: NEG (08/14 1015) Rubella: 4.00 (02/01 1457) RPR: Non Reactive (05/25 0944)  HBsAg: Negative (02/01 1457)  HIV: Non Reactive (05/25 0944)  GBS: Negative/-- (07/21 1120)  1 hr Glucola normal Genetic screening  declined Anatomy US normal  Prenatal Transfer Tool  Maternal Diabetes: No Genetic Screening: Declined Maternal Ultrasounds/Referrals: Normal Fetal Ultrasounds or other Referrals:  None Maternal Substance Abuse:  Yes:  Type: Marijuana Significant Maternal Medications:  None Significant Maternal Lab Results: Group B Strep negative  Results for orders placed or performed during the hospital encounter of 11/26/19 (from the past 24 hour(s))  POCT fern test   Collection Time: 11/26/19  9:51 AM  Result Value Ref Range   POCT Fern Test Positive = ruptured amniotic membanes   SARS Coronavirus 2 by RT PCR (hospital order, performed in [place]  hospital lab) Nasopharyngeal Nasopharyngeal Swab   Collection Time: 11/26/19 10:06 AM   Specimen: Nasopharyngeal Swab  Result Value Ref Range   SARS Coronavirus 2 NEGATIVE NEGATIVE  CBC   Collection Time: 11/26/19 10:15 AM  Result Value Ref Range   WBC 9.4 4.0 - 10.5 K/uL   RBC 3.99 3.87 - 5.11 MIL/uL   Hemoglobin 10.8 (L) 12.0 - 15.0 g/dL   HCT 34.0 (L) 36 - 46 %   MCV 85.2 80.0 - 100.0 fL   MCH 27.1 26.0 - 34.0 pg   MCHC 31.8 30.0 - 36.0  g/dL   RDW 14.3 11.5 - 15.5 %   Platelets 218 150 - 400 K/uL   nRBC 0.0 0.0 - 0.2 %  Type and screen [place]   Collection Time: 11/26/19 10:15 AM  Result Value Ref Range   ABO/RH(D) B POS    Antibody Screen NEG    Sample Expiration      11/29/2019,2359 Performed at [facility], [place] [address] [place], [place] [address]     Patient Active Problem List   Diagnosis Date Noted   Unwanted fertility 10/19/2019   Supervision of normal pregnancy 05/16/2019   Marijuana user 05/16/2019   Herpes simplex type 2 (HSV-2) infection affecting pregnancy, antepartum 05/16/2019   Bacterial vaginitis 04/06/2019   Miscarriage within last 12 months 03/14/2019   Polysubstance abuse ([place]) 09/01/2017    Assessment/Plan:  EMIKO OSORTO is a 24 y.o. [id number] at 11w2dhere for c-section planned due to active HSV lesions of the pelvis.  #Labor: C-section planned. Will stand by for vaginal delivery if she delivers precipitously.  #FWB: Cat I #ID:  GBS neg #HSV: active outbreak, planning c-section #MOF: Breast #MOC:BTL #Circ:  yes  PMatilde Haymaker MD  11/26/2019, 12:06 PM

## 2019-11-26 NOTE — MAU Note (Signed)
Anita Sims is a 24 y.o. at [redacted]w[redacted]d here in MAU reporting: LOF since about 80. States had a couple small gushes and then a big one that soaked through her clothes. Fluid is clear with some white. No bleeding. Having some mild contractions. +FM  Onset of complaint: today  Pain score: 3/10  Vitals:   11/26/19 0926  BP: 129/70  Pulse: 85  Resp: 16  Temp: 98.6 F (37 C)  SpO2: 99%     FHT: +FM  Lab orders placed from triage: none

## 2019-11-26 NOTE — H&P (Signed)
Anita Sims is an 24 y.o. [phone] 62w2dfemale.   Chief Complaint: ROM HPI: Pt. With ROM this am at 840 am.  HSV outbreak one week ago. Not taking her Valtrex until outbreak started. Things are healing over, but lesions are still present.  Past Medical History:  Diagnosis Date  . Anemia    hx of, but not during pregnancy  . Anxiety    "was on medication long time ago"; pt states  . [place]ipolar 1 disorder ([place]    "was on medication a long time ago" pt states  . Chlamydia   . Genital herpes   . Gonorrhea   . Substance abuse ([facility]     Past Surgical History:  Procedure Laterality Date  . MOUTH SURGERY      Family History  Problem Relation Age of Onset  . Diabetes Maternal Grandfather        type II  . Cancer Maternal Grandfather   . Heart disease Maternal Grandmother   . Heart disease Mother    Social History:  reports that she has quit smoking. Her smoking use included cigars. She has never used smokeless tobacco. She reports previous alcohol use. She reports previous drug use. Drug: Marijuana.   No Known Allergies  Medications Prior to Admission  Medication Sig Dispense Refill  . Blood Pressure Monitor KIT 1 Device by Does not apply route once a week. To be monitored Regularly at home. 1 kit 0  . Elastic Bandages & Supports (COMFORT FIT MATERNITY SUPP SM) MISC Wear as directed. (Patient not taking: Reported on 10/05/2019) 1 each 0  . loratadine (CLARITIN) 10 MG tablet Take 1 tablet (10 mg total) by mouth daily. (Patient not taking: Reported on 10/05/2019) 30 tablet 11  . Multiple Vitamin (MULTIVITAMIN) tablet Take 1 tablet by mouth daily. (Patient not taking: Reported on 10/05/2019)    . prenatal vitamin w/FE, FA (PRENATAL 1 + 1) 27-1 MG TABS tablet Take 1 tablet by mouth daily before breakfast. (Patient not taking: Reported on 10/19/2019) 90 tablet 3  . valACYclovir (VALTREX) 500 MG tablet Take 1 tablet (500 mg total) by mouth daily. (Patient not taking: Reported on  10/05/2019) 30 tablet 11  . valACYclovir (VALTREX) 500 MG tablet Take 1 tablet (500 mg total) by mouth 2 (two) times daily. 60 tablet 6     A comprehensive review of systems was negative.  Blood pressure 129/70, pulse 85, temperature 98.6 F (37 C), temperature source Oral, resp. rate 16, height 5' 3"  (1.6 m), weight 84.6 kg, last menstrual period 02/24/2019, SpO2 99 %, not currently breastfeeding. General appearance: alert, cooperative and appears stated age Head: Normocephalic, without obvious abnormality, atraumatic Neck: supple, symmetrical, trachea midline Lungs: normal effort Heart: regular rate and rhythm Abdomen: gravid, non-tender Extremities: Homans sign is negative, no sign of DVT Skin: Skin color, texture, turgor normal. No rashes or lesions Neurologic: Grossly normal   Lab Results  Component Value Date   WBC 9.6 09/06/2019   HGB 11.3 09/06/2019   HCT 34.7 09/06/2019   MCV 89 09/06/2019   PLT 220 09/06/2019         ABO, Rh: B/Positive/-- (02/01 1457)  Antibody: Negative (02/01 1457)  Rubella: 4.00 (02/01 1457)  RPR: Non Reactive (05/25 0944)  HBsAg: Negative (02/01 1457)  HIV: Non Reactive (05/25 0944)  GBS: Negative/-- (07/21 1120)     Assessment/Plan Active Problems:   Herpes simplex type 2 (HSV-2) infection affecting pregnancy, antepartum  For PLTCS. Unsure about contraception at this  time. Risks include but are not limited to bleeding, infection, injury to surrounding structures, including bowel, bladder and ureters, blood clots, and death.  Likelihood of success is high.   Donnamae Jude 11/26/2019, 10:24 AM

## 2019-11-26 NOTE — Op Note (Signed)
Operative Note   SURGERY DATE: 11/26/2019  PRE-OP DIAGNOSIS:  *Pregnancy at 39/2 *Current genital herpes outbreak *Active labor with spontaneous rupture of membranes  POST-OP DIAGNOSIS: Same. Delivered   PROCEDURE: primary low transverse cesarean section via pfannenstiel skin incision with double layer uterine closure and bilateral tubal ligation ([facility] Method)  SURGEON: Surgeon(s) and Role:    * Andrews Bing, MD - Primary  ASSISTANT: Casper Harrison, MD (OB Fellow)  ANESTHESIA: spinal  ESTIMATED BLOOD LOSS:  DRAINS: UOP via indwelling foley  TOTAL IV FLUIDS: crystalloid  VTE PROPHYLAXIS: SCDs to bilateral lower extremities  ANTIBIOTICS: Two grams of Cefazolin were given., within 1 hour of skin incision and azithromycin 500mg  IV x 1 given intra operatively  SPECIMENS: placenta to pathology  COMPLICATIONS: slight extension of right hysterotomy  FINDINGS: At least one HSV lesion was noted at the right peri-urethral area.  No intra-abdominal adhesions were noted. Grossly normal uterus, tubes and ovaries. clear amniotic fluid, cephalic, female infant, weight , APGARs 8/9, intact placenta.  PROCEDURE IN DETAIL: The patient was taken to the operating room where anesthesia was administered and normal fetal heart tones were confirmed. She was then prepped and draped in the normal fashion in the dorsal supine position with a leftward tilt.  After a time out was performed, a pfannensteil skin incision was made with the scalpel and carried through to the underlying layer of fascia. The fascia was then incised at the midline and this incision was extended laterally with the mayo scissors. Attention was turned to the superior aspect of the fascial incision which was grasped with the kocher clamps x 2, tented up and the rectus muscles were dissected off with the bovie. In a similar fashion the inferior aspect of the fascial incision was grasped with the kocher  clamps, tented up and the rectus muscles dissected off with the mayo scissors. The rectus muscles were then separated in the midline and the peritoneum was entered bluntly. The bladder blade was inserted and the vesicouterine peritoneum was identified, tented up and entered with the metzenbaum scissors. This incision was extended laterally and the bladder flap was created digitally. The bladder blade was reinserted.  A low transverse hysterotomy was made with the scalpel until the endometrial cavity was breached and the amniotic sac ruptured, yielding clear amniotic fluid. This incision was extended bluntly and the infant's head, shoulders and body were delivered atraumatically.The cord was clamped x 2 and cut, and the infant was handed to the awaiting pediatricians, after delayed cord clamping was done.  The placenta was then gradually expressed from the uterus and then the uterus was exteriorized and cleared of all clots and debris. The hysterotomy was repaired with a running suture of 0-chromic due to the extension. A second imbricating layer of 1-0 monocryl suture was then placed to achieve excellent hemostasis.   The left Fallopian tube was identified by tracing out to the fimbraie, grasped with the Babcock clamps. A large avascular midsection of the tube approximately 3-4cm from the cornua was grasped with the babcock clamps and the distal and proximal aspects were ligated with a suture of 2-0 vicryl, with the intervening portion of tube was transected and removed, via the Metzenbaum scissors. Another suture tie was then placed below both stumps.  Attention was then turned to the right fallopian tube after confirmation by tracing the tube out to the fimbriae. The same procedure was then performed on the right Fallopian tube, with excellent hemostasis was noted from both  BTL sites.   The uterus and adnexa were then returned to the abdomen, and the hysterotomy and all operative sites were reinspected and  excellent hemostasis was noted after irrigation and suction of the abdomen with warm saline, including the BTL sites  The peritoneum was closed with a running stitch of 3-0 Vicryl. The fascia was reapproximated with 0 Vicryl in a simple running fashion bilaterally. The subcutaneous layer was then reapproximated with interrupted sutures of 2-0 plain gut, and the skin was then closed with 4-0 monocryl, in a subcuticular fashion.  The patient  tolerated the procedure well. Sponge, lap, needle, and instrument counts were correct x 2. The patient was transferred to the recovery room awake, alert and breathing independently in stable condition.  Cornelia Copa MD Attending Center for [facility])

## 2019-11-26 NOTE — Progress Notes (Signed)
S: Ms. Anita Sims is a 24 y.o. [id number] at [redacted]w[redacted]d  who presents to MAU today complaining of leaking of fluid since 40. She denies vaginal bleeding. She denies contractions. She reports normal fetal movement.    O: BP 129/70 (BP Location: Right Arm)   Pulse 85   Temp 98.6 F (37 C) (Oral)   Resp 16   Ht 5\' 3"  (1.6 m)   Wt 84.6 kg   LMP 02/24/2019   SpO2 99% Comment: room air  BMI 33.02 kg/m  GENERAL: Well-developed, well-nourished female in no acute distress.  HEAD: Normocephalic, atraumatic.  CHEST: Normal effort of breathing, regular heart rate ABDOMEN: Soft, nontender, gravid  Fetal Monitoring: Baseline: 140 Variability: moderate Accelerations: 10x10 Decelerations: none Contractions: none  Results for orders placed or performed during the hospital encounter of 11/26/19 (from the past 24 hour(s))  POCT fern test     Status: None   Collection Time: 11/26/19  9:51 AM  Result Value Ref Range   POCT Fern Test Positive = ruptured amniotic membanes    Pt informed that the ultrasound is considered a limited OB ultrasound and is not intended to be a complete ultrasound exam.  Patient also informed that the ultrasound is not being completed with the intent of assessing for fetal or placental anomalies or any pelvic abnormalities.  Explained that the purpose of today's ultrasound is to assess for  presentation. Vertex presentation confirmed. Patient acknowledges the purpose of the exam and the limitations of the study.     Patient reports HSV outbreak that started on 8/7. She reports not taking her suppression prior to the outbreak starting. She reports she felt like she had one place on her right labia. On SSE, healed labial site noted and one healing lesion noted under urethra. None in vagina or on cervix. Dr. 10/7 notified of patient arrival in MAU and will come see patient.   A: SIUP at [redacted]w[redacted]d  SROM  P: Admit patient for delivery.    [redacted]w[redacted]d,  CNM 11/26/19 9:56 AM

## 2019-11-26 NOTE — Progress Notes (Signed)
OB Note HSV outbreak and still with active lesion. S/p terbutaline, cx still at 7-8. D/w her recommend primary c-section for this and pt amenable to plan. Pt would like BTL. D/w her re: permanency of this and pt desires this. Papers UTD. Ancef and azithro written for. Can proceed when OR is ready   Cornelia Copa MD Attending Center for [employer] (Faculty Practice) 11/26/2019 Time: 1214pm

## 2019-11-26 NOTE — Anesthesia Postprocedure Evaluation (Signed)
Anesthesia Post Note  Patient: Anita Sims  Procedure(s) Performed: CESAREAN SECTION     Patient location during evaluation: PACU Anesthesia Type: Spinal Level of consciousness: awake and alert Pain management: pain level controlled Vital Signs Assessment: post-procedure vital signs reviewed and stable Respiratory status: spontaneous breathing and respiratory function stable Cardiovascular status: blood pressure returned to baseline and stable Postop Assessment: spinal receding and no apparent nausea or vomiting Anesthetic complications: no   No complications documented.  Last Vitals:  Vitals:   11/26/19 1450 11/26/19 1500  BP:  107/65  Pulse: 60 60  Resp: 15 17  Temp:    SpO2:      Last Pain:  Vitals:   11/26/19 1500  TempSrc:   PainSc: 7    Pain Goal:                Epidural/Spinal Function Cutaneous sensation: Able to Discern Pressure (11/26/19 1430), Patient able to flex knees: No (11/26/19 1430), Patient able to lift hips off bed: No (11/26/19 1430), Back pain beyond tenderness at insertion site: No (11/26/19 1430), Progressively worsening motor and/or sensory loss: No (11/26/19 1430)  Beryle Lathe

## 2019-11-26 NOTE — Discharge Summary (Signed)
error 

## 2019-11-26 NOTE — Transfer of Care (Signed)
Immediate Anesthesia Transfer of Care Note  Patient: Anita Sims  Procedure(s) Performed: CESAREAN SECTION  Patient Location: PACU  Anesthesia Type:Spinal  Level of Consciousness: awake, alert  and oriented  Airway & Oxygen Therapy: Patient Spontanous Breathing  Post-op Assessment: Report given to RN and Post -op Vital signs reviewed and stable  Post vital signs: Reviewed and stable  Last Vitals:  Vitals Value Taken Time  BP 110/67 11/26/19 1415  Temp 36.6 C 11/26/19 1400  Pulse 68 11/26/19 1419  Resp 16 11/26/19 1419  SpO2 100 % 11/26/19 1419  Vitals shown include unvalidated device data.  Last Pain:  Vitals:   11/26/19 1400  TempSrc: Oral  PainSc:          Complications: No complications documented.

## 2019-11-26 NOTE — Anesthesia Procedure Notes (Signed)
Spinal  Patient location during procedure: OR Start time: 11/26/2019 12:26 PM End time: 11/26/2019 12:29 PM Staffing Performed: anesthesiologist  Anesthesiologist: Beryle Lathe, MD Preanesthetic Checklist Completed: patient identified, IV checked, risks and benefits discussed, surgical consent, monitors and equipment checked, pre-op evaluation and timeout performed Spinal Block Patient position: sitting Prep: DuraPrep Patient monitoring: heart rate, cardiac monitor, continuous pulse ox and blood pressure Approach: midline Location: L3-4 Injection technique: single-shot Needle Needle type: Pencan  Needle gauge: 24 G Additional Notes Consent was obtained prior to the procedure with all questions answered and concerns addressed. Risks including, but not limited to, bleeding, infection, nerve damage, paralysis, failed block, inadequate analgesia, allergic reaction, high spinal, itching, and headache were discussed and the patient wished to proceed. Functioning IV was confirmed and monitors were applied. Sterile prep and drape, including hand hygiene, mask, and sterile gloves were used. The patient was positioned and the spine was prepped. The skin was anesthetized with lidocaine. Free flow of clear CSF was obtained prior to injecting local anesthetic into the CSF. The spinal needle aspirated freely following injection. The needle was carefully withdrawn. The patient tolerated the procedure well.   Leslye Peer, MD

## 2019-11-26 NOTE — Anesthesia Preprocedure Evaluation (Addendum)
Anesthesia Evaluation  Patient identified by MRN, date of birth, ID band Patient awake    Reviewed: Allergy & Precautions, NPO status , Patient's Chart, lab work & pertinent test results  History of Anesthesia Complications Negative for: history of anesthetic complications  Airway Mallampati: II  TM Distance: >3 FB Neck ROM: Full    Dental  (+) Dental Advisory Given   Pulmonary former smoker,    Pulmonary exam normal        Cardiovascular negative cardio ROS Normal cardiovascular exam     Neuro/Psych PSYCHIATRIC DISORDERS Anxiety Bipolar Disorder negative neurological ROS     GI/Hepatic negative GI ROS, (+)     substance abuse  ,   Endo/Other   Obesity   Renal/GU negative Renal ROS     Musculoskeletal negative musculoskeletal ROS (+)   Abdominal   Peds  Hematology  (+) anemia ,  Plt 218k    Anesthesia Other Findings HSV, Chlamydia, Gonorrhea Covid test negative  Reproductive/Obstetrics (+) Pregnancy                            Anesthesia Physical Anesthesia Plan  ASA: II  Anesthesia Plan: Spinal   Post-op Pain Management:    Induction:   PONV Risk Score and Plan: 2 and Treatment may vary due to age or medical condition, Ondansetron and Scopolamine patch - Pre-op  Airway Management Planned: Natural Airway  Additional Equipment: None  Intra-op Plan:   Post-operative Plan:   Informed Consent: I have reviewed the patients History and Physical, chart, labs and discussed the procedure including the risks, benefits and alternatives for the proposed anesthesia with the patient or authorized representative who has indicated his/her understanding and acceptance.       Plan Discussed with: CRNA and Anesthesiologist  Anesthesia Plan Comments: (Labs reviewed, platelets acceptable. Discussed risks and benefits of spinal, including spinal/epidural hematoma, infection, failed  block, and PDPH. Patient expressed understanding and wished to proceed. )       Anesthesia Quick Evaluation

## 2019-11-27 LAB — CBC
HCT: 29.3 % — ABNORMAL LOW (ref 36.0–46.0)
Hemoglobin: 9.1 g/dL — ABNORMAL LOW (ref 12.0–15.0)
MCH: 26.9 pg (ref 26.0–34.0)
MCHC: 31.1 g/dL (ref 30.0–36.0)
MCV: 86.7 fL (ref 80.0–100.0)
Platelets: 206 10*3/uL (ref 150–400)
RBC: 3.38 MIL/uL — ABNORMAL LOW (ref 3.87–5.11)
RDW: 14.4 % (ref 11.5–15.5)
WBC: 12.1 10*3/uL — ABNORMAL HIGH (ref 4.0–10.5)
nRBC: 0 % (ref 0.0–0.2)

## 2019-11-27 LAB — CREATININE, SERUM
Creatinine, Ser: 0.78 mg/dL (ref 0.44–1.00)
GFR calc Af Amer: >60 mL/min (ref >60)
GFR calc non Af Amer: >60 mL/min (ref >60)

## 2019-11-27 MED ORDER — IBUPROFEN 800 MG PO TABS
800.0000 mg | ORAL_TABLET | Freq: Four times a day (QID) | ORAL | Status: DC | PRN
  Administered 2019-11-27 – 2019-11-28 (×2): 800 mg via ORAL
  Filled 2019-11-27 (×3): qty 1

## 2019-11-27 MED ORDER — OXYCODONE HCL 5 MG PO TABS
5.0000 mg | ORAL_TABLET | ORAL | Status: DC | PRN
  Administered 2019-11-27 – 2019-11-28 (×3): 10 mg via ORAL
  Filled 2019-11-27 (×3): qty 2

## 2019-11-27 NOTE — Progress Notes (Signed)
Subjective: Postpartum Day 1: Cesarean Delivery Patient reports incisional pain, tolerating PO, + flatus and no problems voiding.    Objective: Vital signs in last 24 hours: Temp:  [97.6 F (36.4 C)-98.2 F (36.8 C)] 98 F (36.7 C) (08/15 0501) Pulse Rate:  [59-63] 63 (08/15 0501) Resp:  [17-18] 18 (08/15 0501) BP: (105-108)/(56-58) 108/56 (08/15 0501) SpO2:  [99 %-100 %] 99 % (08/15 0501)  Physical Exam:  General: alert, cooperative and no distress Lochia: appropriate Uterine Fundus: firm Incision: no significant drainage DVT Evaluation: No evidence of DVT seen on physical exam.  Recent Labs    11/26/19 1015 11/27/19 0618  HGB 10.8* 9.1*  HCT 34.0* 29.3*    Assessment/Plan: Status post Cesarean section. Doing well postoperatively.  Continue current care.  Sharen Counter 11/27/2019, 4:22 PM

## 2019-11-28 ENCOUNTER — Encounter (HOSPITAL_COMMUNITY): Payer: Self-pay | Admitting: Obstetrics and Gynecology

## 2019-11-28 MED ORDER — ACETAMINOPHEN 500 MG PO TABS: 1000.0000 mg | ORAL_TABLET | Freq: Four times a day (QID) | ORAL | 0 refills | Status: DC | PRN

## 2019-11-28 MED ORDER — OXYCODONE HCL 5 MG PO TABS: 5.0000 mg | ORAL_TABLET | ORAL | 0 refills | Status: DC | PRN

## 2019-11-28 MED ORDER — IBUPROFEN 800 MG PO TABS: 800.0000 mg | ORAL_TABLET | Freq: Four times a day (QID) | ORAL | 0 refills | Status: DC | PRN

## 2019-11-28 NOTE — Clinical Social Work Maternal (Signed)
CLINICAL SOCIAL WORK MATERNAL/CHILD NOTE  Patient Details  Name: Anita Sims MRN: [id number] Date of Birth: 1995-08-13  Date:  11/28/2019  Clinical Social Worker Initiating Note:  Laurey Arrow Date/Time: Initiated:  11/28/19/1430     Child's Name:  Harlow Asa   Biological Parents:  Mother, Father (FOB is Antron McDuffie (08/14/93).)   Need for Interpreter:  None   Reason for Referral:  Behavioral Health Concerns, Current Substance Use/Substance Use During Pregnancy  (hx of THC use adn hx of anxiety and bipolar disorder)   Address:  [address] [place] [address]    Phone number:  [phone] (home)     Additional phone number:  Household Members/Support Persons (HM/SP):   Household Member/Support Person 1, Household Member/Support Person 2   HM/SP Name Relationship DOB or Age  HM/SP -1 Belva Crome McDuffie daughter 09/01/2017  HM/SP -2 Aryan McDuffie son 11/16/2018  HM/SP -3        HM/SP -4        HM/SP -5        HM/SP -6        HM/SP -7        HM/SP -8          Natural Supports (not living in the home):  Parent, Immediate Family   Professional Supports: None   Employment: Part-time   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   Education:  High school graduate   Homebound arranged:    Financial Resources:  Medicaid   Other Resources:  Physicist, medical , [facility]   Cultural/Religious Considerations Which May Impact Care:  None reported  Strengths:  Ability to meet basic needs , Home prepared for child , Understanding of illness, Pediatrician chosen   Psychotropic Medications:         Pediatrician:    Careers adviser area  Pediatrician List:   Ecologist Other ([place])  [place]      Pediatrician Fax Number:    Risk Factors/Current Problems:  Mental Health Concerns , Substance Use    Cognitive State:  Able to Concentrate , Alert ,  Linear Thinking    Mood/Affect:  Comfortable , Relaxed , Irritable , Agitated    CSW Assessment: CSW met with MOB in room 414 to complete an assessment for MH hx (anxiety and bipolar dx) and, substance use. MOB was open to meeting with CSW however MOB appeared irritated and demonstrate little to no eye contact with CSW.  When CSW arrived, MOB was packing her items in preparation for discharge, infant was asleep his bassinet, and a unknown women was resting on the couch.  CSW explained CSW's role and with MOB's permission, CSW asked MOB's guest to leave in order to assess MOB in private.  CSW inquired about MOB's substance use in pregnancy. MOB did not deny or acknowledged the use of any substance use during her pregnancy. MOB refused to report her last use of THC however denied the use of all other illicit substances.   CSW shared with MOB the hospital's drug screen policy, and informed MOB of the 2 screenings for the infant.  CSW reported to MOB that the infant's UDS was negative, and CSW will follow the infant's cord screen.  MOB was made aware, that if infant's cord screen is positive for any substance without an explanation, CSW would make a report to [facility]  CPS. MOB did not have any questions or concerns at this time. CSW offered MOB SA resources and MOB declined. CSW inquired about MOB's MH, and MOB acknowledged her hx of anxiety however denied the bipolar dx.  MOB stated that she is currently not on medication, and denied resources for outpatient counseling and PMADs. CSW educated MOB about PPD. CSW informed MOB of possible supports and interventions to decrease PPD.  CSW also encouraged MOB to seek medical attention if needed for increased signs and symptoms for PPD. CSW thanked MOB for meeting with CSW, and MOB did not have any questions or concerns at this time. MOB also reported that she all essential items for infant and feels prepared to parent post discharge.   There are no  barriers to discharge.   CSW Plan/Description:  Psychosocial Support and Ongoing Assessment of Needs, Sudden Infant Death Syndrome (SIDS) Education, Perinatal Mood and Anxiety Disorder (PMADs) Education, Other Patient/Family Education, [place], Other Information/Referral to [employer], CSW Will Continue to Monitor Umbilical Cord Tissue Drug Screen Results and Make Report if Warranted   Laurey Arrow, MSW, LCSW Clinical Social Work [phone]  Dimple Nanas, [place] 11/28/2019, 3:07 PM

## 2019-11-28 NOTE — Progress Notes (Signed)
POSTPARTUM PROGRESS NOTE  POD#2  Subjective:  Anita Sims is a 24 y.o. [phone] s/p pLTCS with BTL at [redacted]w[redacted]d.  She reports she is doing well. No acute events overnight. She denies any problems with ambulating, voiding or po intake. Denies nausea or vomiting.  Pain is moderately controlled.  Lochia is appropriate.  Objective: Blood pressure 110/74, pulse 62, temperature (!) 97.4 F (36.3 C), temperature source Oral, resp. rate 18, height 5\' 3"  (1.6 m), weight 84.6 kg, last menstrual period 02/24/2019, SpO2 100 %, unknown if currently breastfeeding.  Physical Exam:  General: alert, cooperative and no distress Chest: no respiratory distress Heart:regular rate, distal pulses intact Abdomen: soft, nontender,  Uterine Fundus: firm, appropriately tender Incision: honeycomb dressing in place, no drainage noted, no surrounding erythema DVT Evaluation: No calf swelling or tenderness Extremities: no edema Skin: warm, dry  Recent Labs    11/26/19 1015 11/27/19 0618  HGB 10.8* 9.1*  HCT 34.0* 29.3*    Assessment/Plan: Anita Sims is a 24 y.o. 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   LOS: 2 days   06/2019, MD OB Fellow  11/28/2019, 6:40 AM

## 2019-11-29 LAB — SURGICAL PATHOLOGY

## 2019-12-05 ENCOUNTER — Ambulatory Visit: Payer: BC Managed Care – PPO

## 2019-12-12 ENCOUNTER — Ambulatory Visit: Payer: BC Managed Care – PPO

## 2019-12-14 ENCOUNTER — Ambulatory Visit: Payer: BC Managed Care – PPO

## 2019-12-14 ENCOUNTER — Other Ambulatory Visit: Payer: Self-pay

## 2019-12-14 DIAGNOSIS — R309 Painful micturition, unspecified: Secondary | ICD-10-CM

## 2019-12-14 NOTE — Progress Notes (Signed)
Pt is in the office for possible UTI, pt reports burning pain/pressure with urination. Pt left a urine sample, will follow up with results.

## 2019-12-14 NOTE — Progress Notes (Signed)
I have reviewed this chart and agree with the RN/CMA assessment and management.    K. Meryl Dekisha Mesmer, M.D. Attending [facility] Practice)   

## 2019-12-16 LAB — URINE CULTURE

## 2019-12-20 ENCOUNTER — Ambulatory Visit (INDEPENDENT_AMBULATORY_CARE_PROVIDER_SITE_OTHER): Payer: BC Managed Care – PPO | Admitting: Obstetrics and Gynecology

## 2019-12-20 ENCOUNTER — Encounter: Payer: Self-pay | Admitting: Obstetrics and Gynecology

## 2019-12-20 ENCOUNTER — Other Ambulatory Visit: Payer: Self-pay

## 2019-12-20 NOTE — Patient Instructions (Signed)

## 2019-12-20 NOTE — Progress Notes (Signed)
Post Partum Visit Note  Anita Sims is a 24 y.o. [phone] female who presents for a postpartum visit. She is 3.5 weeks  postpartum following a primary Cesarean delivery for HSV Outbreak with SROM.  I have fully reviewed the prenatal and intrapartum course. The delivery was at  39w 2d gestational weeks.  Anesthesia: spinal. Postpartum course has been unremarkable. She reports that she is a single parent and needs to return to work ASAP. 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 No patient transferring required). Baby is doing well. Baby is feeding by both breast and bottle - Gerber Gentle. Bleeding no bleeding. Bowel function is normal. Bladder function is normal. Patient is not sexually active. Contraception method is tubal ligation. Postpartum depression screening: negative.   The pregnancy intention screening data noted above was reviewed. Potential methods of contraception were discussed. The patient elected to proceed with Female Sterilization.    Edinburgh Postnatal Depression Scale - 12/20/19 1356      Edinburgh Postnatal Depression Scale:  In the Past 7 Days   I have been able to laugh and see the funny side of things. 0    I have looked forward with enjoyment to things. 0    I have blamed myself unnecessarily when things went wrong. 0    I have been anxious or worried for no good reason. 0    I have felt scared or panicky for no good reason. 0    Things have been getting on top of me. 0    I have been so unhappy that I have had difficulty sleeping. 0    I have felt sad or miserable. 0    I have been so unhappy that I have been crying. 0    The thought of harming myself has occurred to me. 0    Edinburgh Postnatal Depression Scale Total 0          The following portions of the patient's history were reviewed and updated as appropriate: allergies, current medications, past family history, past medical history, past social history, past  surgical history and problem list.  Review of Systems Constitutional: negative Eyes: negative Ears, nose, mouth, throat, and face: negative Respiratory: negative Cardiovascular: negative Gastrointestinal: negative Genitourinary:negative Integument/breast: negative Hematologic/lymphatic: negative Musculoskeletal:negative Neurological: negative Behavioral/Psych: negative Endocrine: negative Allergic/Immunologic: negative    Objective:  Blood pressure 109/71, pulse 61, weight 162 lb (73.5 kg), last menstrual period 02/24/2019, unknown if currently breastfeeding.  General:  alert, cooperative, appears stated age and no distress   Breasts:  inspection negative, no nipple discharge or bleeding, no masses or nodularity palpable  Lungs: clear to auscultation bilaterally  Heart:  regular rate and rhythm, S1, S2 normal, no murmur, click, rub or gallop  Abdomen: soft, non-tender; bowel sounds normal; no masses,  no organomegaly   Vulva:  not evaluated  Vagina: not evaluated  Cervix:  not evaluated  Corpus: not examined  Adnexa:  not evaluated  Rectal Exam: Not performed.        Assessment:  Encounter for postpartum visit Normal postpartum exam. Pap smear not done at today's visit.   Plan:   Essential components of care per ACOG recommendations:  1.  Mood and well being: Patient with negative depression screening today. Reviewed local resources for support.  - Patient does not use tobacco.  - hx of drug use? Yes  If yes, discussed support systems. Patient denies using THC currently. She denies ever using cocaine.  2. Infant care and feeding:  -Patient currently breastmilk feeding? No If breastmilk feeding discussed return to work and pumping. If needed, patient was provided letter for work to allow for every 2-3 hr pumping breaks, and to be granted a private location to express breastmilk and refrigerated area to store breastmilk. Reviewed importance of draining breast regularly to  support lactation. -Social determinants of health (SDOH) reviewed in EPIC. No concerns  3. Sexuality, contraception and birth spacing - Patient does not want a pregnancy in the next year.  Desired family size is 3 children.  - Reviewed forms of contraception in tiered fashion. Patient desired bilateral tubal ligation today.   - Discussed birth spacing of 18 months  4. Sleep and fatigue -Encouraged family/partner/community support of 4 hrs of uninterrupted sleep to help with mood and fatigue  5. Physical Recovery  - Discussed patients delivery and complications - Patient had a primary cesarean - Patient has urinary incontinence? No  - Patient is safe to resume physical and sexual activity after 6 weeks. Expressed that returning to work too soon can have adverse effects on her healing process. Patient willing to go back to work after 4 weeks PP.  6.  Health Maintenance - Last pap smear done 05/16/2019 and was normal with negative HPV. Mammogram n/a  7. NoChronic Disease - PCP follow up  Raelyn Mora, CNM [facility] [employer], [facility]

## 2020-02-06 ENCOUNTER — Encounter: Payer: Self-pay | Admitting: Advanced Practice Midwife

## 2020-02-06 ENCOUNTER — Ambulatory Visit (INDEPENDENT_AMBULATORY_CARE_PROVIDER_SITE_OTHER): Payer: BC Managed Care – PPO | Admitting: Advanced Practice Midwife

## 2020-02-06 ENCOUNTER — Other Ambulatory Visit (HOSPITAL_COMMUNITY)
Admission: RE | Admit: 2020-02-06 | Discharge: 2020-02-06 | Disposition: A | Discharge status: Home / Self Care | Payer: BC Managed Care – PPO | Source: Ambulatory Visit | Attending: Advanced Practice Midwife | Admitting: Advanced Practice Midwife

## 2020-02-06 ENCOUNTER — Other Ambulatory Visit: Payer: Self-pay

## 2020-02-06 VITALS — BP 118/81 | HR 105 | Wt 160.0 lb

## 2020-02-06 DIAGNOSIS — B9689 Other specified bacterial agents as the cause of diseases classified elsewhere: Secondary | ICD-10-CM | POA: Diagnosis not present

## 2020-02-06 DIAGNOSIS — A599 Trichomoniasis, unspecified: Secondary | ICD-10-CM

## 2020-02-06 DIAGNOSIS — Z113 Encounter for screening for infections with a predominantly sexual mode of transmission: Secondary | ICD-10-CM

## 2020-02-06 DIAGNOSIS — N760 Acute vaginitis: Secondary | ICD-10-CM | POA: Diagnosis not present

## 2020-02-06 NOTE — Progress Notes (Addendum)
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  Denies pain, fever, chills, NV.

## 2020-02-06 NOTE — Patient Instructions (Signed)

## 2020-02-06 NOTE — Progress Notes (Signed)
  GYNECOLOGY PROGRESS NOTE  History:  24 y.o. [id number] presents to [facility] office today for problem gyn visit. She reports some vaginal discharge with itching and desires STD screening.  She denies h/a, dizziness, shortness of breath, n/v, or fever/chills.    The following portions of the patient's history were reviewed and updated as appropriate: allergies, current medications, past family history, past medical history, past social history, past surgical history and problem list. Last pap smear on 05/16/19 was normal  Review of Systems:  Pertinent items are noted in HPI.   Objective:  Physical Exam Last menstrual period 02/24/2019, unknown if currently breastfeeding. VS reviewed, nursing note reviewed,  Constitutional: well developed, well nourished, no distress HEENT: normocephalic CV: normal rate Pulm/chest wall: normal effort Breast Exam: deferred Abdomen: soft Neuro: alert and oriented x 3 Skin: warm, dry Psych: affect normal Pelvic exam: Deferred, pt self swabbed for vaginal cultures.  Assessment & Plan:  1. Screening examination for STD (sexually transmitted disease) --Cultures pending  Sharen Counter, CNM 8:35 AM

## 2020-02-07 LAB — CERVICOVAGINAL ANCILLARY ONLY
Bacterial Vaginitis (gardnerella): POSITIVE — AB
Candida Glabrata: NEGATIVE
Candida Vaginitis: NEGATIVE
Chlamydia: NEGATIVE
Comment: NEGATIVE
Comment: NEGATIVE
Comment: NEGATIVE
Comment: NEGATIVE
Comment: NEGATIVE
Comment: NORMAL
Neisseria Gonorrhea: NEGATIVE
Trichomonas: POSITIVE — AB

## 2020-02-07 LAB — RPR: RPR Ser Ql: NONREACTIVE

## 2020-02-07 LAB — HEPATITIS C ANTIBODY: Hep C Virus Ab: 0.1 s/co ratio (ref 0.0–0.9)

## 2020-02-07 LAB — HIV ANTIBODY (ROUTINE TESTING W REFLEX): HIV Screen 4th Generation wRfx: NONREACTIVE

## 2020-02-07 LAB — HEPATITIS B SURFACE ANTIGEN: Hepatitis B Surface Ag: NEGATIVE

## 2020-02-07 MED ORDER — METRONIDAZOLE 500 MG PO TABS: 500.0000 mg | ORAL_TABLET | Freq: Two times a day (BID) | ORAL | 1 refills | Status: DC

## 2020-02-07 NOTE — Addendum Note (Signed)
Addended by: Sharen Counter A on: 02/07/2020 04:45 PM   Modules accepted: Orders

## 2020-02-08 ENCOUNTER — Other Ambulatory Visit: Payer: Self-pay | Admitting: *Deleted

## 2020-02-08 ENCOUNTER — Encounter: Payer: Self-pay | Admitting: *Deleted

## 2020-02-08 DIAGNOSIS — A599 Trichomoniasis, unspecified: Secondary | ICD-10-CM

## 2020-02-08 MED ORDER — METRONIDAZOLE 500 MG PO TABS: 500.0000 mg | ORAL_TABLET | Freq: Two times a day (BID) | ORAL | 1 refills | Status: AC

## 2020-02-08 NOTE — Progress Notes (Signed)
Refill on flagyl sent per [facility] request.

## 2020-02-14 ENCOUNTER — Other Ambulatory Visit: Payer: Self-pay | Admitting: Advanced Practice Midwife

## 2020-02-14 DIAGNOSIS — A599 Trichomoniasis, unspecified: Secondary | ICD-10-CM

## 2020-02-14 MED ORDER — METRONIDAZOLE 500 MG PO TABS: ORAL_TABLET | ORAL | 2 refills | Status: DC

## 2020-02-14 NOTE — Progress Notes (Signed)
lagyl

## 2020-03-01 ENCOUNTER — Other Ambulatory Visit: Payer: Self-pay

## 2020-03-01 MED ORDER — FLUCONAZOLE 150 MG PO TABS
150.0000 mg | ORAL_TABLET | Freq: Once | ORAL | 0 refills | Status: AC
Start: 2020-03-01 — End: 2020-03-01

## 2020-03-01 NOTE — Telephone Encounter (Signed)
Patient thinks she has yeast infection and would like something called into her pharmacy.

## 2020-03-14 ENCOUNTER — Ambulatory Visit (INDEPENDENT_AMBULATORY_CARE_PROVIDER_SITE_OTHER): Payer: BC Managed Care – PPO | Admitting: Obstetrics and Gynecology

## 2020-03-14 ENCOUNTER — Other Ambulatory Visit (HOSPITAL_COMMUNITY)
Admission: RE | Admit: 2020-03-14 | Discharge: 2020-03-14 | Disposition: A | Discharge status: Home / Self Care | Payer: BC Managed Care – PPO | Source: Ambulatory Visit | Attending: Obstetrics and Gynecology | Admitting: Obstetrics and Gynecology

## 2020-03-14 ENCOUNTER — Other Ambulatory Visit: Payer: Self-pay

## 2020-03-14 ENCOUNTER — Encounter: Payer: Self-pay | Admitting: Obstetrics and Gynecology

## 2020-03-14 VITALS — BP 131/80 | HR 66 | Ht 63.0 in | Wt 161.0 lb

## 2020-03-14 DIAGNOSIS — Z113 Encounter for screening for infections with a predominantly sexual mode of transmission: Secondary | ICD-10-CM

## 2020-03-14 DIAGNOSIS — R102 Pelvic and perineal pain unspecified side: Secondary | ICD-10-CM

## 2020-03-14 DIAGNOSIS — A599 Trichomoniasis, unspecified: Secondary | ICD-10-CM | POA: Insufficient documentation

## 2020-03-14 DIAGNOSIS — N760 Acute vaginitis: Secondary | ICD-10-CM | POA: Diagnosis not present

## 2020-03-14 DIAGNOSIS — Z202 Contact with and (suspected) exposure to infections with a predominantly sexual mode of transmission: Secondary | ICD-10-CM | POA: Diagnosis not present

## 2020-03-14 DIAGNOSIS — B9689 Other specified bacterial agents as the cause of diseases classified elsewhere: Secondary | ICD-10-CM | POA: Insufficient documentation

## 2020-03-14 NOTE — Patient Instructions (Signed)
Pelvic Pain, Female Pelvic pain is pain in your lower abdomen, below your belly button and between your hips. The pain may start suddenly (be acute), keep coming back (be recurring), or last a long time (become chronic). Pelvic pain that lasts longer than 6 months is considered chronic. Pelvic pain may affect your:  Reproductive organs.  Urinary system.  Digestive tract.  Musculoskeletal system. There are many potential causes of pelvic pain. Sometimes, the pain can be a result of digestive or urinary conditions, strained muscles or ligaments, or reproductive conditions. Sometimes the cause of pelvic pain is not known. Follow these instructions at home:   Take over-the-counter and prescription medicines only as told by your health care provider.  Rest as told by your health care provider.  Do not have sex if it hurts.  Keep a journal of your pelvic pain. Write down: ? When the pain started. ? Where the pain is located. ? What seems to make the pain better or worse, such as food or your period (menstrual cycle). ? Any symptoms you have along with the pain.  Keep all follow-up visits as told by your health care provider. This is important. Contact a health care provider if:  Medicine does not help your pain.  Your pain comes back.  You have new symptoms.  You have abnormal vaginal discharge or bleeding, including bleeding after menopause.  You have a fever or chills.  You are constipated.  You have blood in your urine or stool.  You have foul-smelling urine.  You feel weak or light-headed. Get help right away if:  You have sudden severe pain.  Your pain gets steadily worse.  You have severe pain along with fever, nausea, vomiting, or excessive sweating.  You lose consciousness. Summary  Pelvic pain is pain in your lower abdomen, below your belly button and between your hips.  There are many potential causes of pelvic pain.  Keep a journal of your pelvic  pain. This information is not intended to replace advice given to you by your health care provider. Make sure you discuss any questions you have with your health care provider. Document Revised: 09/16/2017 Document Reviewed: 09/16/2017 Elsevier Patient Education  2020 [employer]  

## 2020-03-14 NOTE — Progress Notes (Signed)
   Subjective:    Patient ID: Anita Sims, female    DOB: 1995/10/15, 24 y.o.   MRN: [id number] CC: pelvic pain HPI 24 yo G4P3 seen with report of pelvic pain since November.  She had recent cesarean delivery 12/06/19 with [facility] BTL per op note.  She notes occasional cramps.  The pain is intermittent and described as a dull pain.  Patient notes occasional nausea, but no emesis.  There has been no dysuria, constipation/diarrhea or dyspareunia.  She is concerned the tubal ligation may have failed and she might be again pregnant or have an ectopic pregnancy.  She has requested a blood pregnancy test.     Review of Systems  Constitutional: Negative.   HENT: Negative.   Eyes: Negative.   Respiratory: Negative.   Cardiovascular: Negative.   Gastrointestinal: Positive for nausea. Negative for constipation, diarrhea and vomiting.  Genitourinary: Positive for pelvic pain. Negative for dysuria.  Musculoskeletal: Negative.   Neurological: Negative.   Hematological: Negative.        Objective:   Physical Exam Vitals reviewed.  Constitutional:      Appearance: Normal appearance. She is normal weight.  HENT:     Head: Normocephalic and atraumatic.  Cardiovascular:     Rate and Rhythm: Normal rate and regular rhythm.     Heart sounds: Normal heart sounds.  Pulmonary:     Effort: Pulmonary effort is normal.     Breath sounds: Normal breath sounds.  Abdominal:     General: Abdomen is flat. There is no distension.     Palpations: Abdomen is soft. There is no mass.     Tenderness: There is no abdominal tenderness. There is no guarding.  Genitourinary:    Comments: SVE: external genitalia WNL Vagina WNL Cervix WNL, no CMT No adnexal pain or masses Musculoskeletal:        General: Normal range of motion.  Neurological:     Mental Status: She is alert.    Vitals:   03/14/20 1312  BP: 131/80  Pulse: 66         Assessment & Plan:   1. Trichomoniasis TOC along with check  for GC/C taken today  2. Pelvic pain in female Do not believe pain is from female organs.  Did discuss post tubal pain syndrome though.  Pt had no pain from physical or pelvic exam.  She also noted no pain from dyspareunia.  Pelvic u/s was offered, but pt declined.  Discussed with pt to call if pain is still persistent in 2-3 months.  At that time u/s would need to be ordered.  GI evaluation may also need to be evaluated. - Beta hCG quant (ref lab)  3. Trichomonas contact, treated - Cervicovaginal ancillary only( [place])  F/u as above ie 2-3 months if needed  Warden Fillers, MD Faculty Attending, Center for [facility]

## 2020-03-14 NOTE — Progress Notes (Signed)
RGYN patient presents for STD testing today w/ complaints of Pelvic Pain.  *Pt concerned about pain and BTL pt states she is having pregnancy sx's. Pt wants to discuss HCG being drawn today.   +TRICH /+BV on 02/06/20.Marland Kitchen

## 2020-03-15 LAB — CERVICOVAGINAL ANCILLARY ONLY
Bacterial Vaginitis (gardnerella): POSITIVE — AB
Candida Glabrata: NEGATIVE
Candida Vaginitis: NEGATIVE
Chlamydia: NEGATIVE
Comment: NEGATIVE
Comment: NEGATIVE
Comment: NEGATIVE
Comment: NEGATIVE
Comment: NEGATIVE
Comment: NORMAL
Neisseria Gonorrhea: NEGATIVE
Trichomonas: NEGATIVE

## 2020-03-15 LAB — BETA HCG QUANT (REF LAB): hCG Quant: <1 m[IU]/mL

## 2020-03-19 ENCOUNTER — Other Ambulatory Visit: Payer: Self-pay

## 2020-03-19 DIAGNOSIS — A599 Trichomoniasis, unspecified: Secondary | ICD-10-CM

## 2020-03-19 MED ORDER — METRONIDAZOLE 500 MG PO TABS: ORAL_TABLET | ORAL | 2 refills | Status: DC

## 2020-03-21 ENCOUNTER — Other Ambulatory Visit: Payer: Self-pay | Admitting: *Deleted

## 2020-03-21 DIAGNOSIS — B9689 Other specified bacterial agents as the cause of diseases classified elsewhere: Secondary | ICD-10-CM

## 2020-03-21 MED ORDER — METRONIDAZOLE 500 MG PO TABS: 500.0000 mg | ORAL_TABLET | Freq: Two times a day (BID) | ORAL | 0 refills | Status: DC

## 2020-03-21 NOTE — Progress Notes (Signed)
Flagyl sent for +BV °See lab result °

## 2020-03-23 ENCOUNTER — Encounter: Payer: Self-pay | Admitting: General Practice

## 2020-04-17 ENCOUNTER — Other Ambulatory Visit: Payer: Self-pay

## 2020-04-17 DIAGNOSIS — B009 Herpesviral infection, unspecified: Secondary | ICD-10-CM

## 2020-04-17 MED ORDER — VALACYCLOVIR HCL 500 MG PO TABS: 500.0000 mg | ORAL_TABLET | Freq: Every day | ORAL | 11 refills | Status: DC

## 2020-04-17 NOTE — Telephone Encounter (Signed)
Patient is requesting valtrex for herpes outbreak.

## 2020-04-27 ENCOUNTER — Other Ambulatory Visit (HOSPITAL_COMMUNITY)
Admission: RE | Admit: 2020-04-27 | Discharge: 2020-04-27 | Disposition: A | Discharge status: Home / Self Care | Payer: BC Managed Care – PPO | Source: Ambulatory Visit | Attending: Obstetrics | Admitting: Obstetrics

## 2020-04-27 ENCOUNTER — Ambulatory Visit: Payer: Medicaid Other

## 2020-04-27 ENCOUNTER — Other Ambulatory Visit: Payer: Self-pay

## 2020-04-27 DIAGNOSIS — N898 Other specified noninflammatory disorders of vagina: Secondary | ICD-10-CM | POA: Insufficient documentation

## 2020-04-27 NOTE — Progress Notes (Signed)
Agree with A & P. 

## 2020-04-27 NOTE — Progress Notes (Signed)
Pt is in the office for self swab Pt reports vaginal discharge and odor.

## 2020-04-30 LAB — CERVICOVAGINAL ANCILLARY ONLY
Bacterial Vaginitis (gardnerella): POSITIVE — AB
Candida Glabrata: NEGATIVE
Candida Vaginitis: NEGATIVE
Chlamydia: NEGATIVE
Comment: NEGATIVE
Comment: NEGATIVE
Comment: NEGATIVE
Comment: NEGATIVE
Comment: NEGATIVE
Comment: NORMAL
Neisseria Gonorrhea: NEGATIVE
Trichomonas: NEGATIVE

## 2020-05-01 ENCOUNTER — Other Ambulatory Visit: Payer: Self-pay | Admitting: *Deleted

## 2020-05-01 DIAGNOSIS — B9689 Other specified bacterial agents as the cause of diseases classified elsewhere: Secondary | ICD-10-CM

## 2020-05-01 DIAGNOSIS — N760 Acute vaginitis: Secondary | ICD-10-CM

## 2020-05-01 MED ORDER — METRONIDAZOLE 500 MG PO TABS: 500.0000 mg | ORAL_TABLET | Freq: Two times a day (BID) | ORAL | 0 refills | Status: DC

## 2020-05-01 NOTE — Progress Notes (Signed)
Flagyl sent in for +BV.  Pt aware.

## 2020-06-09 ENCOUNTER — Inpatient Hospital Stay (HOSPITAL_COMMUNITY): Admission: AD | Admit: 2020-06-09 | Payer: Medicaid Other | Admitting: Obstetrics and Gynecology

## 2020-06-12 ENCOUNTER — Ambulatory Visit: Payer: Medicaid Other

## 2020-06-26 ENCOUNTER — Other Ambulatory Visit (HOSPITAL_COMMUNITY)
Admission: RE | Admit: 2020-06-26 | Discharge: 2020-06-26 | Disposition: A | Discharge status: Home / Self Care | Payer: BC Managed Care – PPO | Source: Ambulatory Visit | Attending: Obstetrics | Admitting: Obstetrics

## 2020-06-26 ENCOUNTER — Other Ambulatory Visit: Payer: Self-pay

## 2020-06-26 ENCOUNTER — Ambulatory Visit (INDEPENDENT_AMBULATORY_CARE_PROVIDER_SITE_OTHER): Payer: Medicaid Other

## 2020-06-26 VITALS — Wt 156.0 lb

## 2020-06-26 DIAGNOSIS — Z7251 High risk heterosexual behavior: Secondary | ICD-10-CM | POA: Insufficient documentation

## 2020-06-26 DIAGNOSIS — N898 Other specified noninflammatory disorders of vagina: Secondary | ICD-10-CM | POA: Insufficient documentation

## 2020-06-26 DIAGNOSIS — A599 Trichomoniasis, unspecified: Secondary | ICD-10-CM | POA: Insufficient documentation

## 2020-06-26 DIAGNOSIS — N760 Acute vaginitis: Secondary | ICD-10-CM | POA: Insufficient documentation

## 2020-06-26 DIAGNOSIS — B9689 Other specified bacterial agents as the cause of diseases classified elsewhere: Secondary | ICD-10-CM | POA: Diagnosis not present

## 2020-06-26 NOTE — Progress Notes (Signed)
SUBJECTIVE:  25 y.o. female complains of yellowish vaginal discharge, odor and itching for 5 day(s). Denies abnormal vaginal bleeding or significant pelvic pain or fever. No UTI symptoms. Denies history of known exposure to STD.  Patient's last menstrual period was 06/11/2020 (exact date).  OBJECTIVE:  She appears well, afebrile. Urine dipstick: not done.  ASSESSMENT:  Vaginal Discharge  Vaginal Odor   PLAN:  GC, chlamydia, trichomonas, BVAG, CVAG probe sent to lab. Treatment: To be determined once lab results are received ROV prn if symptoms persist or worsen.

## 2020-06-27 LAB — CERVICOVAGINAL ANCILLARY ONLY
Bacterial Vaginitis (gardnerella): POSITIVE — AB
Candida Glabrata: NEGATIVE
Candida Vaginitis: NEGATIVE
Chlamydia: NEGATIVE
Comment: NEGATIVE
Comment: NEGATIVE
Comment: NEGATIVE
Comment: NEGATIVE
Comment: NEGATIVE
Comment: NORMAL
Neisseria Gonorrhea: NEGATIVE
Trichomonas: POSITIVE — AB

## 2020-06-28 ENCOUNTER — Other Ambulatory Visit: Payer: Self-pay | Admitting: Obstetrics

## 2020-06-28 DIAGNOSIS — N760 Acute vaginitis: Secondary | ICD-10-CM

## 2020-06-28 DIAGNOSIS — B9689 Other specified bacterial agents as the cause of diseases classified elsewhere: Secondary | ICD-10-CM

## 2020-06-28 DIAGNOSIS — A599 Trichomoniasis, unspecified: Secondary | ICD-10-CM

## 2020-06-28 MED ORDER — TINIDAZOLE 500 MG PO TABS: 2.0000 g | ORAL_TABLET | Freq: Once | ORAL | 0 refills | Status: AC

## 2020-06-29 ENCOUNTER — Telehealth: Payer: Self-pay

## 2020-06-29 NOTE — Telephone Encounter (Signed)
Patient called requesting trich treatment for her partners. Patient advised to tell her partners to seek treatment at the HD, urgent care, or pcp. Patient also advised to abstain from intercourse for at least a week after everyone has completed their treatment. Patient verbalized understanding.

## 2020-07-04 ENCOUNTER — Telehealth: Payer: Self-pay

## 2020-07-04 NOTE — Telephone Encounter (Signed)
Received voicemail from pt requesting a call back Unable to reach pt; pt's voicemail is not set up

## 2020-07-09 ENCOUNTER — Ambulatory Visit (INDEPENDENT_AMBULATORY_CARE_PROVIDER_SITE_OTHER): Payer: BC Managed Care – PPO

## 2020-07-09 ENCOUNTER — Other Ambulatory Visit: Payer: Self-pay

## 2020-07-09 ENCOUNTER — Other Ambulatory Visit (HOSPITAL_COMMUNITY)
Admission: RE | Admit: 2020-07-09 | Discharge: 2020-07-09 | Disposition: A | Discharge status: Home / Self Care | Payer: BC Managed Care – PPO | Source: Ambulatory Visit | Attending: Obstetrics | Admitting: Obstetrics

## 2020-07-09 VITALS — BP 149/84 | HR 64 | Wt 158.0 lb

## 2020-07-09 DIAGNOSIS — N898 Other specified noninflammatory disorders of vagina: Secondary | ICD-10-CM

## 2020-07-09 DIAGNOSIS — Z113 Encounter for screening for infections with a predominantly sexual mode of transmission: Secondary | ICD-10-CM | POA: Diagnosis not present

## 2020-07-09 NOTE — Progress Notes (Signed)
I have reviewed the chart and agree with the nurse's note and plan of care for this visit.   Bonne Whack Leftwich-Kirby, CNM 2:09 PM  

## 2020-07-09 NOTE — Progress Notes (Signed)
SUBJECTIVE:  25 y.o. female complains of yellowish, malodorous vaginal discharge for 3 week(s). Denies abnormal vaginal bleeding or significant pelvic pain or fever. No UTI symptoms. Denies history of known exposure to STD.  Patient's last menstrual period was 06/11/2020 (exact date).  OBJECTIVE:  She appears well, afebrile. Urine dipstick: not done.  ASSESSMENT:  Vaginal Discharge  Vaginal Odor Throws up tablets given for +trich and BV.   PLAN:  GC, chlamydia, trichomonas, BVAG, CVAG probe sent to lab. Treatment: To be determined once lab results are received ROV prn if symptoms persist or worsen.

## 2020-07-10 LAB — CERVICOVAGINAL ANCILLARY ONLY
Bacterial Vaginitis (gardnerella): NEGATIVE
Candida Glabrata: NEGATIVE
Candida Vaginitis: NEGATIVE
Chlamydia: NEGATIVE
Comment: NEGATIVE
Comment: NEGATIVE
Comment: NEGATIVE
Comment: NEGATIVE
Comment: NEGATIVE
Comment: NORMAL
Neisseria Gonorrhea: NEGATIVE
Trichomonas: NEGATIVE

## 2020-07-11 ENCOUNTER — Other Ambulatory Visit: Payer: Self-pay | Admitting: Advanced Practice Midwife

## 2020-07-11 DIAGNOSIS — N898 Other specified noninflammatory disorders of vagina: Secondary | ICD-10-CM

## 2020-07-11 MED ORDER — METRONIDAZOLE 500 MG PO TABS: 500.0000 mg | ORAL_TABLET | Freq: Two times a day (BID) | ORAL | 0 refills | Status: AC

## 2020-07-11 MED ORDER — FLUCONAZOLE 150 MG PO TABS: 150.0000 mg | ORAL_TABLET | Freq: Once | ORAL | 0 refills | Status: AC

## 2020-07-11 MED ORDER — ONDANSETRON HCL 4 MG PO TABS: 4.0000 mg | ORAL_TABLET | Freq: Three times a day (TID) | ORAL | 0 refills | Status: AC | PRN

## 2020-07-23 ENCOUNTER — Ambulatory Visit (INDEPENDENT_AMBULATORY_CARE_PROVIDER_SITE_OTHER): Payer: Medicaid Other

## 2020-07-23 ENCOUNTER — Other Ambulatory Visit (HOSPITAL_COMMUNITY)
Admission: RE | Admit: 2020-07-23 | Discharge: 2020-07-23 | Disposition: A | Discharge status: Home / Self Care | Payer: BC Managed Care – PPO | Source: Ambulatory Visit | Attending: Obstetrics and Gynecology | Admitting: Obstetrics and Gynecology

## 2020-07-23 ENCOUNTER — Other Ambulatory Visit: Payer: Self-pay

## 2020-07-23 DIAGNOSIS — N898 Other specified noninflammatory disorders of vagina: Secondary | ICD-10-CM | POA: Insufficient documentation

## 2020-07-23 DIAGNOSIS — Z202 Contact with and (suspected) exposure to infections with a predominantly sexual mode of transmission: Secondary | ICD-10-CM | POA: Insufficient documentation

## 2020-07-23 NOTE — Progress Notes (Signed)
SUBJECTIVE:  25 y.o. female complains of white vaginal discharge. Pt  admits to having IC with her partner and she is unsure if they were treated for Trich. Denies abnormal vaginal bleeding or significant pelvic pain or fever. No UTI symptoms.  OBJECTIVE:  She appears well, afebrile. Urine dipstick: not done.  ASSESSMENT:  Vaginal Discharge    PLAN:  GC, chlamydia, trichomonas, BVAG, CVAG probe sent to lab. Treatment: To be determined once lab results are received and reviewed by the MD ROV prn if symptoms persist or worsen.

## 2020-07-24 LAB — CERVICOVAGINAL ANCILLARY ONLY
Bacterial Vaginitis (gardnerella): NEGATIVE
Candida Glabrata: NEGATIVE
Candida Vaginitis: NEGATIVE
Chlamydia: NEGATIVE
Comment: NEGATIVE
Comment: NEGATIVE
Comment: NEGATIVE
Comment: NEGATIVE
Comment: NEGATIVE
Comment: NORMAL
Neisseria Gonorrhea: NEGATIVE
Trichomonas: NEGATIVE

## 2020-07-24 NOTE — Progress Notes (Signed)
Patient was assessed and managed by nursing staff during this encounter. I have reviewed the chart and agree with the documentation and plan. I have also made any necessary editorial changes.  Catalina Antigua, MD 07/24/2020 3:39 PM

## 2020-08-14 ENCOUNTER — Ambulatory Visit: Payer: BC Managed Care – PPO | Admitting: Podiatry

## 2020-08-14 ENCOUNTER — Ambulatory Visit (INDEPENDENT_AMBULATORY_CARE_PROVIDER_SITE_OTHER): Payer: BC Managed Care – PPO

## 2020-08-14 ENCOUNTER — Other Ambulatory Visit: Payer: Self-pay

## 2020-08-14 DIAGNOSIS — M21612 Bunion of left foot: Secondary | ICD-10-CM | POA: Diagnosis not present

## 2020-08-14 DIAGNOSIS — M21619 Bunion of unspecified foot: Secondary | ICD-10-CM

## 2020-08-14 DIAGNOSIS — M79672 Pain in left foot: Secondary | ICD-10-CM | POA: Diagnosis not present

## 2020-08-14 NOTE — Patient Instructions (Signed)

## 2020-08-16 ENCOUNTER — Telehealth: Payer: Self-pay | Admitting: Urology

## 2020-08-16 NOTE — Telephone Encounter (Signed)
DOS - 08/29/20  LAPIDUS PROC. INCLUDING BUNIONECTOMY LEFT --- 28297   SPOKE WITH LATISHA M WITH [employer] AND SHE STATED THAT FOR CPT CODE 34287 NO PRIOR AUTH IS REQUIRED.  REF # Clovis Fredrickson 08/16/20

## 2020-08-17 ENCOUNTER — Other Ambulatory Visit: Payer: Self-pay

## 2020-08-17 ENCOUNTER — Ambulatory Visit
Admission: EM | Admit: 2020-08-17 | Discharge: 2020-08-17 | Disposition: A | Discharge status: Home / Self Care | Payer: BC Managed Care – PPO | Attending: Internal Medicine | Admitting: Internal Medicine

## 2020-08-17 ENCOUNTER — Encounter: Payer: Self-pay | Admitting: Emergency Medicine

## 2020-08-17 DIAGNOSIS — N760 Acute vaginitis: Secondary | ICD-10-CM | POA: Insufficient documentation

## 2020-08-17 NOTE — ED Triage Notes (Signed)
Pt sts vaginal discharge and odor since having sexual intercourse on Wednesday; pt denies concern for pregnancy

## 2020-08-17 NOTE — ED Provider Notes (Signed)
[name]    CSN: [id number] Arrival date & time: 08/17/20  1807      History   Chief Complaint Chief Complaint  Patient presents with  . Exposure to STD    HPI Anita Sims is a 25 y.o. female comes to the urgent care with a 1-day history of malodorous vaginal discharge.   Patient had unprotected sexual intercourse a couple of days ago.  The individual who was not his regular sexual partner.  She denies any dysuria, urgency or frequency.  No abdominal pain.  No dyspareunia.  No rash in the vaginal area.  HPI  Past Medical History:  Diagnosis Date  . Anemia    hx of, but not during pregnancy  . Anxiety    "was on medication long time ago"; pt states  . Bipolar 1 disorder (HCC)    "was on medication a long time ago" pt states  . Chlamydia   . Genital herpes   . Gonorrhea   . Substance abuse [facility])     Patient Active Problem List   Diagnosis Date Noted  . Pelvic pain in female 03/14/2020  . Trichomonas contact, treated 03/14/2020  . Screening examination for STD (sexually transmitted disease) 03/14/2020  . Status post cesarean delivery 11/26/2019  . Unwanted fertility 10/19/2019  . Marijuana user 05/16/2019  . Herpes simplex type 2 (HSV-2) infection affecting pregnancy, antepartum 05/16/2019  . Miscarriage within last 12 months 03/14/2019  . Polysubstance abuse (HCC) 09/01/2017    Past Surgical History:  Procedure Laterality Date  . CESAREAN SECTION  11/26/2019   Procedure: CESAREAN SECTION;  Surgeon: Fitzgerald Bing, MD;  Location: [facility] LD ORS;  Service: Obstetrics;;  . MOUTH SURGERY      OB History    Gravida  4   Para  3   Term  3   Preterm  0   AB  1   Living  3     SAB  1   IAB  0   Ectopic  0   Multiple  0   Live Births  3            Home Medications    Prior to Admission medications   Medication Sig Start Date End Date Taking? Authorizing Provider  ondansetron (ZOFRAN) 4 MG tablet Take 1 tablet (4 mg  total) by mouth every 8 (eight) hours as needed for nausea or vomiting. 07/11/20   Leftwich-Kirby, Wilmer Floor, CNM  valACYclovir (VALTREX) 500 MG tablet Take 1 tablet (500 mg total) by mouth 2 (two) times daily. Patient not taking: No sig reported 10/19/19   Conan Bowens, MD  valACYclovir (VALTREX) 500 MG tablet Take 1 tablet (500 mg total) by mouth daily. 04/17/20   Constant, Peggy, MD  loratadine (CLARITIN) 10 MG tablet Take 1 tablet (10 mg total) by mouth daily. Patient not taking: No sig reported 08/10/19 08/17/20  Brock Bad, MD    Family History Family History  Problem Relation Age of Onset  . Diabetes Maternal Grandfather        type II  . Cancer Maternal Grandfather   . Heart disease Maternal Grandmother   . Heart disease Mother     Social History Social History   Tobacco Use  . Smoking status: Former Smoker    Types: Cigars  . Smokeless tobacco: Never Used  . Tobacco comment: Hookah  Vaping Use  . Vaping Use: Never used  Substance Use Topics  . Alcohol use: Not Currently  Comment: last use 02/12/2019  . Drug use: Not Currently    Types: Marijuana    Comment: Last use- 02/12/2019     Allergies   Patient has no known allergies.   Review of Systems Review of Systems  Genitourinary: Positive for vaginal discharge. Negative for dysuria, frequency and urgency.     Physical Exam Triage Vital Signs ED Triage Vitals [08/17/20 1829]  Enc Vitals Group     BP 109/72     Pulse Rate 65     Resp 18     Temp 98.3 F (36.8 C)     Temp Source Oral     SpO2 98 %     Weight      Height      Head Circumference      Peak Flow      Pain Score 0     Pain Loc      Pain Edu?      Excl. in GC?    No data found.  Updated Vital Signs BP 109/72 (BP Location: Left Arm)   Pulse 65   Temp 98.3 F (36.8 C) (Oral)   Resp 18   SpO2 98%   Visual Acuity Right Eye Distance:   Left Eye Distance:   Bilateral Distance:    Right Eye Near:   Left Eye Near:    Bilateral  Near:     Physical Exam Vitals reviewed.  Constitutional:      General: She is not in acute distress.    Appearance: She is not ill-appearing.  Cardiovascular:     Rate and Rhythm: Normal rate and regular rhythm.  Abdominal:     General: Bowel sounds are normal. There is no distension.     Tenderness: There is no abdominal tenderness.     Hernia: No hernia is present.  Musculoskeletal:        General: Normal range of motion.  Skin:    General: Skin is warm.     Capillary Refill: Capillary refill takes less than 2 seconds.  Neurological:     Mental Status: She is alert.      UC Treatments / Results  Labs (all labs ordered are listed, but only abnormal results are displayed) Labs Reviewed  CERVICOVAGINAL ANCILLARY ONLY    EKG   Radiology No results found.  Procedures Procedures (including critical care time)  Medications Ordered in UC Medications - No data to display  Initial Impression / Assessment and Plan / UC Course  I have reviewed the triage vital signs and the nursing notes.  Pertinent labs & imaging results that were available during my care of the patient were reviewed by me and considered in my medical decision making (see chart for details).     1.  Acute vaginitis: Cervical vaginal swab for GC/chlamydia/trichomonas/bacterial vaginosis/vaginal yeast infection We will call you with lab results if abnormal  Final Clinical Impressions(s) / UC Diagnoses   Final diagnoses:  Acute vaginitis     Discharge Instructions     No sexual intercourse until the know the lab results We will call you with the lab results if abnormal Return to urgent care if you have any worsening symptoms.   ED Prescriptions    None     PDMP not reviewed this encounter.   Merrilee Jansky, MD 08/17/20 1910

## 2020-08-17 NOTE — Discharge Instructions (Signed)
No sexual intercourse until the know the lab results We will call you with the lab results if abnormal Return to urgent care if you have any worsening symptoms.

## 2020-08-20 ENCOUNTER — Ambulatory Visit: Payer: Medicaid Other

## 2020-08-20 ENCOUNTER — Telehealth (HOSPITAL_COMMUNITY): Payer: Self-pay | Admitting: Emergency Medicine

## 2020-08-20 LAB — CERVICOVAGINAL ANCILLARY ONLY
Bacterial Vaginitis (gardnerella): POSITIVE — AB
Candida Glabrata: NEGATIVE
Candida Vaginitis: NEGATIVE
Chlamydia: NEGATIVE
Comment: NEGATIVE
Comment: NEGATIVE
Comment: NEGATIVE
Comment: NEGATIVE
Comment: NEGATIVE
Comment: NORMAL
Neisseria Gonorrhea: NEGATIVE
Trichomonas: NEGATIVE

## 2020-08-20 MED ORDER — METRONIDAZOLE 500 MG PO TABS
500.0000 mg | ORAL_TABLET | Freq: Two times a day (BID) | ORAL | 0 refills | Status: DC
Start: 2020-08-20 — End: 2020-09-20

## 2020-08-20 NOTE — Progress Notes (Signed)
Subjective:   Patient ID: Anita Sims, female   DOB: 25 y.o.   MRN: [id number]   HPI 25 year old female presents the office today for concerns of a painful left bunion.  States that is been ongoing for quite some time has been getting larger and causing more discomfort.  She has tried changing shoes, offloading, and multiple over-the-counter products without any improvement. She is interested in surgical intervention.  No other concerns today.   Review of Systems  All other systems reviewed and are negative.  Past Medical History:  Diagnosis Date  . Anemia    hx of, but not during pregnancy  . Anxiety    "was on medication long time ago"; pt states  . Bipolar 1 disorder (HCC)    "was on medication a long time ago" pt states  . Chlamydia   . Genital herpes   . Gonorrhea   . Substance abuse [facility])     Past Surgical History:  Procedure Laterality Date  . CESAREAN SECTION  11/26/2019   Procedure: CESAREAN SECTION;  Surgeon: [place] Bing, MD;  Location: [facility] LD [facility]S;  Service: Obstetrics;;  . MOUTH SURGERY       Current Outpatient Medications:  .  ondansetron (ZOFRAN) 4 MG tablet, Take 1 tablet (4 mg total) by mouth every 8 (eight) hours as needed for nausea or vomiting., Disp: 20 tablet, Rfl: 0 .  valACYclovir (VALTREX) 500 MG tablet, Take 1 tablet (500 mg total) by mouth 2 (two) times daily. (Patient not taking: No sig reported), Disp: 60 tablet, Rfl: 6 .  valACYclovir (VALTREX) 500 MG tablet, Take 1 tablet (500 mg total) by mouth daily., Disp: 30 tablet, Rfl: 11  No Known Allergies       Objective:  Physical Exam  General: AAO x3, NAD  Dermatological: Mild erythema on the medial first metatarsal head on the left foot on the bunion from irritation inside shoes.  There is no skin breakdown, warmth or any signs of infection.  There are no open sores, no preulcerative lesions, no rash or signs of infection present.  Vascular: Dorsalis Pedis artery and Posterior Tibial  artery pedal pulses are 2/4 bilateral with immedate capillary fill time. There is no pain with calf compression, swelling, warmth, erythema.   Neruologic: Grossly intact via light touch bilateral.   Musculoskeletal: Significant bunion present on the left foot.  There is tenderness on the medial first metatarsal head.  There is no pain or crepitation with MPJ range of motion.  Hypermobility the first ray is present.  Muscular strength 5/5 in all groups tested bilateral.  Gait: Unassisted, Nonantalgic.       Assessment:   25 year old female left foot significant bunion deformity     Plan:  -Treatment options discussed including all alternatives, risks, and complications -Etiology of symptoms were discussed -X-rays were obtained and reviewed with the patient.  Severe bunion is present.  No evidence of acute fracture. -We discussed both conservative as well as surgical treatment options.  Conservatively we discussed changing shoes, arch supports mildly pads, as braces to help hold the toe rectus.  She is also tried multiple this treatment already.  She is interested in surgery.  We discussed a Lapidus bunionectom including the procedure as well as postoperative course.  After discussion she wants to proceed with surgery. -The incision placement as well as the postoperative course was discussed with the patient. I discussed risks of the surgery which include, but not limited to, infection, bleeding, pain, swelling, need  for further surgery, delayed or nonhealing, painful or ugly scar, numbness or sensation changes, over/under correction, recurrence, transfer lesions, further deformity, hardware failure, DVT/PE, loss of toe/foot. Patient understands these risks and wishes to proceed with surgery. The surgical consent was reviewed with the patient all 3 pages were signed. No promises or guarantees were given to the outcome of the procedure. All questions were answered to the best of my ability. Before  the surgery the patient was encouraged to call the office if there is any further questions. The surgery will be performed at the [facility] on an outpatient basis. -CAM boot dispensed for post-op use.   Vivi Barrack DPM

## 2020-08-27 ENCOUNTER — Telehealth: Payer: Self-pay

## 2020-08-27 NOTE — Telephone Encounter (Signed)
Anita Sims called to reschedule her surgery with Dr. Ardelle Anton on 5/18/222. She stated she needed to hold off till September to do her surgery. I have her rescheduled to 01/02/2021. Notified Dr. Ardelle Anton and Aram Beecham with [facility]<

## 2020-09-03 ENCOUNTER — Encounter: Payer: BC Managed Care – PPO | Admitting: Podiatry

## 2020-09-13 ENCOUNTER — Encounter: Payer: BC Managed Care – PPO | Admitting: Podiatry

## 2020-09-18 ENCOUNTER — Ambulatory Visit: Payer: Medicaid Other

## 2020-09-19 ENCOUNTER — Other Ambulatory Visit (HOSPITAL_COMMUNITY)
Admission: RE | Admit: 2020-09-19 | Discharge: 2020-09-19 | Disposition: A | Discharge status: Home / Self Care | Payer: BC Managed Care – PPO | Source: Ambulatory Visit | Attending: Family Medicine | Admitting: Family Medicine

## 2020-09-19 ENCOUNTER — Ambulatory Visit (INDEPENDENT_AMBULATORY_CARE_PROVIDER_SITE_OTHER): Payer: Medicaid Other

## 2020-09-19 ENCOUNTER — Other Ambulatory Visit: Payer: Self-pay

## 2020-09-19 DIAGNOSIS — N898 Other specified noninflammatory disorders of vagina: Secondary | ICD-10-CM

## 2020-09-19 NOTE — Progress Notes (Signed)
Pt is in the office for self swab. Pt reports vaginal discharge and odor and would like std testing

## 2020-09-20 LAB — CERVICOVAGINAL ANCILLARY ONLY
Bacterial Vaginitis (gardnerella): POSITIVE — AB
Candida Glabrata: NEGATIVE
Candida Vaginitis: NEGATIVE
Chlamydia: NEGATIVE
Comment: NEGATIVE
Comment: NEGATIVE
Comment: NEGATIVE
Comment: NEGATIVE
Comment: NEGATIVE
Comment: NORMAL
Neisseria Gonorrhea: NEGATIVE
Trichomonas: NEGATIVE

## 2020-09-20 MED ORDER — METRONIDAZOLE 500 MG PO TABS: 500.0000 mg | ORAL_TABLET | Freq: Two times a day (BID) | ORAL | 0 refills | Status: DC

## 2020-09-20 NOTE — Progress Notes (Signed)
Chart reviewed for nurse visit. Agree with plan of care.   Venora Maples, MD 09/20/20 10:28 AM

## 2020-09-20 NOTE — Addendum Note (Signed)
Addended by: Merian Capron on: 09/20/2020 03:25 PM   Modules accepted: Orders

## 2020-09-27 ENCOUNTER — Encounter: Payer: BC Managed Care – PPO | Admitting: Podiatry
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
## 2020-10-22 ENCOUNTER — Other Ambulatory Visit: Payer: Self-pay

## 2020-10-22 ENCOUNTER — Ambulatory Visit
Admission: RE | Admit: 2020-10-22 | Discharge: 2020-10-22 | Disposition: A | Discharge status: Home / Self Care | Payer: BC Managed Care – PPO | Source: Ambulatory Visit | Attending: Family Medicine | Admitting: Family Medicine

## 2020-10-22 VITALS — BP 109/74 | HR 58 | Temp 97.7°F | Resp 14

## 2020-10-22 DIAGNOSIS — R300 Dysuria: Secondary | ICD-10-CM | POA: Diagnosis present

## 2020-10-22 DIAGNOSIS — R350 Frequency of micturition: Secondary | ICD-10-CM | POA: Insufficient documentation

## 2020-10-22 DIAGNOSIS — N760 Acute vaginitis: Secondary | ICD-10-CM | POA: Insufficient documentation

## 2020-10-22 HISTORY — DX: Other specified bacterial agents as the cause of diseases classified elsewhere: B96.89

## 2020-10-22 HISTORY — DX: Other specified bacterial agents as the cause of diseases classified elsewhere: N76.0

## 2020-10-22 LAB — POCT URINALYSIS DIP (MANUAL ENTRY)
Bilirubin, UA: NEGATIVE
Glucose, UA: NEGATIVE mg/dL
Ketones, POC UA: NEGATIVE mg/dL
Nitrite, UA: NEGATIVE
Protein Ur, POC: NEGATIVE mg/dL
Spec Grav, UA: >=1.03 — AB (ref 1.010–1.025)
Urobilinogen, UA: 0.2 E.U./dL
pH, UA: 6 (ref 5.0–8.0)

## 2020-10-22 LAB — POCT URINE PREGNANCY: Preg Test, Ur: NEGATIVE

## 2020-10-22 MED ORDER — METRONIDAZOLE 500 MG PO TABS: 500.0000 mg | ORAL_TABLET | Freq: Two times a day (BID) | ORAL | 0 refills | Status: DC

## 2020-10-22 NOTE — ED Triage Notes (Addendum)
Reports vaginal discharge onset approx 5 days ago; states gets frequent BV infections; used boric acid 2 days ago.  Also c/o dysuria onset approx 2 days ago with slight urinary urgency.  Denies fevers.  C/O slight suprapubic and low back pain. Pt reports starting her menstrual period a few days late, and then it only lasted couple days; wishes to be checked for pregnancy.

## 2020-10-22 NOTE — ED Provider Notes (Signed)
[name]    CSN: [id number] Arrival date & time: 10/22/20  0839      History   Chief Complaint Chief Complaint  Patient presents with   Dysuria   Vaginal Discharge    HPI Anita Sims is a 25 y.o. female.   Patient presenting today with 4 to 5-day history of urinary frequency, dysuria, lower abdominal pressure, white malodorous vaginal discharge.  Denies pelvic pain, vaginal pain, rashes, fever, chills, nausea, vomiting, diarrhea, new sexual partners, new soaps or feminine washes.  She states she would also like to be tested for pregnancy as her period was shorter and lighter than usual, started 10/15/2020.  So far trying boric acid suppositories over-the-counter with mild relief temporarily.  History of BV has felt similar.  States she is never had a urinary tract infection in the past.   Past Medical History:  Diagnosis Date   Anemia    hx of, but not during pregnancy   Anxiety    "was on medication long time ago"; pt states   Bacterial vaginosis    Bipolar 1 disorder (HCC)    "was on medication a long time ago" pt states   Chlamydia    Genital herpes    Gonorrhea    Substance abuse (HCC)     Patient Active Problem List   Diagnosis Date Noted   Pelvic pain in female 03/14/2020   Trichomonas contact, treated 03/14/2020   Screening examination for STD (sexually transmitted disease) 03/14/2020   Status post cesarean delivery 11/26/2019   Unwanted fertility 10/19/2019   Marijuana user 05/16/2019   Herpes simplex type 2 (HSV-2) infection affecting pregnancy, antepartum 05/16/2019   Miscarriage within last 12 months 03/14/2019   Polysubstance abuse (HCC) 09/01/2017    Past Surgical History:  Procedure Laterality Date   CESAREAN SECTION  11/26/2019   Procedure: CESAREAN SECTION;  Surgeon: Blevins Bing, MD;  Location: [facility] LD [facility]S;  Service: Obstetrics;;   MOUTH SURGERY     TUBAL LIGATION      OB History     Gravida  4   Para  3   Term   3   Preterm  0   AB  1   Living  3      SAB  1   IAB  0   Ectopic  0   Multiple  0   Live Births  3            Home Medications    Prior to Admission medications   Medication Sig Start Date End Date Taking? Authorizing Provider  metroNIDAZOLE (FLAGYL) 500 MG tablet Take 1 tablet (500 mg total) by mouth 2 (two) times daily. 10/22/20   Particia Nearing, PA-C  ondansetron (ZOFRAN) 4 MG tablet Take 1 tablet (4 mg total) by mouth every 8 (eight) hours as needed for nausea or vomiting. Patient not taking: No sig reported 07/11/20   Leftwich-Kirby, Wilmer Floor, CNM  valACYclovir (VALTREX) 500 MG tablet Take 1 tablet (500 mg total) by mouth 2 (two) times daily. Patient not taking: No sig reported 10/19/19   Conan Bowens, MD  valACYclovir (VALTREX) 500 MG tablet Take 1 tablet (500 mg total) by mouth daily. Patient not taking: No sig reported 04/17/20   Constant, Peggy, MD  loratadine (CLARITIN) 10 MG tablet Take 1 tablet (10 mg total) by mouth daily. Patient not taking: No sig reported 08/10/19 08/17/20  Brock Bad, MD    Family History Family History  Problem Relation Age of Onset   Diabetes Maternal Grandfather        type II   Cancer Maternal Grandfather    Heart disease Maternal Grandmother    Heart disease Mother     Social History Social History   Tobacco Use   Smoking status: Former    Pack years: 0.00    Types: Cigars   Smokeless tobacco: Never   Tobacco comments:    Hookah  Vaping Use   Vaping Use: Never used  Substance Use Topics   Alcohol use: Not Currently    Comment: last use 02/12/2019   Drug use: Yes    Types: Marijuana    Comment: occasionally     Allergies   Patient has no known allergies.   Review of Systems Review of Systems Per HPI  Physical Exam Triage Vital Signs ED Triage Vitals  Enc Vitals Group     BP 10/22/20 0846 109/74     Pulse Rate 10/22/20 0846 (!) 58     Resp 10/22/20 0846 14     Temp 10/22/20 0846 97.7 F  (36.5 C)     Temp Source 10/22/20 0846 Temporal     SpO2 10/22/20 0846 99 %     Weight --      Height --      Head Circumference --      Peak Flow --      Pain Score 10/22/20 0848 7     Pain Loc --      Pain Edu? --      Excl. in GC? --    No data found.  Updated Vital Signs BP 109/74   Pulse (!) 58   Temp 97.7 F (36.5 C) (Temporal)   Resp 14   LMP 10/15/2020 (Exact Date)   SpO2 99%   Breastfeeding No   Visual Acuity Right Eye Distance:   Left Eye Distance:   Bilateral Distance:    Right Eye Near:   Left Eye Near:    Bilateral Near:     Physical Exam Vitals and nursing note reviewed.  Constitutional:      Appearance: Normal appearance. She is not ill-appearing.  HENT:     Head: Atraumatic.     Mouth/Throat:     Mouth: Mucous membranes are moist.  Eyes:     Extraocular Movements: Extraocular movements intact.     Conjunctiva/sclera: Conjunctivae normal.  Cardiovascular:     Rate and Rhythm: Normal rate and regular rhythm.     Heart sounds: Normal heart sounds.  Pulmonary:     Effort: Pulmonary effort is normal.     Breath sounds: Normal breath sounds.  Abdominal:     General: Bowel sounds are normal. There is no distension.     Palpations: Abdomen is soft.     Tenderness: There is no abdominal tenderness. There is no right CVA tenderness, left CVA tenderness or guarding.  Genitourinary:    Comments: GU exam deferred, self swab performed Musculoskeletal:        General: Normal range of motion.     Cervical back: Normal range of motion and neck supple.  Skin:    General: Skin is warm and dry.  Neurological:     Mental Status: She is alert and oriented to person, place, and time.  Psychiatric:        Mood and Affect: Mood normal.        Thought Content: Thought content normal.  Judgment: Judgment normal.   UC Treatments / Results  Labs (all labs ordered are listed, but only abnormal results are displayed) Labs Reviewed  POCT URINALYSIS DIP  (MANUAL ENTRY) - Abnormal; Notable for the following components:      Result Value   Spec Grav, UA >=1.030 (*)    Blood, UA trace-intact (*)    Leukocytes, UA Small (1+) (*)    All other components within normal limits  URINE CULTURE  POCT URINE PREGNANCY  CERVICOVAGINAL ANCILLARY ONLY    EKG   Radiology No results found.  Procedures Procedures (including critical care time)  Medications Ordered in UC Medications - No data to display  Initial Impression / Assessment and Plan / UC Course  I have reviewed the triage vital signs and the nursing notes.  Pertinent labs & imaging results that were available during my care of the patient were reviewed by me and considered in my medical decision making (see chart for details).     Vitals and exam benign and reassuring, urine pregnancy negative, UA showing small leukocytes.  Urine culture and vaginal swab pending, given her history of BV will treat with metronidazole for suspected BV while awaiting these results.  Continue boric acid suppositories, discussed good vaginal hygiene with unscented soaps, no feminine washes or douching.  Adjust treatment based on the remaining results.  Return for acutely worsening symptoms at any time.  Final Clinical Impressions(s) / UC Diagnoses   Final diagnoses:  Acute vaginitis  Urinary frequency  Dysuria   Discharge Instructions   None    ED Prescriptions     Medication Sig Dispense Auth. Provider   metroNIDAZOLE (FLAGYL) 500 MG tablet Take 1 tablet (500 mg total) by mouth 2 (two) times daily. 14 tablet Particia Nearing, [place]      PDMP not reviewed this encounter.   Roosvelt Maser [place], [place] 10/22/20 [phone]

## 2020-10-23 LAB — CERVICOVAGINAL ANCILLARY ONLY
Bacterial Vaginitis (gardnerella): POSITIVE — AB
Candida Glabrata: NEGATIVE
Candida Vaginitis: NEGATIVE
Chlamydia: NEGATIVE
Comment: NEGATIVE
Comment: NEGATIVE
Comment: NEGATIVE
Comment: NEGATIVE
Comment: NEGATIVE
Comment: NORMAL
Neisseria Gonorrhea: POSITIVE — AB
Trichomonas: NEGATIVE

## 2020-10-24 ENCOUNTER — Ambulatory Visit: Payer: Medicaid Other

## 2020-10-24 LAB — URINE CULTURE: Culture: NO GROWTH

## 2020-10-25 ENCOUNTER — Telehealth (HOSPITAL_COMMUNITY): Payer: Self-pay | Admitting: Emergency Medicine

## 2020-10-25 ENCOUNTER — Ambulatory Visit
Admission: EM | Admit: 2020-10-25 | Discharge: 2020-10-25 | Disposition: A | Discharge status: Home / Self Care | Payer: BC Managed Care – PPO | Attending: Nurse Practitioner | Admitting: Nurse Practitioner

## 2020-10-25 DIAGNOSIS — A549 Gonococcal infection, unspecified: Secondary | ICD-10-CM | POA: Diagnosis not present

## 2020-10-25 MED ORDER — CEFTRIAXONE SODIUM 1 G IJ SOLR
0.5000 g | Freq: Once | INTRAMUSCULAR | Status: AC
  Administered 2020-10-25: 0.5 g via INTRAMUSCULAR

## 2020-10-25 NOTE — Telephone Encounter (Signed)
Per protocol, patient iwll need treatment with IM Rocephin 500mg  for positive Gonorrhea.   Contacted patient by phone.  Verified identity using two identifiers.  Provided positive result.  Reviewed safe sex practices, notifying partners, and refraining from sexual activities for 7 days from time of treatment.  Patient verified understanding, all questions answered.

## 2020-10-25 NOTE — ED Triage Notes (Signed)
Pt presents today for STD tx 

## 2020-11-01 ENCOUNTER — Ambulatory Visit
Admission: RE | Admit: 2020-11-01 | Discharge: 2020-11-01 | Disposition: A | Discharge status: Home / Self Care | Payer: BC Managed Care – PPO | Source: Ambulatory Visit | Attending: Physician Assistant | Admitting: Physician Assistant

## 2020-11-01 ENCOUNTER — Other Ambulatory Visit: Payer: Self-pay

## 2020-11-01 VITALS — BP 120/90 | HR 67 | Temp 98.0°F | Resp 18

## 2020-11-01 DIAGNOSIS — Z202 Contact with and (suspected) exposure to infections with a predominantly sexual mode of transmission: Secondary | ICD-10-CM | POA: Diagnosis not present

## 2020-11-01 DIAGNOSIS — R350 Frequency of micturition: Secondary | ICD-10-CM | POA: Diagnosis present

## 2020-11-01 DIAGNOSIS — R3915 Urgency of urination: Secondary | ICD-10-CM | POA: Insufficient documentation

## 2020-11-01 DIAGNOSIS — A549 Gonococcal infection, unspecified: Secondary | ICD-10-CM | POA: Diagnosis present

## 2020-11-01 LAB — POCT URINALYSIS DIP (MANUAL ENTRY)
Bilirubin, UA: NEGATIVE
Blood, UA: NEGATIVE
Glucose, UA: NEGATIVE mg/dL
Ketones, POC UA: NEGATIVE mg/dL
Leukocytes, UA: NEGATIVE
Nitrite, UA: NEGATIVE
Protein Ur, POC: NEGATIVE mg/dL
Spec Grav, UA: 1.025 (ref 1.010–1.025)
Urobilinogen, UA: 0.2 E.U./dL
pH, UA: 7 (ref 5.0–8.0)

## 2020-11-01 LAB — POCT URINE PREGNANCY: Preg Test, Ur: NEGATIVE

## 2020-11-01 MED ORDER — CEFTRIAXONE SODIUM 500 MG IJ SOLR
500.0000 mg | Freq: Once | INTRAMUSCULAR | Status: AC
  Administered 2020-11-01: 500 mg via INTRAMUSCULAR

## 2020-11-01 NOTE — ED Triage Notes (Signed)
Pt present vaginal pain and pressure to urinated. Pt recently tested positive for Bacterial vaginitis and gonorrhea. Pt states she completed the mediation for bacterial vaginitis  but never receive treatment for gonorrhea.

## 2020-11-01 NOTE — ED Provider Notes (Signed)
[name]    CSN: [id number] Arrival date & time: 11/01/20  0865      History   Chief Complaint Chief Complaint  Patient presents with  . SEXUALLY TRANSMITTED DISEASE    HPI Anita Sims is a 25 y.o. female.   Patient presents today with with recurrent urinary symptoms.  She was seen 10/22/2020 at which point she tested positive for gonorrhea but never returned for treatment.  She also tested positive for bacterial vaginosis and completed course of metronidazole.  She did have improvement of symptoms with this medication regimen but did not have resolution.  She continues to have some urinary symptoms prompting reevaluation today.  She reports urinary frequency, urinary urgency, dysuria.  Denies hematuria, abdominal pain, pelvic pain, fever, nausea, vomiting.  She has no concern for pregnancy but is open to testing today.  Reports she had a tubal ligation but she is concerned because her menstrual cycle is abnormal and is requesting urine pregnancy test.  She denies any recent antibiotic use.  Denies history of single kidney, self-catheterization, history of nephrolithiasis.   Past Medical History:  Diagnosis Date  . Anemia    hx of, but not during pregnancy  . Anxiety    "was on medication long time ago"; pt states  . Bacterial vaginosis   . Bipolar 1 disorder (HCC)    "was on medication a long time ago" pt states  . Chlamydia   . Genital herpes   . Gonorrhea   . Substance abuse [facility])     Patient Active Problem List   Diagnosis Date Noted  . Pelvic pain in female 03/14/2020  . Trichomonas contact, treated 03/14/2020  . Screening examination for STD (sexually transmitted disease) 03/14/2020  . Status post cesarean delivery 11/26/2019  . Unwanted fertility 10/19/2019  . Marijuana user 05/16/2019  . Herpes simplex type 2 (HSV-2) infection affecting pregnancy, antepartum 05/16/2019  . Miscarriage within last 12 months 03/14/2019  . Polysubstance abuse  (HCC) 09/01/2017    Past Surgical History:  Procedure Laterality Date  . CESAREAN SECTION  11/26/2019   Procedure: CESAREAN SECTION;  Surgeon: [place] Bing, MD;  Location: [facility] LD ORS;  Service: Obstetrics;;  . MOUTH SURGERY    . TUBAL LIGATION      OB History     Gravida  4   Para  3   Term  3   Preterm  0   AB  1   Living  3      SAB  1   IAB  0   Ectopic  0   Multiple  0   Live Births  3            Home Medications    Prior to Admission medications   Medication Sig Start Date End Date Taking? Authorizing Provider  metroNIDAZOLE (FLAGYL) 500 MG tablet Take 1 tablet (500 mg total) by mouth 2 (two) times daily. 10/22/20   Particia Nearing, PA-C  ondansetron (ZOFRAN) 4 MG tablet Take 1 tablet (4 mg total) by mouth every 8 (eight) hours as needed for nausea or vomiting. Patient not taking: No sig reported 07/11/20   Leftwich-Kirby, Wilmer Floor, CNM  valACYclovir (VALTREX) 500 MG tablet Take 1 tablet (500 mg total) by mouth 2 (two) times daily. Patient not taking: No sig reported 10/19/19   Conan Bowens, MD  valACYclovir (VALTREX) 500 MG tablet Take 1 tablet (500 mg total) by mouth daily. Patient not taking: No sig reported 04/17/20  Constant, Peggy, MD  loratadine (CLARITIN) 10 MG tablet Take 1 tablet (10 mg total) by mouth daily. Patient not taking: No sig reported 08/10/19 08/17/20  Brock Bad, MD    Family History Family History  Problem Relation Age of Onset  . Diabetes Maternal Grandfather        type II  . Cancer Maternal Grandfather   . Heart disease Maternal Grandmother   . Heart disease Mother     Social History Social History   Tobacco Use  . Smoking status: Former    Types: Cigars  . Smokeless tobacco: Never  . Tobacco comments:    Hookah  Vaping Use  . Vaping Use: Never used  Substance Use Topics  . Alcohol use: Not Currently    Comment: last use 02/12/2019  . Drug use: Yes    Types: Marijuana    Comment: occasionally      Allergies   Patient has no known allergies.   Review of Systems Review of Systems  Constitutional:  Negative for activity change, appetite change, fatigue and fever.  Respiratory:  Negative for cough and shortness of breath.   Cardiovascular:  Negative for chest pain.  Gastrointestinal:  Negative for abdominal pain, diarrhea, nausea and vomiting.  Genitourinary:  Positive for dysuria, frequency, menstrual problem (irregular), urgency and vaginal discharge. Negative for flank pain, pelvic pain, vaginal bleeding and vaginal pain.  Musculoskeletal:  Negative for arthralgias and myalgias.  Neurological:  Negative for dizziness, light-headedness and headaches.    Physical Exam Triage Vital Signs ED Triage Vitals  Enc Vitals Group     BP 11/01/20 1023 120/90     Pulse Rate 11/01/20 1023 67     Resp 11/01/20 1023 18     Temp 11/01/20 1023 98 F (36.7 C)     Temp Source 11/01/20 1023 Oral     SpO2 11/01/20 1023 99 %     Weight --      Height --      Head Circumference --      Peak Flow --      Pain Score 11/01/20 1021 7     Pain Loc --      Pain Edu? --      Excl. in GC? --    No data found.  Updated Vital Signs BP 120/90 (BP Location: Left Arm)   Pulse 67   Temp 98 F (36.7 C) (Oral)   Resp 18   LMP 10/15/2020 (Exact Date)   SpO2 99%   Visual Acuity Right Eye Distance:   Left Eye Distance:   Bilateral Distance:    Right Eye Near:   Left Eye Near:    Bilateral Near:     Physical Exam Vitals reviewed.  Constitutional:      General: She is awake. She is not in acute distress.    Appearance: Normal appearance. She is normal weight. She is not ill-appearing.     Comments: Very pleasant female appears to be age in no acute distress sitting comfortably in exam room  HENT:     Head: Normocephalic and atraumatic.  Cardiovascular:     Rate and Rhythm: Normal rate and regular rhythm.     Heart sounds: Normal heart sounds, S1 normal and S2 normal. No murmur  heard. Pulmonary:     Effort: Pulmonary effort is normal.     Breath sounds: Normal breath sounds. No wheezing, rhonchi or rales.     Comments: Clear to auscultation bilaterally Abdominal:  General: Bowel sounds are normal.     Palpations: Abdomen is soft.     Tenderness: There is no abdominal tenderness. There is no right CVA tenderness, left CVA tenderness, guarding or rebound.     Comments: Benign abdominal exam  Genitourinary:    Comments: Exam deferred Psychiatric:        Behavior: Behavior is cooperative.     UC Treatments / Results  Labs (all labs ordered are listed, but only abnormal results are displayed) Labs Reviewed  POCT URINE PREGNANCY - Normal  POCT URINALYSIS DIP (MANUAL ENTRY)  CERVICOVAGINAL ANCILLARY ONLY    EKG   Radiology No results found.  Procedures Procedures (including critical care time)  Medications Ordered in UC Medications  cefTRIAXone (ROCEPHIN) injection 500 mg (500 mg Intramuscular Given 11/01/20 1054)    Initial Impression / Assessment and Plan / UC Course  I have reviewed the triage vital signs and the nursing notes.  Pertinent labs & imaging results that were available during my care of the patient were reviewed by me and considered in my medical decision making (see chart for details).     UA obtained was normal.  Urine pregnancy was negative.  Discussed that symptoms are likely related to untreated gonorrhea infection.  Patient was treated today and instructed to abstain from sexual intercourse for 7 days from treatment.  Discussed the importance of all partners being tested and treated as well.  Discussed the importance of safe sex practices.  STI swab collected today to ensure she has not contracted any additional conditions and that bacterial vaginosis has resolved; expect this to be positive for gonorrhea given she was only treated today.  Discussed alarm symptoms that warrant emergent evaluation.  Strict return precautions  given to which patient expressed understanding.  Final Clinical Impressions(s) / UC Diagnoses   Final diagnoses:  Gonorrhea  Urinary urgency  Urinary frequency     Discharge Instructions      Your urine was normal today.  I suspect your ongoing symptoms are related to untreated gonorrhea.  We treated you for this today.  Please abstain from sex for 7 days.  All partners need to be tested and treated as he can get this again in the future.  It is important to use condoms with all sexual partners.  If you do not have significant improvement of symptoms within a few days please return for reevaluation.  If you develop any belly pain, fever, abdominal pain, nausea, vomiting you need to be seen immediately.     ED Prescriptions   None    PDMP not reviewed this encounter.   Jeani Hawking, PA-C 11/01/20 1113

## 2020-11-01 NOTE — Discharge Instructions (Addendum)
Your urine was normal today.  I suspect your ongoing symptoms are related to untreated gonorrhea.  We treated you for this today.  Please abstain from sex for 7 days.  All partners need to be tested and treated as he can get this again in the future.  It is important to use condoms with all sexual partners.  If you do not have significant improvement of symptoms within a few days please return for reevaluation.  If you develop any belly pain, fever, abdominal pain, nausea, vomiting you need to be seen immediately.

## 2020-11-02 ENCOUNTER — Telehealth: Payer: Self-pay

## 2020-11-02 LAB — CERVICOVAGINAL ANCILLARY ONLY
Bacterial Vaginitis (gardnerella): POSITIVE — AB
Candida Glabrata: NEGATIVE
Candida Vaginitis: NEGATIVE
Chlamydia: NEGATIVE
Comment: NEGATIVE
Comment: NEGATIVE
Comment: NEGATIVE
Comment: NEGATIVE
Comment: NEGATIVE
Comment: NORMAL
Neisseria Gonorrhea: NEGATIVE
Trichomonas: NEGATIVE

## 2020-11-02 MED ORDER — METRONIDAZOLE 500 MG PO TABS: 500.0000 mg | ORAL_TABLET | Freq: Two times a day (BID) | ORAL | 0 refills | Status: DC

## 2020-11-26 ENCOUNTER — Ambulatory Visit
Admission: EM | Admit: 2020-11-26 | Discharge: 2020-11-26 | Disposition: A | Discharge status: Home / Self Care | Payer: BC Managed Care – PPO | Attending: Emergency Medicine | Admitting: Emergency Medicine

## 2020-11-26 ENCOUNTER — Other Ambulatory Visit: Payer: Self-pay

## 2020-11-26 ENCOUNTER — Encounter: Payer: Self-pay | Admitting: Emergency Medicine

## 2020-11-26 DIAGNOSIS — A6000 Herpesviral infection of urogenital system, unspecified: Secondary | ICD-10-CM | POA: Diagnosis present

## 2020-11-26 DIAGNOSIS — N898 Other specified noninflammatory disorders of vagina: Secondary | ICD-10-CM | POA: Insufficient documentation

## 2020-11-26 MED ORDER — VALACYCLOVIR HCL 1 G PO TABS: 1000.0000 mg | ORAL_TABLET | Freq: Three times a day (TID) | ORAL | 0 refills | Status: AC

## 2020-11-26 MED ORDER — METRONIDAZOLE 500 MG PO TABS
500.0000 mg | ORAL_TABLET | Freq: Two times a day (BID) | ORAL | 0 refills | Status: DC
Start: 2020-11-26 — End: 2020-12-14

## 2020-11-26 NOTE — ED Triage Notes (Signed)
Hx of herpes, asking for valtrex for a recent outbreak. Wants to be checked for BV and STDs today.

## 2020-11-26 NOTE — Discharge Instructions (Addendum)
Take valtrex three times a day for the next 7 days for herpes simplex outbreak   Take metronidazole twice a day for 7 days for treatment of possible bacterial vaginosis infection, do not drink alcohol while using, this will make you feel sick  Labs pending 2-3 days, you will be contacted if positive for any sti and treatment will be sent to the pharmacy, you will have to return to the clinic if positive for gonorrhea to receive treatment   Please refrain from having sex until labs results, please refrain from having sex until treatment complete and symptoms resolve   If positive for Chlamydia  gonorrhea or trichomoniasis please notify partner or partners so they may tested as well, encourage partners to monitor for new rashes or lesions, transmission of herpes is always increased when not using condoms whether outbreak is present or not   Moving forward, it is recommended you use some form of protection against the transmission of sti infections  such as condoms or dental dams with each sexual encounter

## 2020-11-26 NOTE — ED Provider Notes (Signed)
[name]    CSN: [id number] Arrival date & time: 11/26/20  0930      History   Chief Complaint Chief Complaint  Patient presents with   Herpes Zoster   SEXUALLY TRANSMITTED DISEASE    HPI Anita Sims is a 25 y.o. female.   Patient presents with thin cloudy Anita Sims discharge for 4 days. Has blister like rash present on right labia present for 4 days as well. Denies itching, odor, urinary frequency, urgency abdominal pain , flank pain, hematuria. History of genital herpes, requesting valtrex for treatment. Sexually active, 2 partners, no condom use.  Past Medical History:  Diagnosis Date   Anemia    hx of, but not during pregnancy   Anxiety    "was on medication long time ago"; pt states   Bacterial vaginosis    Bipolar 1 disorder (HCC)    "was on medication a long time ago" pt states   Chlamydia    Genital herpes    Gonorrhea    Substance abuse [facility])     Patient Active Problem List   Diagnosis Date Noted   Pelvic pain in female 03/14/2020   Trichomonas contact, treated 03/14/2020   Screening examination for STD (sexually transmitted disease) 03/14/2020   Status post cesarean delivery 11/26/2019   Unwanted fertility 10/19/2019   Marijuana user 05/16/2019   Herpes simplex type 2 (HSV-2) infection affecting pregnancy, antepartum 05/16/2019   Miscarriage within last 12 months 03/14/2019   Polysubstance abuse (HCC) 09/01/2017    Past Surgical History:  Procedure Laterality Date   CESAREAN SECTION  11/26/2019   Procedure: CESAREAN SECTION;  Surgeon: [place] Bing, MD;  Location: [facility] LD ORS;  Service: Obstetrics;;   MOUTH SURGERY     TUBAL LIGATION      OB History     Gravida  4   Para  3   Term  3   Preterm  0   AB  1   Living  3      SAB  1   IAB  0   Ectopic  0   Multiple  0   Live Births  3            Home Medications    Prior to Admission medications   Medication Sig Start Date End Date Taking?  Authorizing Provider  valACYclovir (VALTREX) 1000 MG tablet Take 1 tablet (1,000 mg total) by mouth 3 (three) times daily for 7 days. 11/26/20 12/03/20 Yes Chirsty Armistead, Elita Boone, NP  metroNIDAZOLE (FLAGYL) 500 MG tablet Take 1 tablet (500 mg total) by mouth 2 (two) times daily. 11/26/20   Saajan Willmon, Elita Boone, NP  ondansetron (ZOFRAN) 4 MG tablet Take 1 tablet (4 mg total) by mouth every 8 (eight) hours as needed for nausea or vomiting. Patient not taking: No sig reported 07/11/20   Leftwich-Kirby, Misty Stanley A, CNM  loratadine (CLARITIN) 10 MG tablet Take 1 tablet (10 mg total) by mouth daily. Patient not taking: No sig reported 08/10/19 08/17/20  Brock Bad, MD    Family History Family History  Problem Relation Age of Onset   Diabetes Maternal Grandfather        type II   Cancer Maternal Grandfather    Heart disease Maternal Grandmother    Heart disease Mother     Social History Social History   Tobacco Use   Smoking status: Former    Types: Cigars   Smokeless tobacco: Never   Tobacco comments:    Hookah  Vaping Use   Vaping Use: Never used  Substance Use Topics   Alcohol use: Not Currently    Comment: last use 02/12/2019   Drug use: Yes    Types: Marijuana    Comment: occasionally     Allergies   Patient has no known allergies.   Review of Systems Review of Systems  Constitutional: Negative.   Genitourinary:  Positive for genital sores and vaginal discharge. Negative for decreased urine volume, difficulty urinating, dyspareunia, dysuria, enuresis, flank pain, frequency, hematuria, menstrual problem, pelvic pain, urgency, vaginal bleeding and vaginal pain.  Skin: Negative.     Physical Exam Triage Vital Signs ED Triage Vitals  Enc Vitals Group     BP 11/26/20 1013 115/74     Pulse Rate 11/26/20 1013 70     Resp 11/26/20 1013 16     Temp 11/26/20 1013 97.8 F (36.6 C)     Temp Source 11/26/20 1013 Oral     SpO2 11/26/20 1013 99 %     Weight --      Height --       Head Circumference --      Peak Flow --      Pain Score 11/26/20 1014 0     Pain Loc --      Pain Edu? --      Excl. in GC? --    No data found.  Updated Vital Signs BP 115/74 (BP Location: Left Arm)   Pulse 70   Temp 97.8 F (36.6 C) (Oral)   Resp 16   SpO2 99%   Visual Acuity Right Eye Distance:   Left Eye Distance:   Bilateral Distance:    Right Eye Near:   Left Eye Near:    Bilateral Near:     Physical Exam Constitutional:      Appearance: She is normal weight.  HENT:     Head: Normocephalic.  Eyes:     Extraocular Movements: Extraocular movements intact.  Pulmonary:     Effort: Pulmonary effort is normal.  Genitourinary:    Comments: Deferred, self collect vaginal swab  Neurological:     Mental Status: She is alert and oriented to person, place, and time. Mental status is at baseline.  Psychiatric:        Mood and Affect: Mood normal.        Behavior: Behavior normal.     UC Treatments / Results  Labs (all labs ordered are listed, but only abnormal results are displayed) Labs Reviewed  CERVICOVAGINAL ANCILLARY ONLY    EKG   Radiology No results found.  Procedures Procedures (including critical care time)  Medications Ordered in UC Medications - No data to display  Initial Impression / Assessment and Plan / UC Course  I have reviewed the triage vital signs and the nursing notes.  Pertinent labs & imaging results that were available during my care of the patient were reviewed by me and considered in my medical decision making (see chart for details).  Genital herpes Vaginal discharge  Sti screen pending, declined HIV and syphilis testing, will treat per protocol Valtrex 1 g tid for 7 days Metronidazole 500 mg bid for 7 days Advised abstinence until all treatment is complete and symptoms resolve Advised use of condoms moving forward to sti prevention  Final Clinical Impressions(s) / UC Diagnoses   Final diagnoses:  Herpes simplex   Vaginal discharge     Discharge Instructions      Take valtrex three times a  day for the next 7 days for herpes simplex outbreak   Take metronidazole twice a day for 7 days for treatment of possible bacterial vaginosis infection, do not drink alcohol while using, this will make you feel sick  Labs pending 2-3 days, you will be contacted if positive for any sti and treatment will be sent to the pharmacy, you will have to return to the clinic if positive for gonorrhea to receive treatment   Please refrain from having sex until labs results, please refrain from having sex until treatment complete and symptoms resolve   If positive for Chlamydia  gonorrhea or trichomoniasis please notify partner or partners so they may tested as well, encourage partners to monitor for new rashes or lesions, transmission of herpes is always increased when not using condoms whether outbreak is present or not   Moving forward, it is recommended you use some form of protection against the transmission of sti infections  such as condoms or dental dams with each sexual encounter     ED Prescriptions     Medication Sig Dispense Auth. Provider   metroNIDAZOLE (FLAGYL) 500 MG tablet Take 1 tablet (500 mg total) by mouth 2 (two) times daily. 14 tablet Alaira Level R, NP   valACYclovir (VALTREX) 1000 MG tablet Take 1 tablet (1,000 mg total) by mouth 3 (three) times daily for 7 days. 21 tablet Clarie Camey, Elita Boone, NP      PDMP not reviewed this encounter.   Valinda Hoar, NP 11/26/20 1039

## 2020-11-27 LAB — CERVICOVAGINAL ANCILLARY ONLY
Bacterial Vaginitis (gardnerella): POSITIVE — AB
Candida Glabrata: NEGATIVE
Candida Vaginitis: NEGATIVE
Chlamydia: NEGATIVE
Comment: NEGATIVE
Comment: NEGATIVE
Comment: NEGATIVE
Comment: NEGATIVE
Comment: NEGATIVE
Comment: NORMAL
Neisseria Gonorrhea: NEGATIVE
Trichomonas: NEGATIVE

## 2020-12-14 ENCOUNTER — Ambulatory Visit (INDEPENDENT_AMBULATORY_CARE_PROVIDER_SITE_OTHER): Payer: BC Managed Care – PPO

## 2020-12-14 ENCOUNTER — Telehealth: Payer: Self-pay | Admitting: Urology

## 2020-12-14 ENCOUNTER — Other Ambulatory Visit: Payer: Self-pay

## 2020-12-14 ENCOUNTER — Other Ambulatory Visit (HOSPITAL_COMMUNITY)
Admission: RE | Admit: 2020-12-14 | Discharge: 2020-12-14 | Disposition: A | Discharge status: Home / Self Care | Payer: BC Managed Care – PPO | Source: Ambulatory Visit | Attending: Obstetrics and Gynecology | Admitting: Obstetrics and Gynecology

## 2020-12-14 VITALS — BP 114/77 | HR 64

## 2020-12-14 DIAGNOSIS — N898 Other specified noninflammatory disorders of vagina: Secondary | ICD-10-CM

## 2020-12-14 NOTE — Telephone Encounter (Signed)
DOS - 01/02/21    LAPIDUS PROC. INCLUDING BUNIONECTOMY LEFT --- 28297     SPOKE WITH NORMAN B WITH [employer] AND HE STATED THAT FOR CPT CODE 94503 NO PRIOR AUTH IS REQUIRED.  REF # [id number]

## 2020-12-14 NOTE — Progress Notes (Signed)
Agree with A & P. 

## 2020-12-14 NOTE — Progress Notes (Signed)
SUBJECTIVE:  25 y.o. female complains of clear vaginal discharge for 1 day(s). Patient states that she also has vaginal odor and irritations. She request for all STD testing to be completed. Denies abnormal vaginal bleeding or significant pelvic pain or fever. No UTI symptoms. Denies history of known exposure to STD.  Patient's last menstrual period was 12/09/2020.  OBJECTIVE:  She appears well, afebrile. Urine dipstick: not done.  ASSESSMENT:  Vaginal Discharge  Vaginal Odor   PLAN:  GC, chlamydia, trichomonas, BVAG, CVAG probe sent to lab. Treatment: To be determined once lab results are received ROV prn if symptoms persist or worsen.

## 2020-12-18 LAB — CERVICOVAGINAL ANCILLARY ONLY
Bacterial Vaginitis (gardnerella): POSITIVE — AB
Candida Glabrata: POSITIVE — AB
Candida Vaginitis: NEGATIVE
Chlamydia: NEGATIVE
Comment: NEGATIVE
Comment: NEGATIVE
Comment: NEGATIVE
Comment: NEGATIVE
Comment: NEGATIVE
Comment: NORMAL
Neisseria Gonorrhea: NEGATIVE
Trichomonas: NEGATIVE

## 2020-12-20 ENCOUNTER — Telehealth: Payer: Self-pay | Admitting: Obstetrics and Gynecology

## 2020-12-20 MED ORDER — METRONIDAZOLE 500 MG PO TABS: 500.0000 mg | ORAL_TABLET | Freq: Two times a day (BID) | ORAL | 0 refills | Status: DC

## 2020-12-20 NOTE — Telephone Encounter (Signed)
Patient called to check on her treatment for her abnormal labs from 12/14/20.   Flagyl routed to pharmacy per protocol.   Will route message to provider for prescription order for Candida Glabrata.  Patient prefers a pill form if available.

## 2020-12-24 ENCOUNTER — Other Ambulatory Visit: Payer: Self-pay

## 2020-12-24 DIAGNOSIS — B379 Candidiasis, unspecified: Secondary | ICD-10-CM

## 2020-12-24 MED ORDER — TERCONAZOLE 0.4 % VA CREA: 1.0000 | TOPICAL_CREAM | Freq: Every day | VAGINAL | 0 refills | Status: AC

## 2021-01-02 ENCOUNTER — Telehealth: Payer: Self-pay

## 2021-01-02 NOTE — Telephone Encounter (Signed)
Najah no showed for her surgery with Dr. Ardelle Anton.

## 2021-01-03 ENCOUNTER — Other Ambulatory Visit: Payer: Self-pay

## 2021-01-03 MED ORDER — METRONIDAZOLE 500 MG PO TABS: 500.0000 mg | ORAL_TABLET | Freq: Two times a day (BID) | ORAL | 0 refills | Status: DC

## 2021-01-07 ENCOUNTER — Encounter: Payer: BC Managed Care – PPO | Admitting: Podiatry

## 2021-01-17 ENCOUNTER — Other Ambulatory Visit: Payer: Self-pay

## 2021-01-17 ENCOUNTER — Encounter: Payer: BC Managed Care – PPO | Admitting: Podiatry

## 2021-01-17 DIAGNOSIS — B379 Candidiasis, unspecified: Secondary | ICD-10-CM

## 2021-01-21 ENCOUNTER — Other Ambulatory Visit (HOSPITAL_COMMUNITY)
Admission: RE | Admit: 2021-01-21 | Discharge: 2021-01-21 | Disposition: A | Discharge status: Home / Self Care | Payer: BC Managed Care – PPO | Source: Ambulatory Visit | Attending: Obstetrics and Gynecology | Admitting: Obstetrics and Gynecology

## 2021-01-21 ENCOUNTER — Ambulatory Visit (INDEPENDENT_AMBULATORY_CARE_PROVIDER_SITE_OTHER): Payer: BC Managed Care – PPO

## 2021-01-21 ENCOUNTER — Other Ambulatory Visit: Payer: Self-pay

## 2021-01-21 DIAGNOSIS — N898 Other specified noninflammatory disorders of vagina: Secondary | ICD-10-CM | POA: Insufficient documentation

## 2021-01-21 DIAGNOSIS — R102 Pelvic and perineal pain: Secondary | ICD-10-CM

## 2021-01-21 LAB — POCT URINALYSIS DIPSTICK
Bilirubin, UA: NEGATIVE
Blood, UA: NEGATIVE
Glucose, UA: NEGATIVE
Ketones, UA: NEGATIVE
Nitrite, UA: NEGATIVE
Protein, UA: NEGATIVE
Spec Grav, UA: 1.01 (ref 1.010–1.025)
Urobilinogen, UA: 0.2 E.U./dL
pH, UA: 7 (ref 5.0–8.0)

## 2021-01-21 NOTE — Progress Notes (Signed)
Agree with nurses's documentation of this patient's clinic encounter.  Shyia Fillingim L, MD  

## 2021-01-21 NOTE — Progress Notes (Signed)
..  SUBJECTIVE:  25 y.o. female complains of curd-like vaginal discharge for 3 day(s) with odor Denies abnormal vaginal bleeding but reports pelvic pain. No UTI symptoms. Denies history of known exposure to STD.  LMP 01-08-21  OBJECTIVE:  She appears well, afebrile. Urine dipstick: positive for leukocytes.  ASSESSMENT:  Vaginal Discharge  Vaginal Odor Pelvic pain   PLAN:  GC, chlamydia, trichomonas, BVAG, CVAG probe and urine culture sent to lab.Treatment: To be determined once lab results are received ROV prn if symptoms persist or worsen.

## 2021-01-22 LAB — CERVICOVAGINAL ANCILLARY ONLY
Bacterial Vaginitis (gardnerella): POSITIVE — AB
Candida Glabrata: NEGATIVE
Candida Vaginitis: NEGATIVE
Chlamydia: NEGATIVE
Comment: NEGATIVE
Comment: NEGATIVE
Comment: NEGATIVE
Comment: NEGATIVE
Comment: NEGATIVE
Comment: NORMAL
Neisseria Gonorrhea: NEGATIVE
Trichomonas: POSITIVE — AB

## 2021-01-23 ENCOUNTER — Other Ambulatory Visit: Payer: Self-pay | Admitting: *Deleted

## 2021-01-23 LAB — URINE CULTURE

## 2021-01-23 MED ORDER — METRONIDAZOLE 500 MG PO TABS: 500.0000 mg | ORAL_TABLET | Freq: Two times a day (BID) | ORAL | 0 refills | Status: DC

## 2021-01-23 NOTE — Progress Notes (Signed)
Flagyl ordered today for +BV/Trich See lab result

## 2021-01-28 ENCOUNTER — Encounter: Payer: Self-pay | Admitting: Podiatry

## 2021-01-29 ENCOUNTER — Encounter: Payer: BC Managed Care – PPO | Admitting: Podiatry

## 2021-01-31 ENCOUNTER — Encounter: Payer: BC Managed Care – PPO | Admitting: Podiatry

## 2021-02-01 ENCOUNTER — Other Ambulatory Visit: Payer: Self-pay

## 2021-02-01 MED ORDER — METRONIDAZOLE 500 MG PO TABS: 500.0000 mg | ORAL_TABLET | Freq: Two times a day (BID) | ORAL | 0 refills | Status: DC

## 2021-02-01 NOTE — Telephone Encounter (Signed)
Received call from patient, she reported her symptoms are not better she is requesting additional refill.I have advise patient I will call in one more dose to help with symptoms. Patient understand she will need appointment for additional refills.

## 2021-02-04 ENCOUNTER — Other Ambulatory Visit: Payer: Self-pay

## 2021-02-25 ENCOUNTER — Ambulatory Visit (INDEPENDENT_AMBULATORY_CARE_PROVIDER_SITE_OTHER): Payer: BC Managed Care – PPO

## 2021-02-25 ENCOUNTER — Other Ambulatory Visit: Payer: Self-pay

## 2021-02-25 ENCOUNTER — Other Ambulatory Visit (HOSPITAL_COMMUNITY)
Admission: RE | Admit: 2021-02-25 | Discharge: 2021-02-25 | Disposition: A | Discharge status: Home / Self Care | Payer: BC Managed Care – PPO | Source: Ambulatory Visit | Attending: Obstetrics and Gynecology | Admitting: Obstetrics and Gynecology

## 2021-02-25 DIAGNOSIS — Z8619 Personal history of other infectious and parasitic diseases: Secondary | ICD-10-CM | POA: Diagnosis present

## 2021-02-25 DIAGNOSIS — N898 Other specified noninflammatory disorders of vagina: Secondary | ICD-10-CM

## 2021-02-25 DIAGNOSIS — O98519 Other viral diseases complicating pregnancy, unspecified trimester: Secondary | ICD-10-CM

## 2021-02-25 DIAGNOSIS — B009 Herpesviral infection, unspecified: Secondary | ICD-10-CM

## 2021-02-25 MED ORDER — VALACYCLOVIR HCL 500 MG PO TABS: 500.0000 mg | ORAL_TABLET | Freq: Two times a day (BID) | ORAL | 6 refills | Status: DC

## 2021-02-25 NOTE — Progress Notes (Signed)
Patient presents for [facility] for trich infection on 01/21/21. Patient reports some vaginal discharge, no odor, no irritation. Patient also request to have a refill for her valtrex.

## 2021-02-25 NOTE — Progress Notes (Signed)
Patient was assessed and managed by nursing staff during this encounter. I have reviewed the chart and agree with the documentation and plan. I have also made any necessary editorial changes.  Warden Fillers, MD 02/25/2021 1:03 PM

## 2021-02-26 LAB — CERVICOVAGINAL ANCILLARY ONLY
Bacterial Vaginitis (gardnerella): POSITIVE — AB
Candida Glabrata: NEGATIVE
Candida Vaginitis: NEGATIVE
Chlamydia: NEGATIVE
Comment: NEGATIVE
Comment: NEGATIVE
Comment: NEGATIVE
Comment: NEGATIVE
Comment: NEGATIVE
Comment: NORMAL
Neisseria Gonorrhea: NEGATIVE
Trichomonas: NEGATIVE

## 2021-02-28 ENCOUNTER — Other Ambulatory Visit: Payer: Self-pay

## 2021-02-28 DIAGNOSIS — N760 Acute vaginitis: Secondary | ICD-10-CM

## 2021-02-28 MED ORDER — METRONIDAZOLE 500 MG PO TABS: 500.0000 mg | ORAL_TABLET | Freq: Two times a day (BID) | ORAL | 0 refills | Status: DC

## 2021-03-13 IMAGING — US US OB < 14 WEEKS - US OB TV
1 series · 15 of 28 positions shown · non-contrast
Comparison: April 15, 2018.

CLINICAL DATA: Uncertain dates, first trimester of pregnancy.
History of spontaneous abortion last month.

EXAM:
OBSTETRIC <14 WK US AND TRANSVAGINAL OB US
TECHNIQUE: Both transabdominal and transvaginal ultrasound examinations were
performed for complete evaluation of the gestation as well as the
maternal uterus, adnexal regions, and pelvic cul-de-sac.
Transvaginal technique was performed to assess early pregnancy.

[Series 1: us ob < 14 weeks - us ob tv · 15 of 74 slices shown]
[im 1/74]
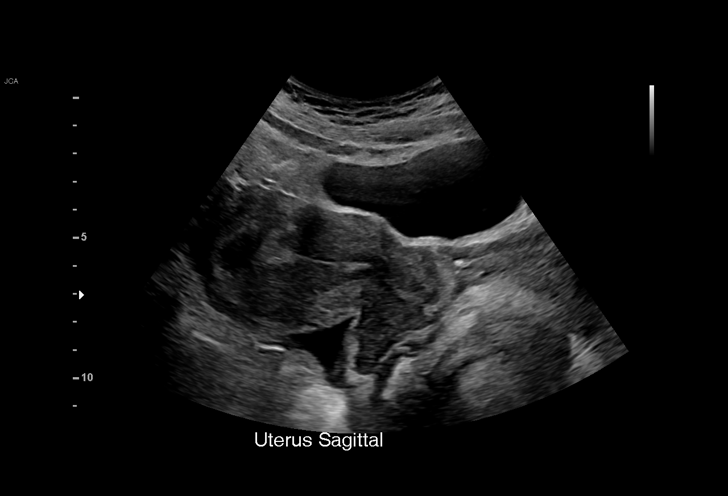
[im 6/74]
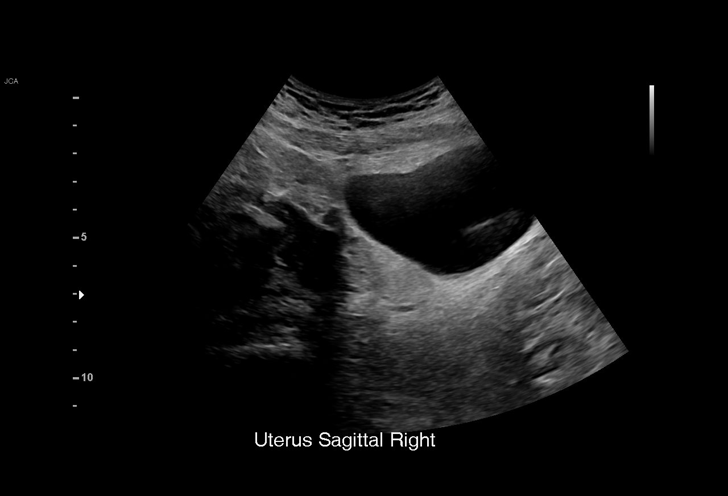
[im 11/74]
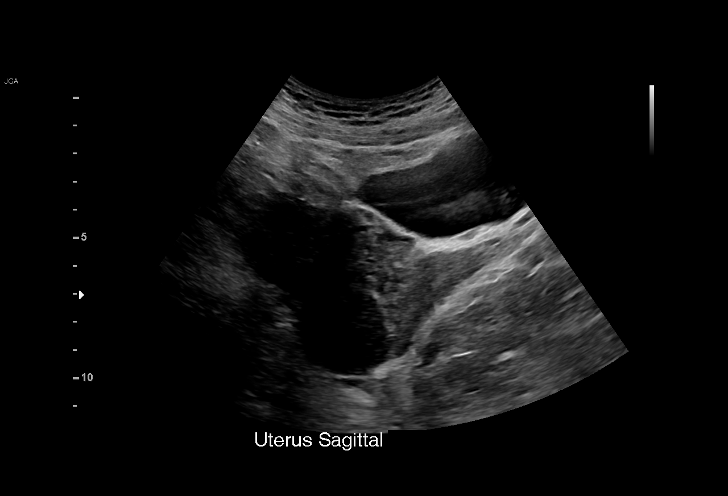
[im 17/74]
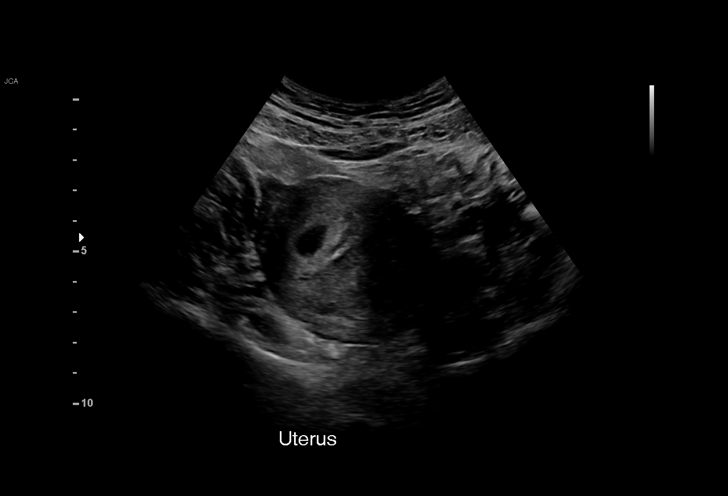
[im 22/74]
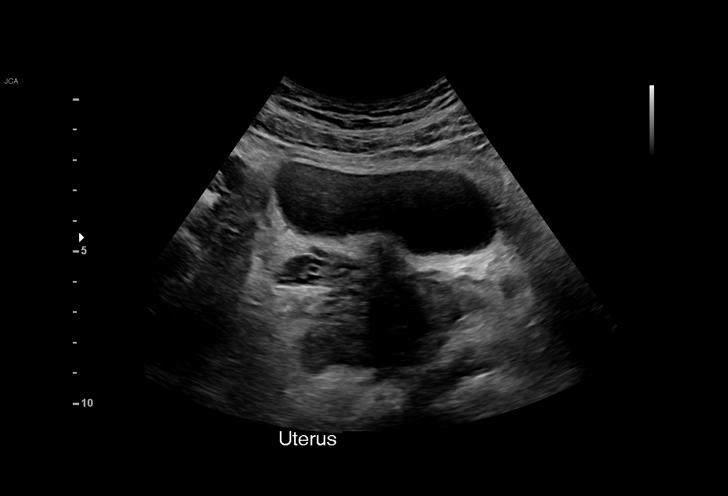
[im 28/74]
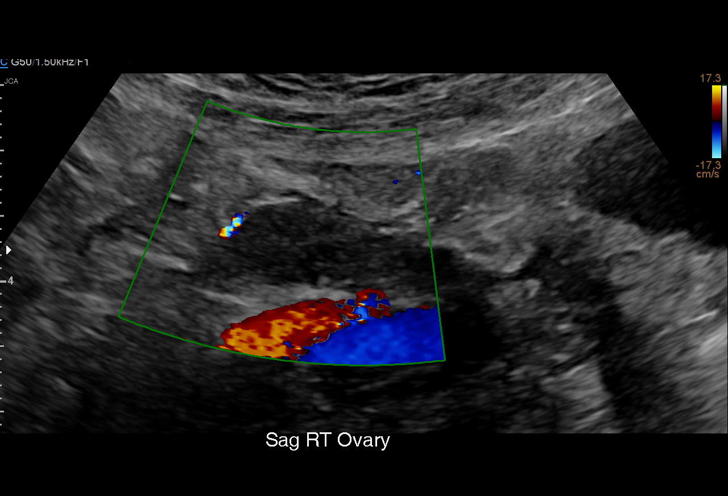
[im 33/74]
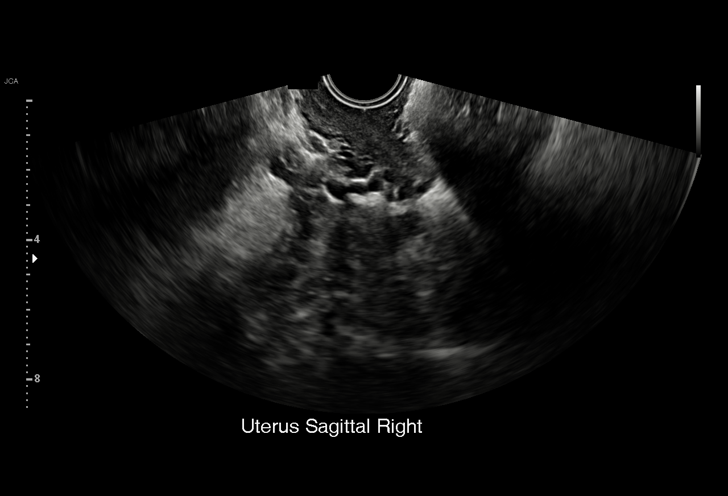
[im 38/74]
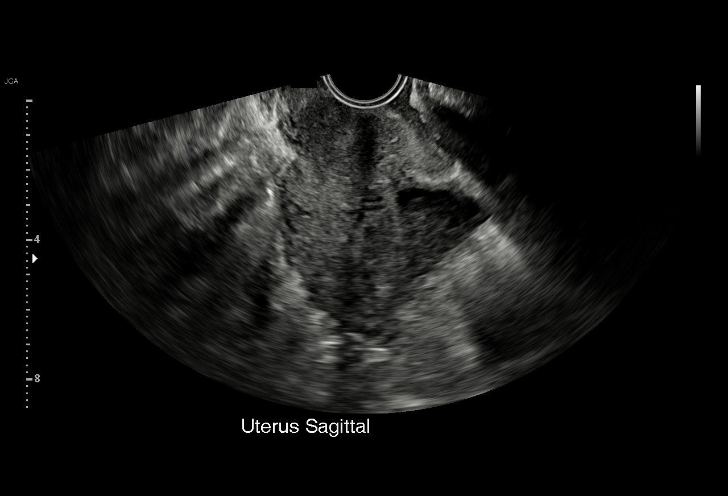
[im 41/74]
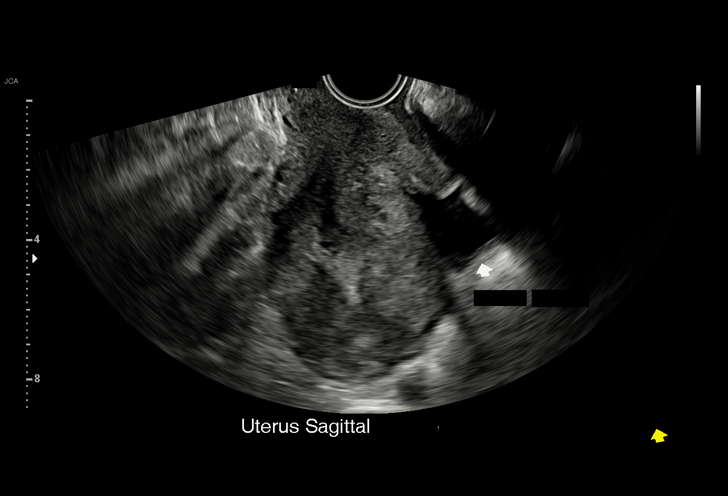
[im 46/74]
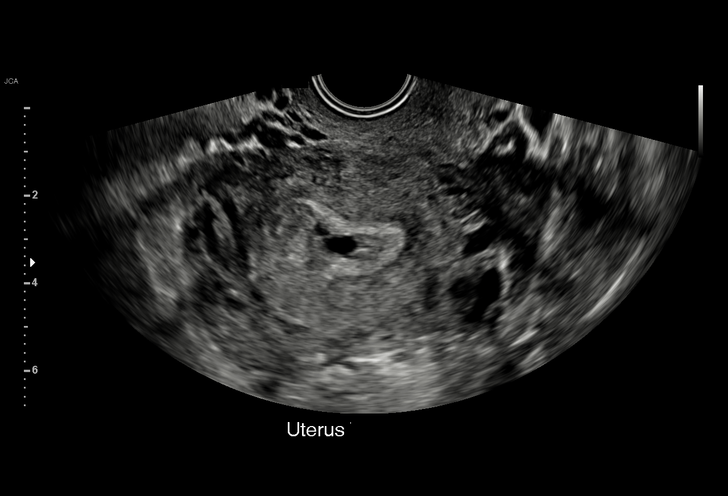
[im 52/74]
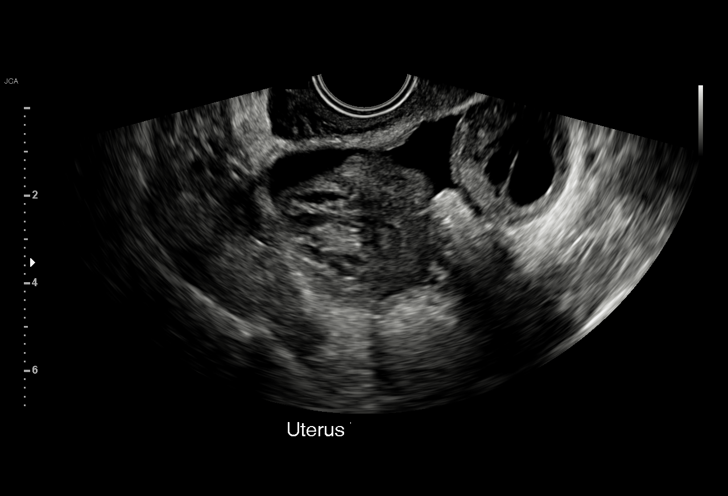
[im 57/74]
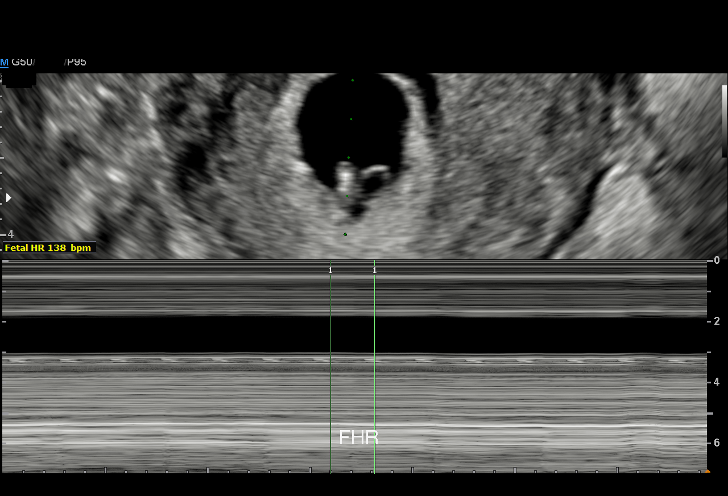
[im 63/74]
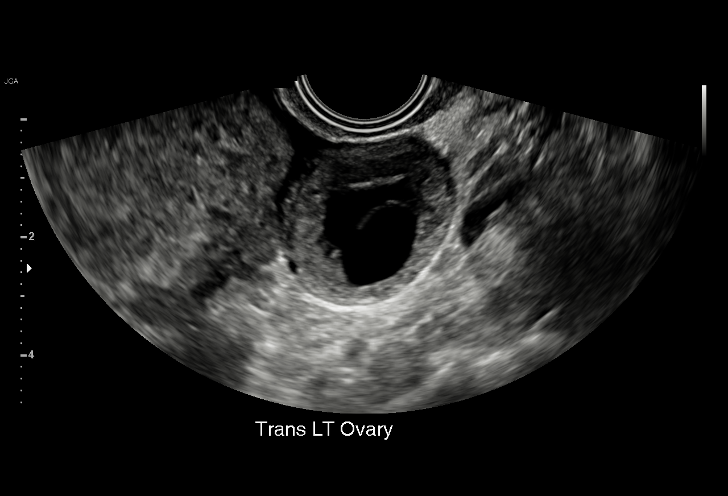
[im 68/74]
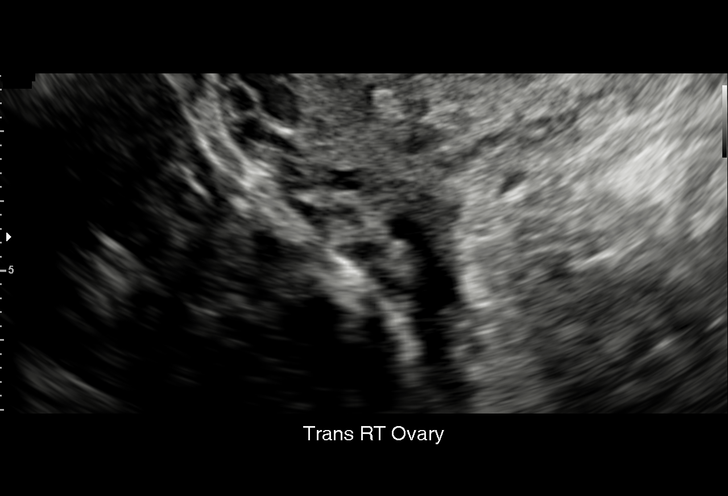
[im 74/74]
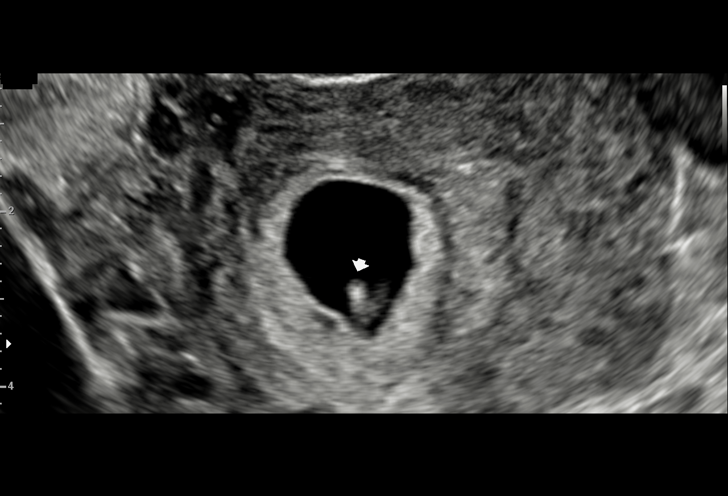

[15 of 28 positions shown; findings below may reference images not displayed]

FINDINGS: Intrauterine gestational sac: Single

Yolk sac:  Visualized.

Embryo:  Visualized.

Cardiac Activity: Visualized.

Heart Rate: 138 bpm

CRL:  5.55 mm   6 w   2 d                  US EDC: December 03, 2019.

Subchorionic hemorrhage:  Small subchronic hemorrhage is noted.

Maternal uterus/adnexae: Probable corpus luteum cyst seen in left
ovary. Right ovary is unremarkable. Small amount of free fluid is
noted which most likely is physiologic.
IMPRESSION: Single live intrauterine gestation of 6 weeks 2 days. Small
subchronic hemorrhage is noted.

## 2021-04-17 ENCOUNTER — Other Ambulatory Visit (HOSPITAL_COMMUNITY)
Admission: RE | Admit: 2021-04-17 | Discharge: 2021-04-17 | Disposition: A | Discharge status: Home / Self Care | Payer: BC Managed Care – PPO | Source: Ambulatory Visit | Attending: Obstetrics and Gynecology | Admitting: Obstetrics and Gynecology

## 2021-04-17 ENCOUNTER — Other Ambulatory Visit: Payer: Self-pay

## 2021-04-17 ENCOUNTER — Ambulatory Visit (INDEPENDENT_AMBULATORY_CARE_PROVIDER_SITE_OTHER): Payer: BC Managed Care – PPO

## 2021-04-17 VITALS — BP 111/74 | HR 57 | Ht 63.0 in | Wt 160.0 lb

## 2021-04-17 DIAGNOSIS — N898 Other specified noninflammatory disorders of vagina: Secondary | ICD-10-CM

## 2021-04-17 DIAGNOSIS — Z113 Encounter for screening for infections with a predominantly sexual mode of transmission: Secondary | ICD-10-CM

## 2021-04-17 NOTE — Progress Notes (Signed)
SUBJECTIVE:  26 y.o. female complains of white vaginal discharge for 7 day(s). Denies abnormal vaginal bleeding or significant pelvic pain or fever. No UTI symptoms. Denies history of known exposure to STD.  Patient's last menstrual period was 03/31/2021 (exact date).  OBJECTIVE:  She appears well, afebrile. Urine dipstick: not done.  ASSESSMENT:  Vaginal Discharge  Vaginal Odor   PLAN:  GC, chlamydia, trichomonas, BVAG, CVAG probe sent to lab. Treatment: To be determined once lab results are received ROV prn if symptoms persist or worsen.

## 2021-04-17 NOTE — Progress Notes (Signed)
Agree with nurses's documentation of this patient's clinic encounter.  Kairos Panetta L, MD  

## 2021-04-18 LAB — CERVICOVAGINAL ANCILLARY ONLY
Bacterial Vaginitis (gardnerella): POSITIVE — AB
Candida Glabrata: NEGATIVE
Candida Vaginitis: NEGATIVE
Chlamydia: NEGATIVE
Comment: NEGATIVE
Comment: NEGATIVE
Comment: NEGATIVE
Comment: NEGATIVE
Comment: NEGATIVE
Comment: NORMAL
Neisseria Gonorrhea: NEGATIVE
Trichomonas: NEGATIVE

## 2021-04-23 ENCOUNTER — Other Ambulatory Visit: Payer: Self-pay | Admitting: *Deleted

## 2021-04-23 DIAGNOSIS — B9689 Other specified bacterial agents as the cause of diseases classified elsewhere: Secondary | ICD-10-CM

## 2021-04-23 MED ORDER — METRONIDAZOLE 500 MG PO TABS: 500.0000 mg | ORAL_TABLET | Freq: Two times a day (BID) | ORAL | 0 refills | Status: DC

## 2021-04-23 NOTE — Progress Notes (Signed)
Flagy sent for BV  See lab results

## 2021-06-01 IMAGING — US US MFM OB COMP +14 WKS
1 series · 13 of 28 positions shown · non-contrast
Comparison: none

[Series 1: us mfm ob comp +14 wks · 102 acquisitions, 13 frames shown]
[im 4/102]
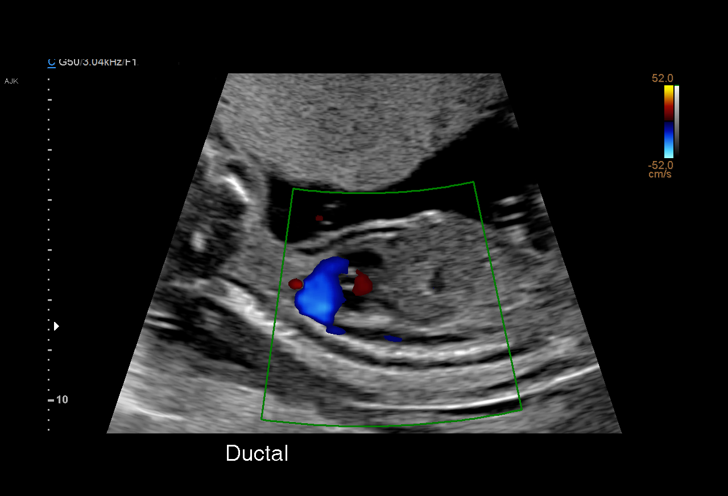
[im 12/102]
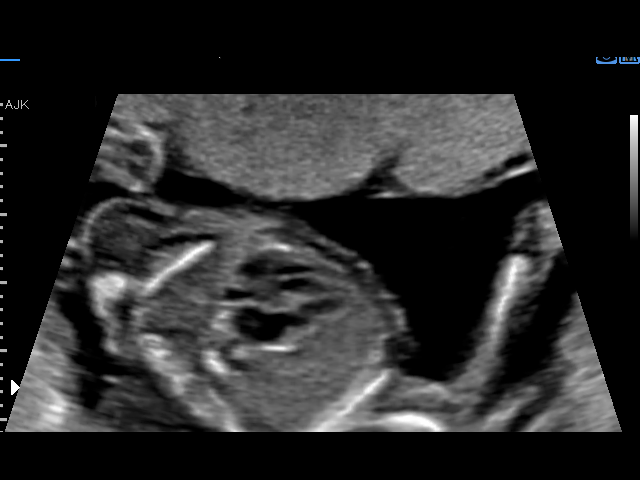
[im 19/102]
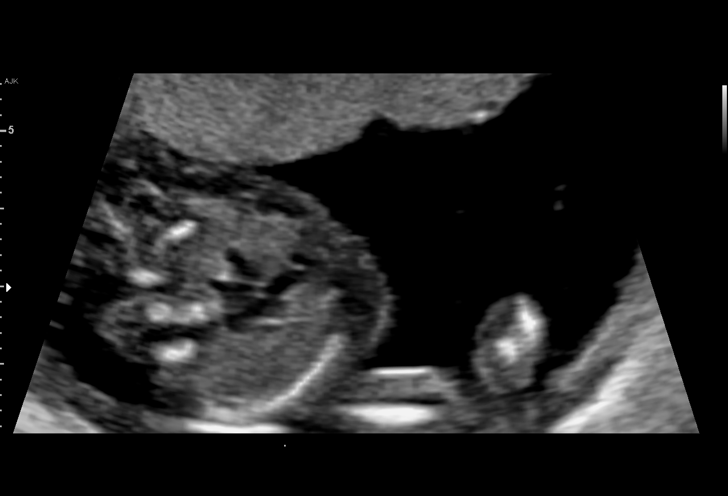
[im 27/102]
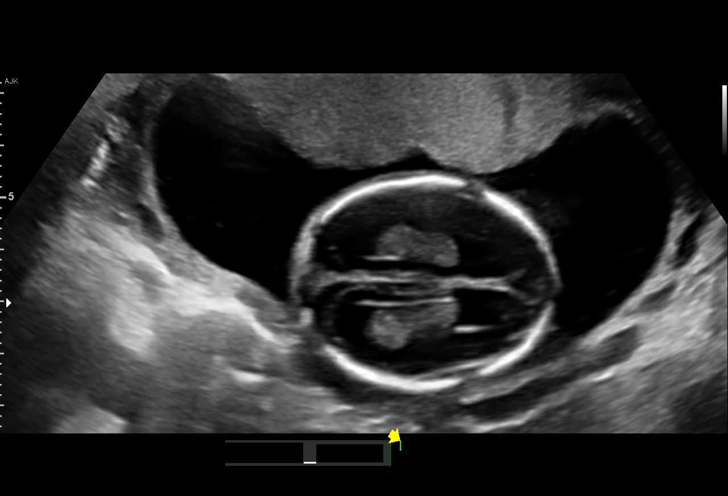
[im 34/102]
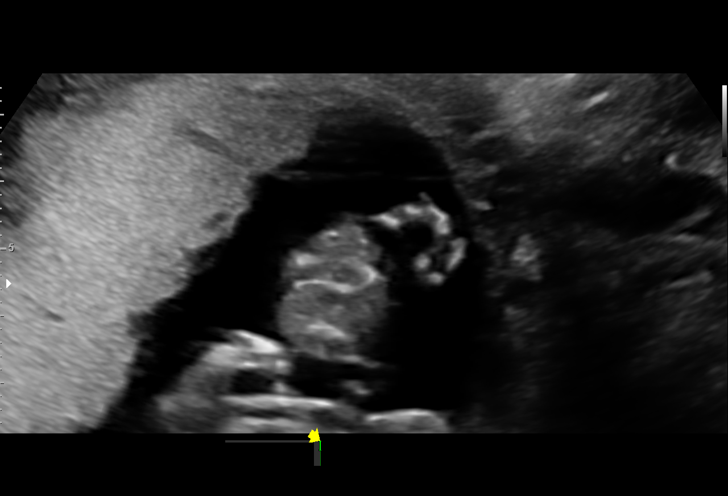
[im 42/102]
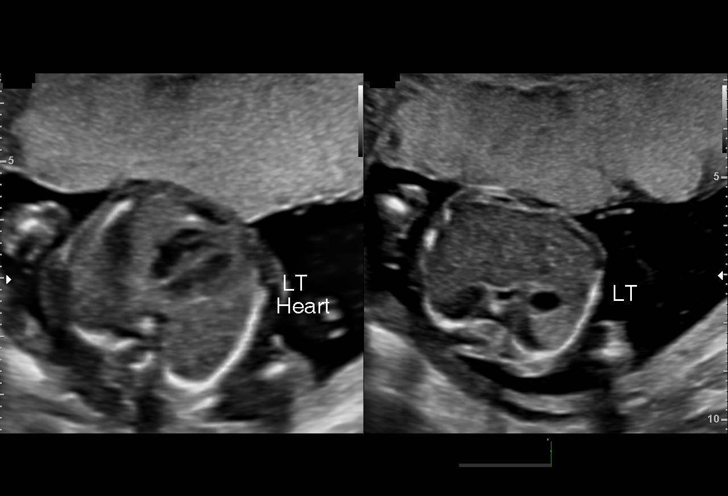
[im 53/102]
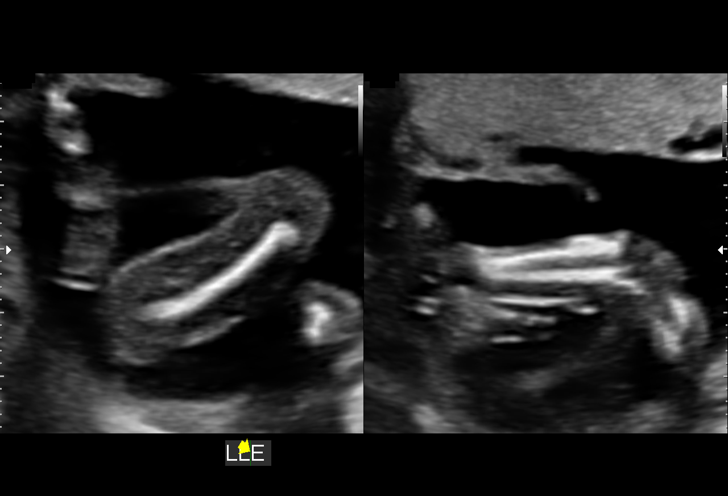
[im 60/102]
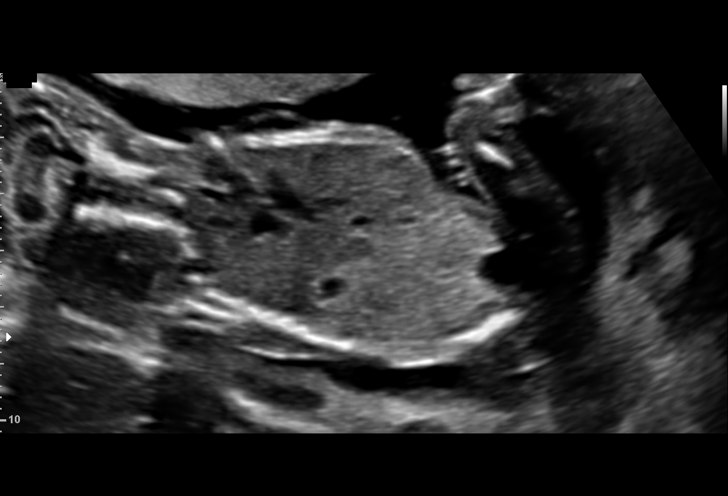
[im 68/102]
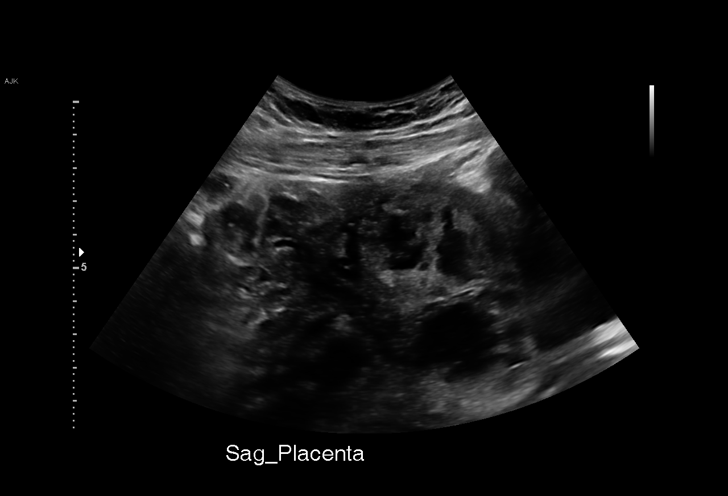
[im 75/102]
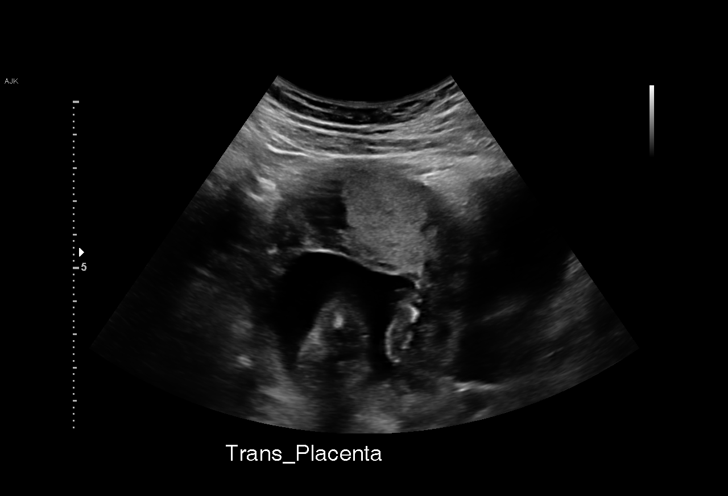
[im 83/102]
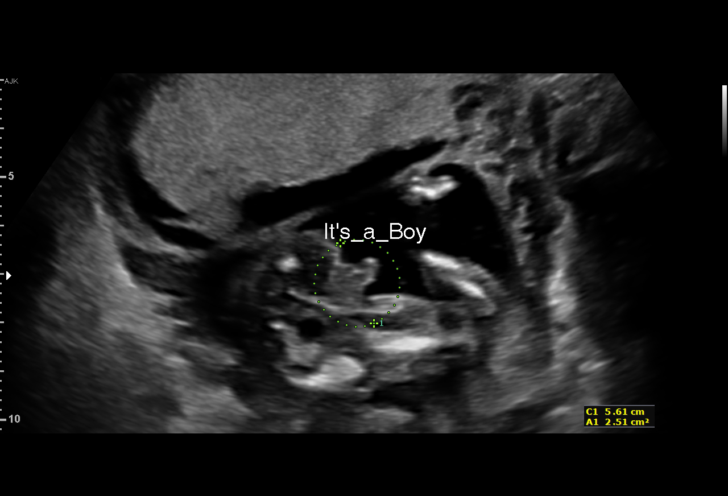
[im 90/102]
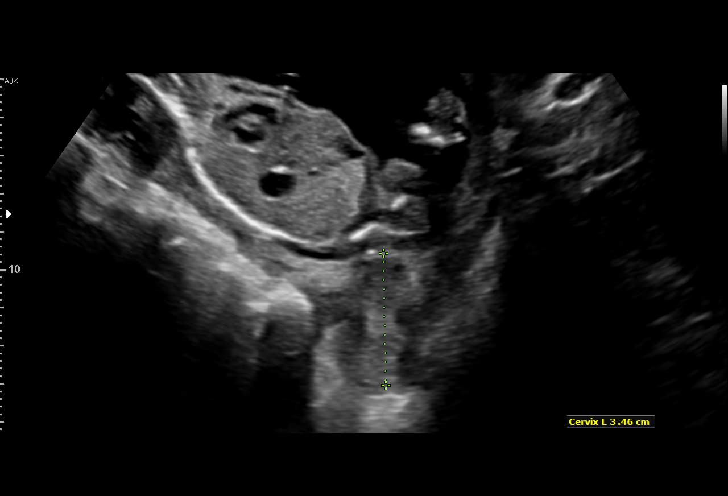
[im 98/102]
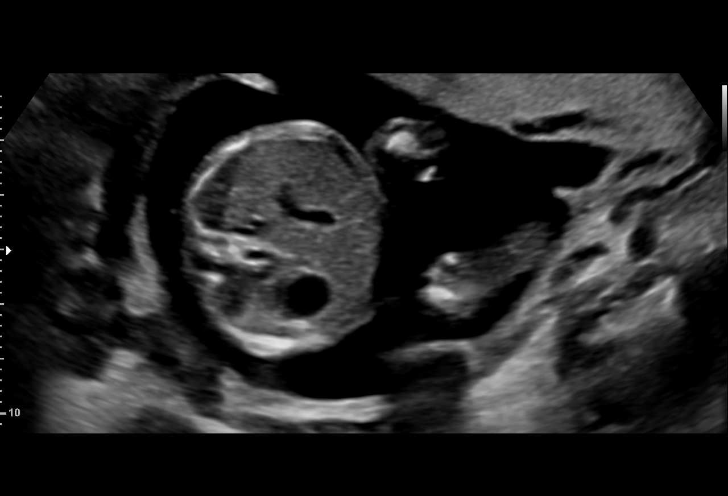

[13 of 28 positions shown; findings below may reference images not displayed]

VAN THE CNM

  1  US MFM OB COMP + 14 WK               76805.01     RIYA WINKEL
 ----------------------------------------------------------------------

 ----------------------------------------------------------------------
Indications

  18 weeks gestation of pregnancy
  Encounter for antenatal screening for
  malformations
  Short interval between pregancies, 2nd
  trimester (vag delivery [DATE])
  Marijuana Use
  Encounter for uncertain dates
 ----------------------------------------------------------------------
Fetal Evaluation

 Num Of Fetuses:         1
 Fetal Heart Rate(bpm):  167
 Cardiac Activity:       Observed
 Presentation:           Breech
 Placenta:               Anterior
 P. Cord Insertion:      Visualized, central

 Amniotic Fluid
 AFI FV:      Within normal limits

                             Largest Pocket(cm)

Biometry

 BPD:      39.7  mm     G. Age:  18w 0d         53  %    CI:        73.27   %    70 - 86
                                                         FL/HC:      17.8   %    15.8 - 18
 HC:      147.4  mm     G. Age:  17w 6d         35  %    HC/AC:      1.08        1.07 -
 AC:      136.4  mm     G. Age:  19w 1d         81  %    FL/BPD:     66.2   %
 FL:       26.3  mm     G. Age:  18w 0d         44  %    FL/AC:      19.3   %    20 - 24
 HUM:      24.9  mm     G. Age:  17w 6d         49  %
 CER:      18.6  mm     G. Age:  18w 2d         59  %
 NFT:       4.6  mm
 CM:        3.8  mm
 Est. FW:     242  gm      0 lb 9 oz     75  %
OB History

 Gravidity:    4         Term:   2         SAB:   1
Gestational Age

 LMP:           18w 0d        Date:  02/24/19                 EDD:   12/01/19
 U/S Today:     18w 2d                                        EDD:   11/29/19
 Best:          18w 0d     Det. By:  LMP  (02/24/19)          EDD:   12/01/19
Anatomy

 Cranium:               Appears normal         LVOT:                   Appears normal
 Cavum:                 Appears normal         Aortic Arch:            Not well visualized
 Ventricles:            Appears normal         Ductal Arch:            Appears normal
 Choroid Plexus:        Appears normal         Diaphragm:              Appears normal
 Cerebellum:            Appears normal         Stomach:                Appears normal, left
                                                                       sided
 Posterior Fossa:       Appears normal         Abdomen:                Appears normal
 Nuchal Fold:           Appears normal         Abdominal Wall:         Appears nml (cord
                                                                       insert, abd wall)
 Face:                  Appears normal         Cord Vessels:           Appears normal (3
                        (orbits and profile)                           vessel cord)
 Lips:                  Appears normal         Kidneys:                Appear normal
 Palate:                Not well visualized    Bladder:                Appears normal
 Thoracic:              Appears normal         Spine:                  Not well visualized
 Heart:                 Appears normal         Upper Extremities:      Appears normal
                        (4CH, axis, and
                        situs)
 RVOT:                  Appears normal         Lower Extremities:      Appears normal

 Other:  Fetus appears to be a male. Heels visualized. Nasal bone visualized.
Cervix Uterus Adnexa

 Cervix
 Length:           3.33  cm.
 Normal appearance by transabdominal scan.

 Uterus
 No abnormality visualized.

 Left Ovary
 Not visualized.

 Right Ovary
 Not visualized.

 Adnexa
 No abnormality visualized.
Impression

 G4 P2. Patient is here for fetal anatomy scan. She had short
 interval between pregnancies. Patient had opted not to
 screen for fetal aneuploidies.

 We performed fetal anatomy scan. No makers of
 aneuploidies or fetal structural defects are seen. Fetal
 biometry is consistent with her previously-established dates.
 Amniotic fluid is normal and good fetal activity is seen.
 Patient understands the limitations of ultrasound in detecting
 fetal anomalies.
Recommendations

 -An appointment was made for her to return in 4 weeks for
 completion of fetal anatomy.
                 Rauan, Kai Uwe

## 2021-06-10 ENCOUNTER — Other Ambulatory Visit: Payer: Self-pay

## 2021-06-10 ENCOUNTER — Ambulatory Visit (INDEPENDENT_AMBULATORY_CARE_PROVIDER_SITE_OTHER): Payer: BC Managed Care – PPO

## 2021-06-10 ENCOUNTER — Other Ambulatory Visit (HOSPITAL_COMMUNITY)
Admission: RE | Admit: 2021-06-10 | Discharge: 2021-06-10 | Disposition: A | Discharge status: Home / Self Care | Payer: BC Managed Care – PPO | Source: Ambulatory Visit | Attending: Obstetrics and Gynecology | Admitting: Obstetrics and Gynecology

## 2021-06-10 VITALS — BP 123/77 | HR 86 | Wt 160.0 lb

## 2021-06-10 DIAGNOSIS — N898 Other specified noninflammatory disorders of vagina: Secondary | ICD-10-CM | POA: Insufficient documentation

## 2021-06-10 NOTE — Progress Notes (Signed)
Patient was assessed and managed by nursing staff during this encounter. I have reviewed the chart and agree with the documentation and plan. I have also made any necessary editorial changes. ° °Delawrence Fridman, MD °06/10/2021 12:32 PM  ° °

## 2021-06-10 NOTE — Progress Notes (Signed)
SUBJECTIVE:  26 y.o. female complains of white vaginal discharge for 1 week. Denies abnormal vaginal bleeding or significant pelvic pain or fever. No UTI symptoms. Denies history of known exposure to STD. Declines STD testing bloodwork.   Pap due 05/15/22   OBJECTIVE:  She appears well, afebrile. Urine dipstick: not done.  ASSESSMENT:  Vaginal Discharge  Vaginal Odor   PLAN:  GC, chlamydia, trichomonas, BVAG, CVAG probe sent to lab. Treatment: To be determined once lab results are reviewed by the provider.  ROV prn if symptoms persist or worsen.

## 2021-06-11 LAB — CERVICOVAGINAL ANCILLARY ONLY
Bacterial Vaginitis (gardnerella): POSITIVE — AB
Candida Glabrata: NEGATIVE
Candida Vaginitis: NEGATIVE
Chlamydia: NEGATIVE
Comment: NEGATIVE
Comment: NEGATIVE
Comment: NEGATIVE
Comment: NEGATIVE
Comment: NEGATIVE
Comment: NORMAL
Neisseria Gonorrhea: NEGATIVE
Trichomonas: NEGATIVE

## 2021-06-11 MED ORDER — METRONIDAZOLE 500 MG PO TABS: 500.0000 mg | ORAL_TABLET | Freq: Two times a day (BID) | ORAL | 0 refills | Status: DC

## 2021-06-11 NOTE — Addendum Note (Signed)
Addended by: Catalina Antigua on: 06/11/2021 03:31 PM   Modules accepted: Orders

## 2021-07-01 ENCOUNTER — Other Ambulatory Visit: Payer: Self-pay

## 2021-07-01 ENCOUNTER — Ambulatory Visit (INDEPENDENT_AMBULATORY_CARE_PROVIDER_SITE_OTHER): Payer: BC Managed Care – PPO

## 2021-07-01 ENCOUNTER — Other Ambulatory Visit (HOSPITAL_COMMUNITY)
Admission: RE | Admit: 2021-07-01 | Discharge: 2021-07-01 | Disposition: A | Discharge status: Home / Self Care | Payer: BC Managed Care – PPO | Source: Ambulatory Visit | Attending: Obstetrics and Gynecology | Admitting: Obstetrics and Gynecology

## 2021-07-01 DIAGNOSIS — B9689 Other specified bacterial agents as the cause of diseases classified elsewhere: Secondary | ICD-10-CM | POA: Diagnosis not present

## 2021-07-01 DIAGNOSIS — N760 Acute vaginitis: Secondary | ICD-10-CM | POA: Insufficient documentation

## 2021-07-01 DIAGNOSIS — Z113 Encounter for screening for infections with a predominantly sexual mode of transmission: Secondary | ICD-10-CM

## 2021-07-01 DIAGNOSIS — N898 Other specified noninflammatory disorders of vagina: Secondary | ICD-10-CM

## 2021-07-01 NOTE — Progress Notes (Signed)
SUBJECTIVE:  ?26 y.o. female complains of malodorous vaginal discharge. ?Denies abnormal vaginal bleeding or significant pelvic pain or fever. No UTI symptoms. Denies history of known exposure to STD. ? ?Patient's last menstrual period was 06/03/2021. ? ?OBJECTIVE:  ?She appears well, afebrile. ?Urine dipstick: not done. ? ?ASSESSMENT:  ?Vaginal Discharge  ?Vaginal Odor ? ? ?PLAN:  ?Pt to schedule annual at checkout and to discuss recurrent BV infections  ?GC, chlamydia, trichomonas, BVAG, CVAG probe sent to lab. ?Treatment: To be determined once lab results are received ?ROV prn if symptoms persist or worsen. ? ?

## 2021-07-02 LAB — CERVICOVAGINAL ANCILLARY ONLY
Bacterial Vaginitis (gardnerella): POSITIVE — AB
Candida Glabrata: NEGATIVE
Candida Vaginitis: NEGATIVE
Chlamydia: NEGATIVE
Comment: NEGATIVE
Comment: NEGATIVE
Comment: NEGATIVE
Comment: NEGATIVE
Comment: NEGATIVE
Comment: NORMAL
Neisseria Gonorrhea: NEGATIVE
Trichomonas: NEGATIVE

## 2021-07-02 MED ORDER — CLINDAMYCIN HCL 300 MG PO CAPS: 300.0000 mg | ORAL_CAPSULE | Freq: Two times a day (BID) | ORAL | 0 refills | Status: DC

## 2021-07-02 NOTE — Addendum Note (Signed)
Addended by: Mora Bellman on: 07/02/2021 01:06 PM ? ? Modules accepted: Orders ? ?

## 2021-08-16 ENCOUNTER — Ambulatory Visit (INDEPENDENT_AMBULATORY_CARE_PROVIDER_SITE_OTHER): Payer: BC Managed Care – PPO

## 2021-08-16 ENCOUNTER — Other Ambulatory Visit (HOSPITAL_COMMUNITY)
Admission: RE | Admit: 2021-08-16 | Discharge: 2021-08-16 | Disposition: A | Discharge status: Home / Self Care | Payer: Medicaid Other | Source: Ambulatory Visit | Attending: Obstetrics and Gynecology | Admitting: Obstetrics and Gynecology

## 2021-08-16 VITALS — BP 109/71 | HR 65 | Ht 63.0 in | Wt 162.0 lb

## 2021-08-16 DIAGNOSIS — N898 Other specified noninflammatory disorders of vagina: Secondary | ICD-10-CM

## 2021-08-16 NOTE — Progress Notes (Signed)
Agree with nurses's documentation of this patient's clinic encounter.  Denyla Cortese L, MD  

## 2021-08-16 NOTE — Progress Notes (Signed)
SUBJECTIVE:  ?26 y.o. female complains of yellow vaginal discharge for 3 day(s). ?Denies abnormal vaginal bleeding or significant pelvic pain or ?fever. No UTI symptoms. Denies history of known exposure to STD. ? ?Patient's last menstrual period was 07/30/2021 (exact date). ? ?OBJECTIVE:  ?She appears well, afebrile. ?Urine dipstick: not done. ? ?ASSESSMENT:  ?Yellow Vaginal Discharge  ?Vaginal Odor ? ? ?PLAN:  ?GC, chlamydia, trichomonas, BVAG, CVAG probe sent to lab. ?Treatment: To be determined once lab results are received ?ROV prn if symptoms persist or worsen.  ?

## 2021-08-19 LAB — CERVICOVAGINAL ANCILLARY ONLY
Bacterial Vaginitis (gardnerella): POSITIVE — AB
Candida Glabrata: NEGATIVE
Candida Vaginitis: NEGATIVE
Chlamydia: NEGATIVE
Comment: NEGATIVE
Comment: NEGATIVE
Comment: NEGATIVE
Comment: NEGATIVE
Comment: NEGATIVE
Comment: NORMAL
Neisseria Gonorrhea: NEGATIVE
Trichomonas: NEGATIVE

## 2021-08-20 ENCOUNTER — Other Ambulatory Visit: Payer: Self-pay

## 2021-08-20 MED ORDER — METRONIDAZOLE 500 MG PO TABS: 500.0000 mg | ORAL_TABLET | Freq: Two times a day (BID) | ORAL | 0 refills | Status: DC

## 2021-09-02 ENCOUNTER — Ambulatory Visit (INDEPENDENT_AMBULATORY_CARE_PROVIDER_SITE_OTHER): Payer: Medicaid Other

## 2021-09-02 ENCOUNTER — Other Ambulatory Visit (HOSPITAL_COMMUNITY)
Admission: RE | Admit: 2021-09-02 | Discharge: 2021-09-02 | Disposition: A | Discharge status: Home / Self Care | Payer: Medicaid Other | Source: Ambulatory Visit | Attending: Obstetrics and Gynecology | Admitting: Obstetrics and Gynecology

## 2021-09-02 ENCOUNTER — Telehealth: Payer: Self-pay

## 2021-09-02 VITALS — BP 111/76 | HR 90 | Wt 168.0 lb

## 2021-09-02 DIAGNOSIS — N898 Other specified noninflammatory disorders of vagina: Secondary | ICD-10-CM | POA: Diagnosis present

## 2021-09-02 NOTE — Progress Notes (Signed)
SUBJECTIVE:  26 y.o. female complains of vaginal itching x 2 days. Recent ABx use. Pt also c/o recurrent BV infections. Denies abnormal vaginal bleeding or significant pelvic pain or fever. No UTI symptoms. Denies history of known exposure to STD.  LMP: 08/21/21  OBJECTIVE:  She appears well, afebrile. Urine dipstick: not done.  ASSESSMENT:  Vaginal Discharge    PLAN:  Schedule an appt with a provider to address reoccurring BV infections.  GC, chlamydia, trichomonas, BVAG, CVAG probe sent to lab. Treatment: To be determined once lab results are received ROV prn if symptoms persist or worsen.

## 2021-09-02 NOTE — Telephone Encounter (Signed)
Attempting to reach patient regarding self-swab appt she requested today. Unable to LVM.

## 2021-09-02 NOTE — Progress Notes (Signed)
Patient was assessed and managed by nursing staff during this encounter. I have reviewed the chart and agree with the documentation and plan. I have also made any necessary editorial changes.  Catalina Antigua, MD 09/02/2021 4:31 PM

## 2021-09-03 LAB — CERVICOVAGINAL ANCILLARY ONLY
Bacterial Vaginitis (gardnerella): NEGATIVE
Candida Glabrata: NEGATIVE
Candida Vaginitis: POSITIVE — AB
Comment: NEGATIVE
Comment: NEGATIVE
Comment: NEGATIVE

## 2021-09-04 ENCOUNTER — Other Ambulatory Visit: Payer: Self-pay

## 2021-09-04 DIAGNOSIS — B379 Candidiasis, unspecified: Secondary | ICD-10-CM

## 2021-09-04 MED ORDER — FLUCONAZOLE 150 MG PO TABS: 150.0000 mg | ORAL_TABLET | Freq: Once | ORAL | 0 refills | Status: AC

## 2021-09-05 MED ORDER — FLUCONAZOLE 150 MG PO TABS: 150.0000 mg | ORAL_TABLET | Freq: Once | ORAL | 0 refills | Status: AC

## 2021-09-05 NOTE — Addendum Note (Signed)
Addended by: Catalina Antigua on: 09/05/2021 10:05 AM   Modules accepted: Orders

## 2021-09-23 ENCOUNTER — Other Ambulatory Visit: Payer: Self-pay

## 2021-09-23 ENCOUNTER — Telehealth: Payer: Self-pay

## 2021-09-23 DIAGNOSIS — N898 Other specified noninflammatory disorders of vagina: Secondary | ICD-10-CM

## 2021-09-23 MED ORDER — METRONIDAZOLE 500 MG PO TABS: 500.0000 mg | ORAL_TABLET | Freq: Two times a day (BID) | ORAL | 0 refills | Status: DC

## 2021-09-23 NOTE — Telephone Encounter (Signed)
Pt requesting refill on Flagyl. Informed patient I would refill it one time but needs an appointment with a provider for recurrent BV.  Pt transferred to scheduling.

## 2021-10-09 ENCOUNTER — Ambulatory Visit (INDEPENDENT_AMBULATORY_CARE_PROVIDER_SITE_OTHER): Payer: Medicaid Other | Admitting: Obstetrics and Gynecology

## 2021-10-09 ENCOUNTER — Encounter: Payer: Self-pay | Admitting: Obstetrics and Gynecology

## 2021-10-09 VITALS — BP 113/75 | HR 58 | Ht 63.0 in | Wt 158.0 lb

## 2021-10-09 DIAGNOSIS — N760 Acute vaginitis: Secondary | ICD-10-CM | POA: Diagnosis not present

## 2021-10-09 MED ORDER — FLUCONAZOLE 150 MG PO TABS: 150.0000 mg | ORAL_TABLET | Freq: Once | ORAL | 2 refills | Status: AC

## 2021-10-09 NOTE — Progress Notes (Signed)
Pt is in office for recurring BV, currently taking Flagyl.

## 2021-10-09 NOTE — Progress Notes (Signed)
26 yo P3 presenting today for evaluation of recurrent BV. Patient with recent yeast and BV infection last month. She is still taking flagyl and admits to not taking it as prescribed. She denies any current symptoms. Patient reports vaginitis symptoms seem to follow her period. Patient is without any other complaints. She is sexually active using BTL for contraception.   Past Medical History:  Diagnosis Date   Anemia    hx of, but not during pregnancy   Anxiety    "was on medication long time ago"; pt states   Bacterial vaginosis    Bipolar 1 disorder (HCC)    "was on medication a long time ago" pt states   Chlamydia    Genital herpes    Gonorrhea    Substance abuse (HCC)    Past Surgical History:  Procedure Laterality Date   CESAREAN SECTION  11/26/2019   Procedure: CESAREAN SECTION;  Surgeon:  Bing, MD;  Location: [facility] LD ORS;  Service: Obstetrics;;   MOUTH SURGERY     TUBAL LIGATION     Family History  Problem Relation Age of Onset   Diabetes Maternal Grandfather        type II   Cancer Maternal Grandfather    Heart disease Maternal Grandmother    Heart disease Mother    Social History   Tobacco Use   Smoking status: Former    Types: Cigars   Smokeless tobacco: Never   Tobacco comments:    Hookah  Vaping Use   Vaping Use: Never used  Substance Use Topics   Alcohol use: Not Currently    Comment: last use 02/12/2019   Drug use: Yes    Types: Marijuana    Comment: occasionally   ROS See pertinent in HPI. All other systems reviewed and non contributory Blood pressure 113/75, pulse (!) 58, height 5\' 3"  (1.6 m), weight 158 lb (71.7 kg), last menstrual period 09/13/2021, not currently breastfeeding. GENERAL: Well-developed, well-nourished female in no acute distress.  NEURO: alert and oriented x 3  A/P 26 yo with recurrent BV - discussed perineal care and avoiding scented soaps and laundry detergent. Discussed paying attention to brands of pads/tampons as  recurrence seem to follow menses. - discussed probiotics and boric acid for suppression therapy - Patient verbalized understanding and all questions were answered

## 2021-11-18 ENCOUNTER — Ambulatory Visit
Admission: EM | Admit: 2021-11-18 | Discharge: 2021-11-18 | Disposition: A | Discharge status: Home / Self Care | Payer: Medicaid Other | Attending: Internal Medicine | Admitting: Internal Medicine

## 2021-11-18 ENCOUNTER — Encounter: Payer: Self-pay | Admitting: Emergency Medicine

## 2021-11-18 DIAGNOSIS — N898 Other specified noninflammatory disorders of vagina: Secondary | ICD-10-CM | POA: Insufficient documentation

## 2021-11-18 DIAGNOSIS — Z113 Encounter for screening for infections with a predominantly sexual mode of transmission: Secondary | ICD-10-CM | POA: Diagnosis present

## 2021-11-18 NOTE — ED Provider Notes (Signed)
[name]    CSN: [id number] Arrival date & time: 11/18/21  1005      History   Chief Complaint Chief Complaint  Patient presents with   vaginal odor   SEXUALLY TRANSMITTED DISEASE    HPI Anita Sims is a 26 y.o. female.   Patient presents with vaginal odor that has been present for about 1 to 1.5 weeks.  Denies any associated vaginal discharge, dysuria, urinary frequency, abdominal pain, abnormal vaginal bleeding, pelvic pain, back pain, fever.  Denies any confirmed exposure to STD but has had unprotected sexual intercourse recently.  Patient requesting STD testing but does not want testing for HIV or syphilis.  Patient states she is currently on her menstrual cycle.     Past Medical History:  Diagnosis Date   Anemia    hx of, but not during pregnancy   Anxiety    "was on medication long time ago"; pt states   Bacterial vaginosis    Bipolar 1 disorder (HCC)    "was on medication a long time ago" pt states   Chlamydia    Genital herpes    Gonorrhea    Substance abuse [facility])     Patient Active Problem List   Diagnosis Date Noted   Pelvic pain in female 03/14/2020   Trichomonas contact, treated 03/14/2020   Screening examination for STD (sexually transmitted disease) 03/14/2020   Status post cesarean delivery 11/26/2019   Unwanted fertility 10/19/2019   Marijuana user 05/16/2019   Herpes simplex type 2 (HSV-2) infection affecting pregnancy, 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 Authorizing Provider  metroNIDAZOLE (FLAGYL) 500 MG tablet Take 1 tablet (500 mg total) by mouth 2 (two) times daily. 09/23/21   Brock Bad, MD  ondansetron (ZOFRAN) 4 MG tablet Take 1 tablet (4 mg total) by mouth every 8 (eight) hours as needed for nausea or vomiting. Patient not taking: Reported on 01/21/2021 07/11/20   Hurshel Party, CNM  terconazole (TERAZOL 7) 0.4 % vaginal cream Place 1 applicator vaginally at bedtime. Patient not taking: Reported on 01/21/2021 12/24/20   Hermina Staggers, MD  valACYclovir (VALTREX) 500 MG tablet Take 1 tablet (500 mg total) by mouth 2 (two) times daily. 02/25/21   Warden Fillers, MD  loratadine (CLARITIN) 10 MG tablet Take 1 tablet (10 mg total) by mouth daily. Patient not taking: No sig reported 08/10/19 08/17/20  Brock Bad, MD    Family History Family History  Problem Relation Age of Onset   Diabetes Maternal Grandfather        type II   Cancer Maternal Grandfather  Heart disease Maternal Grandmother    Heart disease Mother     Social History Social History   Tobacco Use   Smoking status: Former    Types: Cigars   Smokeless tobacco: Never   Tobacco comments:    Hookah  Vaping Use   Vaping Use: Never used  Substance Use Topics   Alcohol use: Not Currently    Comment: last use 02/12/2019   Drug use: Yes    Types: Marijuana    Comment: occasionally     Allergies   Patient has no known allergies.   Review of Systems Review of Systems Per HPI  Physical Exam Triage Vital Signs ED Triage Vitals [11/18/21 1017]  Enc Vitals Group     BP 119/74     Pulse Rate (!) 59     Resp 17     Temp 98 F (36.7 C)     Temp src      SpO2 98 %     Weight      Height      Head Circumference      Peak Flow      Pain Score 0     Pain Loc      Pain Edu?      Excl. in GC?    No data  found.  Updated Vital Signs BP 119/74   Pulse (!) 59   Temp 98 F (36.7 C)   Resp 17   SpO2 98%   Breastfeeding No   Visual Acuity Right Eye Distance:   Left Eye Distance:   Bilateral Distance:    Right Eye Near:   Left Eye Near:    Bilateral Near:     Physical Exam Constitutional:      General: She is not in acute distress.    Appearance: Normal appearance. She is not toxic-appearing or diaphoretic.  HENT:     Head: Normocephalic and atraumatic.  Eyes:     Extraocular Movements: Extraocular movements intact.     Conjunctiva/sclera: Conjunctivae normal.  Cardiovascular:     Rate and Rhythm: Normal rate and regular rhythm.     Pulses: Normal pulses.     Heart sounds: Normal heart sounds.  Pulmonary:     Effort: Pulmonary effort is normal. No respiratory distress.     Breath sounds: Normal breath sounds.  Abdominal:     General: Bowel sounds are normal. There is no distension.     Palpations: Abdomen is soft.     Tenderness: There is no abdominal tenderness.  Genitourinary:    Comments: Deferred with shared decision-making.  Self swab performed. Neurological:     General: No focal deficit present.     Mental Status: She is alert and oriented to person, place, and time. Mental status is at baseline.  Psychiatric:        Mood and Affect: Mood normal.        Behavior: Behavior normal.        Thought Content: Thought content normal.        Judgment: Judgment normal.      UC Treatments / Results  Labs (all labs ordered are listed, but only abnormal results are displayed) Labs Reviewed  CERVICOVAGINAL ANCILLARY ONLY    EKG   Radiology No results found.  Procedures Procedures (including critical care time)  Medications Ordered in UC Medications - No data to display  Initial Impression / Assessment and Plan / UC Course  I have reviewed the triage vital signs and  the nursing notes.  Pertinent labs & imaging results that were available during my care of  the patient were reviewed by me and considered in my medical decision making (see chart for details).     Cervicovaginal swab pending.  Will await results for any further treatment at this time.  Patient declined HIV and RPR testing.  She is currently on her menstrual cycle with no urinary symptoms so will defer urine pregnancy and urinalysis as it is not necessary.  Patient to follow-up if symptoms persist or worsen.  Also advised patient to refrain from sexual activity until test results and treatment are complete.  Patient verbalized understanding and was agreeable with plan. Final Clinical Impressions(s) / UC Diagnoses   Final diagnoses:  Vaginal odor  Screening examination for venereal disease     Discharge Instructions      Your vaginal swab is pending.  We will call if it is abnormal and treat as appropriate.  Please refrain from sexual activity until test results and treatment are complete.    ED Prescriptions   None    PDMP not reviewed this encounter.   Gustavus Bryant, [place] 11/18/21 1051

## 2021-11-18 NOTE — ED Triage Notes (Addendum)
Pt is present today with c/o vaginal odor and would like to be tested for STD. Pt vaginal odor started one week ago.

## 2021-11-18 NOTE — Discharge Instructions (Signed)
Your vaginal swab is pending.  We will call if it is abnormal and treat as appropriate.  Please refrain from sexual activity until test results and treatment are complete.

## 2021-11-19 ENCOUNTER — Telehealth: Payer: Self-pay | Admitting: Physician Assistant

## 2021-11-19 LAB — CERVICOVAGINAL ANCILLARY ONLY
Bacterial Vaginitis (gardnerella): POSITIVE — AB
Candida Glabrata: NEGATIVE
Candida Vaginitis: NEGATIVE
Chlamydia: NEGATIVE
Comment: NEGATIVE
Comment: NEGATIVE
Comment: NEGATIVE
Comment: NEGATIVE
Comment: NEGATIVE
Comment: NORMAL
Neisseria Gonorrhea: NEGATIVE
Trichomonas: NEGATIVE

## 2021-11-19 MED ORDER — METRONIDAZOLE 500 MG PO TABS: 500.0000 mg | ORAL_TABLET | Freq: Two times a day (BID) | ORAL | 0 refills | Status: DC

## 2021-11-19 NOTE — Telephone Encounter (Signed)
Patient called regarding testing positive for BV- treatment sent to pharmacy.

## 2022-01-20 ENCOUNTER — Ambulatory Visit
Admission: EM | Admit: 2022-01-20 | Discharge: 2022-01-20 | Disposition: A | Discharge status: Home / Self Care | Payer: Medicaid Other | Attending: Physician Assistant | Admitting: Physician Assistant

## 2022-01-20 DIAGNOSIS — N898 Other specified noninflammatory disorders of vagina: Secondary | ICD-10-CM

## 2022-01-20 DIAGNOSIS — O98519 Other viral diseases complicating pregnancy, unspecified trimester: Secondary | ICD-10-CM | POA: Diagnosis present

## 2022-01-20 DIAGNOSIS — B009 Herpesviral infection, unspecified: Secondary | ICD-10-CM | POA: Diagnosis present

## 2022-01-20 MED ORDER — VALACYCLOVIR HCL 500 MG PO TABS: 500.0000 mg | ORAL_TABLET | Freq: Two times a day (BID) | ORAL | 0 refills | Status: AC

## 2022-01-20 MED ORDER — FLUCONAZOLE 150 MG PO TABS: ORAL_TABLET | ORAL | 0 refills | Status: DC

## 2022-01-20 MED ORDER — METRONIDAZOLE 500 MG PO TABS: 500.0000 mg | ORAL_TABLET | Freq: Two times a day (BID) | ORAL | 0 refills | Status: AC

## 2022-01-20 NOTE — ED Provider Notes (Signed)
[name]    CSN: [id number] Arrival date & time: 01/20/22  0839      History   Chief Complaint Chief Complaint  Patient presents with   SEXUALLY TRANSMITTED DISEASE    Check up - Entered by patient    HPI Anita Sims is a 26 y.o. female.   Patient here today for evaluation malodorous vaginal discharge. She denies any known STD exposure but would like screening for same. She does have known genital herpes and would like refill of valtrex if possible. She denies any other concerns.   The history is provided by the patient.    Past Medical History:  Diagnosis Date   Anemia    hx of, but not during pregnancy   Anxiety    "was on medication long time ago"; pt states   Bacterial vaginosis    Bipolar 1 disorder (HCC)    "was on medication a long time ago" pt states   Chlamydia    Genital herpes    Gonorrhea    Substance abuse [facility])     Patient Active Problem List   Diagnosis Date Noted   Pelvic pain in female 03/14/2020   Trichomonas contact, treated 03/14/2020   Screening examination for STD (sexually transmitted disease) 03/14/2020   Status post cesarean delivery 11/26/2019   Unwanted fertility 10/19/2019   Marijuana user 05/16/2019   Herpes simplex type 2 (HSV-2) infection affecting pregnancy, antepartum 05/16/2019   Miscarriage within last 12 months 03/14/2019   Polysubstance abuse (HCC) 09/01/2017    Past Surgical History:  Procedure Laterality Date   CESAREAN SECTION  11/26/2019   Procedure: CESAREAN SECTION;  Surgeon: Westphalia Bing, MD;  Location: [facility];  Service: Obstetrics;;   MOUTH SURGERY     TUBAL LIGATION      OB History     Gravida  4   Para  3   Term  3   Preterm  0   AB  1   Living  3      SAB  1   IAB  0   Ectopic  0   Multiple  0   Live Births  3            Home Medications    Prior to Admission medications   Medication Sig Start Date End Date Taking? Authorizing Provider   fluconazole (DIFLUCAN) 150 MG tablet Take one tab PO then repeat dose in 3 days if symptoms persist. 01/20/22  Yes Tomi Bamberger, PA-C  metroNIDAZOLE (FLAGYL) 500 MG tablet Take 1 tablet (500 mg total) by mouth 2 (two) times daily for 7 days. 01/20/22 01/27/22  Tomi Bamberger, PA-C  ondansetron (ZOFRAN) 4 MG tablet Take 1 tablet (4 mg total) by mouth every 8 (eight) hours as needed for nausea or vomiting. Patient not taking: Reported on 01/21/2021 07/11/20   Hurshel Party, CNM  terconazole (TERAZOL 7) 0.4 % vaginal cream Place 1 applicator vaginally at bedtime. Patient not taking: Reported on 01/21/2021 12/24/20   Hermina Staggers, MD  valACYclovir (VALTREX) 500 MG tablet Take 1 tablet (500 mg total) by mouth 2 (two) times daily. 01/20/22   Tomi Bamberger, PA-C  loratadine (CLARITIN) 10 MG tablet Take 1 tablet (10 mg total) by mouth daily. Patient not taking: No sig reported 08/10/19 08/17/20  Brock Bad, MD    Family History Family History  Problem Relation Age of Onset   Diabetes Maternal Grandfather  type II   Cancer Maternal Grandfather    Heart disease Maternal Grandmother    Heart disease Mother     Social History Social History   Tobacco Use   Smoking status: Former    Types: Cigars   Smokeless tobacco: Never   Tobacco comments:    Hookah  Vaping Use   Vaping Use: Never used  Substance Use Topics   Alcohol use: Not Currently    Comment: last use 02/12/2019   Drug use: Yes    Types: Marijuana    Comment: occasionally     Allergies   Patient has no known allergies.   Review of Systems Review of Systems  Constitutional:  Negative for chills and fever.  Eyes:  Negative for discharge and redness.  Respiratory:  Negative for shortness of breath.   Gastrointestinal:  Negative for abdominal pain, nausea and vomiting.  Genitourinary:  Positive for genital sores and vaginal discharge.     Physical Exam Triage Vital Signs ED Triage Vitals  [01/20/22 0936]  Enc Vitals Group     BP 117/65     Pulse Rate 62     Resp 16     Temp 98.6 F (37 C)     Temp Source Oral     SpO2 98 %     Weight      Height      Head Circumference      Peak Flow      Pain Score 0     Pain Loc      Pain Edu?      Excl. in GC?    No data found.  Updated Vital Signs BP 117/65 (BP Location: Left Arm)   Pulse 62   Temp 98.6 F (37 C) (Oral)   Resp 16   SpO2 98%      Physical Exam Vitals and nursing note reviewed.  Constitutional:      General: She is not in acute distress.    Appearance: Normal appearance. She is not ill-appearing.  HENT:     Head: Normocephalic and atraumatic.  Eyes:     Conjunctiva/sclera: Conjunctivae normal.  Cardiovascular:     Rate and Rhythm: Normal rate.  Pulmonary:     Effort: Pulmonary effort is normal.  Neurological:     Mental Status: She is alert.  Psychiatric:        Mood and Affect: Mood normal.        Behavior: Behavior normal.        Thought Content: Thought content normal.      UC Treatments / Results  Labs (all labs ordered are listed, but only abnormal results are displayed) Labs Reviewed  CERVICOVAGINAL ANCILLARY ONLY    EKG   Radiology No results found.  Procedures Procedures (including critical care time)  Medications Ordered in UC Medications - No data to display  Initial Impression / Assessment and Plan / UC Course  I have reviewed the triage vital signs and the nursing notes.  Pertinent labs & imaging results that were available during my care of the patient were reviewed by me and considered in my medical decision making (see chart for details).    STD screening ordered. Will treat to cover BV and patient requested diflucan as well as she typically will get yeast infections after BV treatment. Valtrex refilled as well. Encouraged follow up with any further concerns other wise will await testing results for further recommendation. Patient kindly declines blood work  today.  Final Clinical Impressions(s) / UC Diagnoses   Final diagnoses:  Vaginal discharge   Discharge Instructions   None    ED Prescriptions     Medication Sig Dispense Auth. Provider   metroNIDAZOLE (FLAGYL) 500 MG tablet Take 1 tablet (500 mg total) by mouth 2 (two) times daily for 7 days. 14 tablet Ewell Poe F, PA-C   valACYclovir (VALTREX) 500 MG tablet Take 1 tablet (500 mg total) by mouth 2 (two) times daily. 30 tablet Ewell Poe F, PA-C   fluconazole (DIFLUCAN) 150 MG tablet Take one tab PO then repeat dose in 3 days if symptoms persist. 2 tablet Francene Finders, PA-C      PDMP not reviewed this encounter.   Francene Finders, PA-C 01/20/22 1016

## 2022-01-20 NOTE — ED Triage Notes (Signed)
Pt c/o vaginal malodor, onset ~ 2 weeks ago   Requesting sti screening

## 2022-01-21 LAB — CERVICOVAGINAL ANCILLARY ONLY
Bacterial Vaginitis (gardnerella): POSITIVE — AB
Candida Glabrata: NEGATIVE
Candida Vaginitis: NEGATIVE
Chlamydia: NEGATIVE
Comment: NEGATIVE
Comment: NEGATIVE
Comment: NEGATIVE
Comment: NEGATIVE
Comment: NEGATIVE
Comment: NORMAL
Neisseria Gonorrhea: NEGATIVE
Trichomonas: NEGATIVE

## 2022-03-04 ENCOUNTER — Ambulatory Visit (INDEPENDENT_AMBULATORY_CARE_PROVIDER_SITE_OTHER): Payer: Medicaid Other

## 2022-03-04 ENCOUNTER — Other Ambulatory Visit (HOSPITAL_COMMUNITY)
Admission: RE | Admit: 2022-03-04 | Discharge: 2022-03-04 | Disposition: A | Discharge status: Home / Self Care | Payer: Medicaid Other | Source: Ambulatory Visit | Attending: Obstetrics and Gynecology | Admitting: Obstetrics and Gynecology

## 2022-03-04 ENCOUNTER — Ambulatory Visit: Payer: Medicaid Other

## 2022-03-04 DIAGNOSIS — N898 Other specified noninflammatory disorders of vagina: Secondary | ICD-10-CM | POA: Diagnosis present

## 2022-03-04 DIAGNOSIS — B009 Herpesviral infection, unspecified: Secondary | ICD-10-CM

## 2022-03-04 MED ORDER — VALACYCLOVIR HCL 1 G PO TABS: 1000.0000 mg | ORAL_TABLET | Freq: Two times a day (BID) | ORAL | 3 refills | Status: AC

## 2022-03-04 NOTE — Progress Notes (Signed)
SUBJECTIVE:  26 y.o. female complains of milky vaginal discharge for 2 week(s). Denies abnormal vaginal bleeding or significant pelvic pain or fever. No UTI symptoms. Denies history of known exposure to STD.  Patient states that she has a new partner and wants to make sure that she does not have any STD's  No LMP recorded.  OBJECTIVE:  She appears well, afebrile. Urine dipstick: not done.  ASSESSMENT:  Vaginal Discharge  Vaginal Odor   PLAN:  GC, chlamydia, trichomonas, BVAG, CVAG probe sent to lab. Treatment: To be determined once lab results are received ROV prn if symptoms persist or worsen.   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 Okay to start on 1g valtrex daily per Dr. Donavan Foil

## 2022-03-05 LAB — CERVICOVAGINAL ANCILLARY ONLY
Bacterial Vaginitis (gardnerella): POSITIVE — AB
Candida Glabrata: NEGATIVE
Candida Vaginitis: NEGATIVE
Chlamydia: NEGATIVE
Comment: NEGATIVE
Comment: NEGATIVE
Comment: NEGATIVE
Comment: NEGATIVE
Comment: NEGATIVE
Comment: NORMAL
Neisseria Gonorrhea: NEGATIVE
Trichomonas: NEGATIVE

## 2022-03-10 ENCOUNTER — Other Ambulatory Visit: Payer: Self-pay | Admitting: Emergency Medicine

## 2022-03-10 DIAGNOSIS — N760 Acute vaginitis: Secondary | ICD-10-CM

## 2022-03-10 MED ORDER — METRONIDAZOLE 500 MG PO TABS: 500.0000 mg | ORAL_TABLET | Freq: Two times a day (BID) | ORAL | 0 refills | Status: DC

## 2022-03-10 MED ORDER — FLUCONAZOLE 150 MG PO TABS: 150.0000 mg | ORAL_TABLET | Freq: Once | ORAL | 0 refills | Status: AC

## 2022-03-10 NOTE — Progress Notes (Signed)
Rx for BV per protocol. Diflucan for yeast post abx treatment.

## 2022-05-14 ENCOUNTER — Other Ambulatory Visit (HOSPITAL_COMMUNITY)
Admission: RE | Admit: 2022-05-14 | Discharge: 2022-05-14 | Disposition: A | Discharge status: Home / Self Care | Payer: Medicaid Other | Source: Ambulatory Visit | Attending: Obstetrics and Gynecology | Admitting: Obstetrics and Gynecology

## 2022-05-14 ENCOUNTER — Ambulatory Visit (INDEPENDENT_AMBULATORY_CARE_PROVIDER_SITE_OTHER): Payer: Medicaid Other | Admitting: Emergency Medicine

## 2022-05-14 VITALS — BP 131/81 | HR 81 | Wt 159.0 lb

## 2022-05-14 DIAGNOSIS — N898 Other specified noninflammatory disorders of vagina: Secondary | ICD-10-CM | POA: Diagnosis present

## 2022-05-14 DIAGNOSIS — B009 Herpesviral infection, unspecified: Secondary | ICD-10-CM

## 2022-05-14 MED ORDER — VALACYCLOVIR HCL 1 G PO TABS: 1000.0000 mg | ORAL_TABLET | Freq: Every day | ORAL | 11 refills | Status: DC

## 2022-05-14 MED ORDER — METRONIDAZOLE 500 MG PO TABS: 500.0000 mg | ORAL_TABLET | Freq: Two times a day (BID) | ORAL | 0 refills | Status: DC

## 2022-05-14 NOTE — Progress Notes (Signed)
SUBJECTIVE:  27 y.o. female complains of clear vaginal discharge for 3 day(s). Denies abnormal vaginal bleeding or significant pelvic pain or fever. No UTI symptoms. Denies history of known exposure to STD.  No LMP recorded.  OBJECTIVE:  She appears well, afebrile.   ASSESSMENT:  Vaginal Discharge  Vaginal Odor   PLAN:  GC, chlamydia, trichomonas, BVAG, CVAG probe sent to lab. Treatment: To be determined once lab results are received ROV prn if symptoms persist or worsen.   Reports frequent herpes outbreaks, requests to switch for episodic to suppression treatment. Per Jodi Mourning, MD, approved to switch treatment to 1000mg  daily Valtrex.

## 2022-05-15 LAB — CERVICOVAGINAL ANCILLARY ONLY
Candida Glabrata: NEGATIVE
Candida Vaginitis: NEGATIVE
Chlamydia: NEGATIVE
Comment: NEGATIVE
Comment: NEGATIVE
Comment: NEGATIVE
Comment: NEGATIVE
Comment: NORMAL
Neisseria Gonorrhea: NEGATIVE
Trichomonas: NEGATIVE

## 2022-08-04 ENCOUNTER — Ambulatory Visit (INDEPENDENT_AMBULATORY_CARE_PROVIDER_SITE_OTHER): Payer: Medicaid Other | Admitting: *Deleted

## 2022-08-04 ENCOUNTER — Other Ambulatory Visit (HOSPITAL_COMMUNITY)
Admission: RE | Admit: 2022-08-04 | Discharge: 2022-08-04 | Disposition: A | Discharge status: Home / Self Care | Payer: Medicaid Other | Source: Ambulatory Visit | Attending: Obstetrics and Gynecology | Admitting: Obstetrics and Gynecology

## 2022-08-04 VITALS — BP 120/83 | HR 59

## 2022-08-04 DIAGNOSIS — N898 Other specified noninflammatory disorders of vagina: Secondary | ICD-10-CM | POA: Diagnosis present

## 2022-08-04 NOTE — Progress Notes (Signed)
SUBJECTIVE:  27 y.o. female complains of clear vaginal discharge for 3 day(s) and vaginal odor. Denies abnormal vaginal bleeding or significant pelvic pain or fever. No UTI symptoms. Denies history of known exposure to STD.  No LMP recorded.  OBJECTIVE:  She appears well, afebrile. Urine dipstick: not done.  ASSESSMENT:  Vaginal Discharge  Vaginal Odor   PLAN:  BVAG, CVAG only probe sent to lab per pt request. Treatment: To be determined once lab results are received ROV prn if symptoms persist or worsen.

## 2022-08-05 LAB — CERVICOVAGINAL ANCILLARY ONLY
Bacterial Vaginitis (gardnerella): POSITIVE — AB
Candida Glabrata: NEGATIVE
Candida Vaginitis: NEGATIVE
Comment: NEGATIVE
Comment: NEGATIVE
Comment: NEGATIVE

## 2022-08-05 MED ORDER — METRONIDAZOLE 500 MG PO TABS: 500.0000 mg | ORAL_TABLET | Freq: Two times a day (BID) | ORAL | 0 refills | Status: AC

## 2022-08-05 NOTE — Addendum Note (Signed)
Addended by: Catalina Antigua on: 08/05/2022 04:01 PM   Modules accepted: Orders

## 2023-01-07 ENCOUNTER — Ambulatory Visit: Payer: BC Managed Care – PPO

## 2023-05-20 ENCOUNTER — Ambulatory Visit
Admission: EM | Admit: 2023-05-20 | Discharge: 2023-05-20 | Disposition: A | Discharge status: Home / Self Care | Payer: Medicaid Other | Attending: Physician Assistant | Admitting: Physician Assistant

## 2023-05-20 DIAGNOSIS — N898 Other specified noninflammatory disorders of vagina: Secondary | ICD-10-CM | POA: Diagnosis not present

## 2023-05-20 DIAGNOSIS — A6000 Herpesviral infection of urogenital system, unspecified: Secondary | ICD-10-CM | POA: Insufficient documentation

## 2023-05-20 DIAGNOSIS — Z113 Encounter for screening for infections with a predominantly sexual mode of transmission: Secondary | ICD-10-CM | POA: Insufficient documentation

## 2023-05-20 NOTE — ED Triage Notes (Signed)
"  I need to get a self swab for STI testing, I feel like I have BV or possible STI". "Having some vaginal discharge". No dysuria. No abd pain. No fever. No nausea or vomiting.

## 2023-05-20 NOTE — ED Provider Notes (Signed)
 [name]    CSN: [id number] Arrival date & time: 05/20/23  1634      History   Chief Complaint Chief Complaint  Patient presents with   SEXUALLY TRANSMITTED DISEASE    Testing    HPI Anita Sims is a 28 y.o. female.   Patient here today for vaginal discharge.  She reports that she has an odor to same which is similar to prior BV.  She denies any known STD exposures but would like STD screening.  She denies any abdominal pain or back pain.  The history is provided by the patient.    Past Medical History:  Diagnosis Date   Anemia    hx of, but not during pregnancy   Anxiety    was on medication long time ago; pt states   Bacterial vaginosis    Bipolar 1 disorder (HCC)    was on medication a long time ago pt states   Chlamydia    Genital herpes    Gonorrhea    Substance abuse (HCC)     Patient Active Problem List   Diagnosis Date Noted   Genital herpes simplex 05/20/2023   Pelvic pain in female 03/14/2020   Trichomonas contact, treated 03/14/2020   Screening examination for STD (sexually transmitted disease) 03/14/2020   Status post cesarean delivery 11/26/2019   Unwanted fertility 10/19/2019   Marijuana user 05/16/2019   Herpes simplex type 2 (HSV-2) infection affecting pregnancy, antepartum 05/16/2019   Miscarriage within last 12 months 03/14/2019   History of postpartum depression 12/13/2018   Polysubstance abuse (HCC) 09/01/2017   Pregnancy 03/09/2017    Past Surgical History:  Procedure Laterality Date   CESAREAN SECTION  11/26/2019   Procedure: CESAREAN SECTION;  Surgeon: Izell Harari, MD;  Location: [facility] LD [facility]S;  Service: Obstetrics;;   MOUTH SURGERY     TUBAL LIGATION      OB History     Gravida  4   Para  3   Term  3   Preterm  0   AB  1   Living  3      SAB  1   IAB  0   Ectopic  0   Multiple  0   Live Births  3            Home Medications    Prior to Admission medications    Medication Sig Start Date End Date Taking? Authorizing Provider  fluconazole  (DIFLUCAN ) 150 MG tablet Take one tab PO then repeat dose in 3 days if symptoms persist. 01/20/22   Billy Asberry FALCON, PA-C  metroNIDAZOLE  (FLAGYL ) 500 MG tablet Take 1 tablet (500 mg total) by mouth 2 (two) times daily. 03/10/22   Constant, Peggy, MD  metroNIDAZOLE  (FLAGYL ) 500 MG tablet Take 1 tablet (500 mg total) by mouth 2 (two) times daily. 05/14/22   Ervin, Michael L, MD  metroNIDAZOLE  (FLAGYL ) 500 MG tablet Take 1 tablet (500 mg total) by mouth 2 (two) times daily. 08/05/22   Constant, Peggy, MD  nitrofurantoin , macrocrystal-monohydrate, (MACROBID ) 100 MG capsule Take 1 capsule by mouth every 12 (twelve) hours.    [provider]  ondansetron  (ZOFRAN ) 4 MG tablet Take 1 tablet (4 mg total) by mouth every 8 (eight) hours as needed for nausea or vomiting. Patient not taking: Reported on 01/21/2021 07/11/20   Leftwich-Kirby, Olam LABOR, CNM  Prenatal Vit-DSS-Fe Cbn-FA (PRENATAL AD PO)     [provider]  terconazole  (TERAZOL 7 ) 0.4 % vaginal  cream Place 1 applicator vaginally at bedtime. 12/24/20   Ervin, Michael L, MD  valACYclovir  (VALTREX ) 1000 MG tablet Take 1 tablet (1,000 mg total) by mouth 2 (two) times daily. Take for ten days. 03/04/22   Zina Jerilynn LABOR, MD  valACYclovir  (VALTREX ) 1000 MG tablet Take 1 tablet (1,000 mg total) by mouth daily. 05/14/22   Rudy Carlin LABOR, MD  valACYclovir  (VALTREX ) 500 MG tablet Take 1 tablet (500 mg total) by mouth 2 (two) times daily. 01/20/22   Billy Asberry FALCON, PA-C  loratadine  (CLARITIN ) 10 MG tablet Take 1 tablet (10 mg total) by mouth daily. Patient not taking: No sig reported 08/10/19 08/17/20  Rudy Carlin LABOR, MD    Family History Family History  Problem Relation Age of Onset   Diabetes Maternal Grandfather        type II   Cancer Maternal Grandfather    Heart disease Maternal Grandmother    Heart disease Mother     Social History Social History    Tobacco Use   Smoking status: Former    Types: Cigars   Smokeless tobacco: Never   Tobacco comments:    Hookah  Vaping Use   Vaping status: Never Used  Substance Use Topics   Alcohol use: Not Currently    Comment: last use 02/12/2019   Drug use: Yes    Types: Marijuana    Comment: occasionally     Allergies   Patient has no known allergies.   Review of Systems Review of Systems  Constitutional:  Negative for chills and fever.  Eyes:  Negative for discharge and redness.  Respiratory:  Negative for shortness of breath.   Gastrointestinal:  Negative for abdominal pain, nausea and vomiting.  Genitourinary:  Positive for vaginal discharge. Negative for genital sores.  Musculoskeletal:  Negative for back pain.     Physical Exam Triage Vital Signs ED Triage Vitals  Encounter Vitals Group     BP 05/20/23 1704 94/66     Systolic BP Percentile --      Diastolic BP Percentile --      Pulse Rate 05/20/23 1704 73     Resp 05/20/23 1704 18     Temp 05/20/23 1704 98.5 F (36.9 C)     Temp Source 05/20/23 1704 Oral     SpO2 05/20/23 1704 97 %     Weight 05/20/23 1702 175 lb (79.4 kg)     Height 05/20/23 1702 5' 3 (1.6 m)     Head Circumference --      Peak Flow --      Pain Score 05/20/23 1701 0     Pain Loc --      Pain Education --      Exclude from Growth Chart --    No data found.  Updated Vital Signs BP 94/66 (BP Location: Left Arm)   Pulse 73   Temp 98.5 F (36.9 C) (Oral)   Resp 18   Ht 5' 3 (1.6 m)   Wt 175 lb (79.4 kg)   LMP 05/04/2023 (Exact Date)   SpO2 97%   BMI 31.00 kg/m   Visual Acuity Right Eye Distance:   Left Eye Distance:   Bilateral Distance:    Right Eye Near:   Left Eye Near:    Bilateral Near:     Physical Exam Vitals and nursing note reviewed.  Constitutional:      General: She is not in acute distress.    Appearance: Normal appearance. She is not  ill-appearing.  HENT:     Head: Normocephalic and atraumatic.  Eyes:      Conjunctiva/sclera: Conjunctivae normal.  Cardiovascular:     Rate and Rhythm: Normal rate.  Pulmonary:     Effort: Pulmonary effort is normal. No respiratory distress.  Neurological:     Mental Status: She is alert.  Psychiatric:        Mood and Affect: Mood normal.        Behavior: Behavior normal.        Thought Content: Thought content normal.      UC Treatments / Results  Labs (all labs ordered are listed, but only abnormal results are displayed) Labs Reviewed  CERVICOVAGINAL ANCILLARY ONLY    EKG   Radiology No results found.  Procedures Procedures (including critical care time)  Medications Ordered in UC Medications - No data to display  Initial Impression / Assessment and Plan / UC Course  I have reviewed the triage vital signs and the nursing notes.  Pertinent labs & imaging results that were available during my care of the patient were reviewed by me and considered in my medical decision making (see chart for details).    Screening ordered for gonorrhea, chlamydia, trichomonas as well as BV and yeast.  Will await results further recommendation.  Final Clinical Impressions(s) / UC Diagnoses   Final diagnoses:  Vaginal discharge   Discharge Instructions   None    ED Prescriptions   None    PDMP not reviewed this encounter.   Billy Asberry FALCON, PA-C 05/20/23 1722

## 2023-05-21 ENCOUNTER — Telehealth (HOSPITAL_COMMUNITY): Payer: Self-pay

## 2023-05-21 LAB — CERVICOVAGINAL ANCILLARY ONLY
Bacterial Vaginitis (gardnerella): POSITIVE — AB
Candida Glabrata: NEGATIVE
Candida Vaginitis: NEGATIVE
Chlamydia: POSITIVE — AB
Comment: NEGATIVE
Comment: NEGATIVE
Comment: NEGATIVE
Comment: NEGATIVE
Comment: NEGATIVE
Comment: NORMAL
Neisseria Gonorrhea: NEGATIVE
Trichomonas: NEGATIVE

## 2023-05-21 MED ORDER — DOXYCYCLINE HYCLATE 100 MG PO TABS: 100.0000 mg | ORAL_TABLET | Freq: Two times a day (BID) | ORAL | 0 refills | Status: AC

## 2023-05-21 MED ORDER — METRONIDAZOLE 500 MG PO TABS: 500.0000 mg | ORAL_TABLET | Freq: Two times a day (BID) | ORAL | 0 refills | Status: AC

## 2023-05-21 NOTE — Telephone Encounter (Signed)
 Per protocol, pt requires tx with metronidazole and Doxycycline. Reviewed with patient, verified pharmacy, prescription sent.

## 2023-06-04 ENCOUNTER — Ambulatory Visit: Payer: BC Managed Care – PPO

## 2023-06-16 ENCOUNTER — Other Ambulatory Visit (HOSPITAL_COMMUNITY)
Admission: RE | Admit: 2023-06-16 | Discharge: 2023-06-16 | Disposition: A | Discharge status: Home / Self Care | Source: Ambulatory Visit | Attending: Obstetrics | Admitting: Obstetrics

## 2023-06-16 ENCOUNTER — Ambulatory Visit: Payer: Medicaid Other | Admitting: Obstetrics

## 2023-06-16 ENCOUNTER — Encounter: Payer: Self-pay | Admitting: Obstetrics

## 2023-06-16 VITALS — BP 98/61 | HR 72 | Ht 63.0 in | Wt 175.7 lb

## 2023-06-16 DIAGNOSIS — Z01419 Encounter for gynecological examination (general) (routine) without abnormal findings: Secondary | ICD-10-CM | POA: Insufficient documentation

## 2023-06-16 DIAGNOSIS — Z202 Contact with and (suspected) exposure to infections with a predominantly sexual mode of transmission: Secondary | ICD-10-CM | POA: Diagnosis not present

## 2023-06-16 DIAGNOSIS — Z8619 Personal history of other infectious and parasitic diseases: Secondary | ICD-10-CM

## 2023-06-16 DIAGNOSIS — N898 Other specified noninflammatory disorders of vagina: Secondary | ICD-10-CM | POA: Diagnosis present

## 2023-06-16 DIAGNOSIS — Z3009 Encounter for other general counseling and advice on contraception: Secondary | ICD-10-CM

## 2023-06-16 MED ORDER — AZITHROMYCIN 250 MG PO TABS: ORAL_TABLET | ORAL | 2 refills | Status: AC

## 2023-06-16 MED ORDER — METRONIDAZOLE 500 MG PO TABS: 500.0000 mg | ORAL_TABLET | Freq: Two times a day (BID) | ORAL | 2 refills | Status: AC

## 2023-06-16 MED ORDER — VALACYCLOVIR HCL 1 G PO TABS: 1000.0000 mg | ORAL_TABLET | Freq: Every day | ORAL | 11 refills | Status: AC

## 2023-06-16 MED ORDER — FLUCONAZOLE 150 MG PO TABS: ORAL_TABLET | ORAL | 0 refills | Status: DC

## 2023-06-16 NOTE — Progress Notes (Signed)
 Subjective:        Anita Sims is a 28 y.o. female here for a routine exam.  Current complaints: Vaginal discharge.  Recent Chlamydia infection, treated, but partner not treated..    Personal health questionnaire:  Is patient Ashkenazi Jewish, have a family history of breast and/or ovarian cancer: no Is there a family history of uterine cancer diagnosed at age < 72, gastrointestinal cancer, urinary tract cancer, family member who is a Personnel officer syndrome-associated carrier: no Is the patient overweight and hypertensive, family history of diabetes, personal history of gestational diabetes, preeclampsia or PCOS: no Is patient over 21, have PCOS,  family history of premature CHD under age 46, diabetes, smoke, have hypertension or peripheral artery disease:  no At any time, has a partner hit, kicked or otherwise hurt or frightened you?: no Over the past 2 weeks, have you felt down, depressed or hopeless?: no Over the past 2 weeks, have you felt little interest or pleasure in doing things?:no   Gynecologic History Patient's last menstrual period was 05/24/2023 (exact date). Contraception: tubal ligation Last Pap: 2021. Results were: normal Last mammogram: n/a. Results were: n/a  Obstetric History OB History  Gravida Para Term Preterm AB Living  4 3 3  0 1 3  SAB IAB Ectopic Multiple Live Births  1 0 0 0 3    # Outcome Date GA Lbr Len/2nd Weight Sex Type Anes PTL Lv  4 Term 11/26/19 [redacted]w[redacted]d  7 lb 9.4 oz (3.441 kg) M CS-LTranv Spinal  LIV  3 SAB 02/2019          2 Term 11/16/18 [redacted]w[redacted]d 04:44 / 00:02 8 lb 4.8 oz (3.765 kg) M Vag-Spont None  LIV  1 Term 09/01/17 [redacted]w[redacted]d 02:21 / 00:12 7 lb 0.2 oz (3.181 kg) F Vag-Spont None  LIV    Past Medical History:  Diagnosis Date   Anemia    hx of, but not during pregnancy   Anxiety    "was on medication long time ago"; pt states   Bacterial vaginosis    Bipolar 1 disorder (HCC)    "was on medication a long time ago" pt states   Chlamydia     Genital herpes    Gonorrhea    Substance abuse (HCC)     Past Surgical History:  Procedure Laterality Date   CESAREAN SECTION  11/26/2019   Procedure: CESAREAN SECTION;  Surgeon: [place] Bing, MD;  Location: [facility];  Service: Obstetrics;;   MOUTH SURGERY     TUBAL LIGATION       Current Outpatient Medications:    azithromycin (ZITHROMAX Z-PAK) 250 MG tablet, Take 4 tablets ( 1000 mg ) po at once., Disp: 4 tablet, Rfl: 2   metroNIDAZOLE (FLAGYL) 500 MG tablet, Take 1 tablet (500 mg total) by mouth 2 (two) times daily., Disp: 14 tablet, Rfl: 2   fluconazole (DIFLUCAN) 150 MG tablet, Take one tab PO then repeat dose in 3 days if symptoms persist., Disp: 2 tablet, Rfl: 0   metroNIDAZOLE (FLAGYL) 500 MG tablet, Take 1 tablet (500 mg total) by mouth 2 (two) times daily. (Patient not taking: Reported on 06/16/2023), Disp: 14 tablet, Rfl: 0   metroNIDAZOLE (FLAGYL) 500 MG tablet, Take 1 tablet (500 mg total) by mouth 2 (two) times daily. (Patient not taking: Reported on 06/16/2023), Disp: 14 tablet, Rfl: 0   metroNIDAZOLE (FLAGYL) 500 MG tablet, Take 1 tablet (500 mg total) by mouth 2 (two) times daily. (Patient not taking: Reported on  06/16/2023), Disp: 14 tablet, Rfl: 0   metroNIDAZOLE (FLAGYL) 500 MG tablet, Take 1 tablet (500 mg total) by mouth 2 (two) times daily. (Patient not taking: Reported on 06/16/2023), Disp: 14 tablet, Rfl: 0   nitrofurantoin, macrocrystal-monohydrate, (MACROBID) 100 MG capsule, Take 1 capsule by mouth every 12 (twelve) hours. (Patient not taking: Reported on 06/16/2023), Disp: , Rfl:    ondansetron (ZOFRAN) 4 MG tablet, Take 1 tablet (4 mg total) by mouth every 8 (eight) hours as needed for nausea or vomiting. (Patient not taking: Reported on 06/16/2023), Disp: 20 tablet, Rfl: 0   Prenatal Vit-DSS-Fe Cbn-FA (PRENATAL AD PO), , Disp: , Rfl:    terconazole (TERAZOL 7) 0.4 % vaginal cream, Place 1 applicator vaginally at bedtime. (Patient not taking: Reported on 06/16/2023),  Disp: 45 g, Rfl: 0   valACYclovir (VALTREX) 1000 MG tablet, Take 1 tablet (1,000 mg total) by mouth 2 (two) times daily. Take for ten days. (Patient not taking: Reported on 06/16/2023), Disp: 20 tablet, Rfl: 3   valACYclovir (VALTREX) 1000 MG tablet, Take 1 tablet (1,000 mg total) by mouth daily., Disp: 30 tablet, Rfl: 11   valACYclovir (VALTREX) 500 MG tablet, Take 1 tablet (500 mg total) by mouth 2 (two) times daily. (Patient not taking: Reported on 06/16/2023), Disp: 30 tablet, Rfl: 0 No Known Allergies  Social History   Tobacco Use   Smoking status: Former    Types: Cigars   Smokeless tobacco: Never   Tobacco comments:    Hookah  Substance Use Topics   Alcohol use: Not Currently    Comment: last use 02/12/2019    Family History  Problem Relation Age of Onset   Diabetes Maternal Grandfather        type II   Cancer Maternal Grandfather    Heart disease Maternal Grandmother    Heart disease Mother       Review of Systems  Constitutional: negative for fatigue and weight loss Respiratory: negative for cough and wheezing Cardiovascular: negative for chest pain, fatigue and palpitations Gastrointestinal: negative for abdominal pain and change in bowel habits Musculoskeletal:negative for myalgias Neurological: negative for gait problems and tremors Behavioral/Psych: negative for abusive relationship, depression Endocrine: negative for temperature intolerance    Genitourinary: positive for vaginal discharge.  negative for abnormal menstrual periods, genital lesions, hot flashes, sexual problems  Integument/breast: negative for breast lump, breast tenderness, nipple discharge and skin lesion(s)    Objective:       BP 98/61   Pulse 72   Ht 5\' 3"  (1.6 m)   Wt 175 lb 11.2 oz (79.7 kg)   LMP 05/24/2023 (Exact Date)   BMI 31.12 kg/m  General:   Alert and no distress  Skin:   no rash or abnormalities  Lungs:   clear to auscultation bilaterally  Heart:   regular rate and rhythm,  S1, S2 normal, no murmur, click, rub or gallop  Breasts:   normal without suspicious masses, skin or nipple changes or axillary nodes  Abdomen:  normal findings: no organomegaly, soft, non-tender and no hernia  Pelvis:  External genitalia: normal general appearance Urinary system: urethral meatus normal and bladder without fullness, nontender Vaginal: normal without tenderness, induration or masses Cervix: normal appearance Adnexa: normal bimanual exam Uterus: anteverted and non-tender, normal size   Lab Review Urine pregnancy test Labs reviewed yes Radiologic studies reviewed no  I have spent a total of 20 minutes of face-to-face time, excluding clinical staff time, reviewing notes and preparing to see patient, ordering  tests and/or medications, and counseling the patient.   Assessment:    1. Encounter for gynecological examination with Papanicolaou smear of cervix (Primary) Rx: - Cytology - PAP( [facility])  2. Vaginal discharge Rx: - Cervicovaginal ancillary only( [facility]) - metroNIDAZOLE (FLAGYL) 500 MG tablet; Take 1 tablet (500 mg total) by mouth 2 (two) times daily.  Dispense: 14 tablet; Refill: 2 - fluconazole (DIFLUCAN) 150 MG tablet; Take one tab PO then repeat dose in 3 days if symptoms persist.  Dispense: 2 tablet; Refill: 0  3. Encounter for other general counseling and advice on contraception - has a tubal ligation - does not desire future fertility   4. H/O herpes genitalis Rx: - valACYclovir (VALTREX) 1000 MG tablet; Take 1 tablet (1,000 mg total) by mouth daily.  Dispense: 30 tablet; Refill: 11  5. Chlamydia contact, untreated Rx: - azithromycin (ZITHROMAX Z-PAK) 250 MG tablet; Take 4 tablets ( 1000 mg ) po at once.  Dispense: 4 tablet; Refill: 2    Plan:    Education reviewed: calcium supplements, depression evaluation, low fat, low cholesterol diet, safe sex/STD prevention, self breast exams, and weight bearing exercise. Follow up in: 3 months.    Chlamydia TOC.  Meds ordered this encounter  Medications   metroNIDAZOLE (FLAGYL) 500 MG tablet    Sig: Take 1 tablet (500 mg total) by mouth 2 (two) times daily.    Dispense:  14 tablet    Refill:  2   valACYclovir (VALTREX) 1000 MG tablet    Sig: Take 1 tablet (1,000 mg total) by mouth daily.    Dispense:  30 tablet    Refill:  11   azithromycin (ZITHROMAX Z-PAK) 250 MG tablet    Sig: Take 4 tablets ( 1000 mg ) po at once.    Dispense:  4 tablet    Refill:  2   fluconazole (DIFLUCAN) 150 MG tablet    Sig: Take one tab PO then repeat dose in 3 days if symptoms persist.    Dispense:  2 tablet    Refill:  0     Brock Bad, MD, FACOG Attending Obstetrician & Gynecologist, [facility] for [facility], [facility], [place] 06/16/2023

## 2023-06-18 LAB — CERVICOVAGINAL ANCILLARY ONLY
Bacterial Vaginitis (gardnerella): POSITIVE — AB
Candida Glabrata: NEGATIVE
Candida Vaginitis: NEGATIVE
Chlamydia: NEGATIVE
Comment: NEGATIVE
Comment: NEGATIVE
Comment: NEGATIVE
Comment: NEGATIVE
Comment: NEGATIVE
Comment: NORMAL
Neisseria Gonorrhea: NEGATIVE
Trichomonas: NEGATIVE

## 2023-06-19 ENCOUNTER — Other Ambulatory Visit: Payer: Self-pay | Admitting: Family Medicine

## 2023-06-25 LAB — CYTOLOGY - PAP
Comment: NEGATIVE
Comment: NEGATIVE
Comment: NEGATIVE
Diagnosis: HIGH — AB
HPV 16: NEGATIVE
HPV 18 / 45: POSITIVE — AB
High risk HPV: POSITIVE — AB

## 2023-06-28 ENCOUNTER — Encounter: Payer: Self-pay | Admitting: Family Medicine

## 2023-07-08 ENCOUNTER — Other Ambulatory Visit (HOSPITAL_COMMUNITY)
Admission: RE | Admit: 2023-07-08 | Discharge: 2023-07-08 | Disposition: A | Discharge status: Home / Self Care | Source: Ambulatory Visit | Attending: Obstetrics & Gynecology | Admitting: Obstetrics & Gynecology

## 2023-07-08 ENCOUNTER — Encounter: Payer: Self-pay | Admitting: Obstetrics & Gynecology

## 2023-07-08 ENCOUNTER — Ambulatory Visit (INDEPENDENT_AMBULATORY_CARE_PROVIDER_SITE_OTHER): Admitting: Obstetrics & Gynecology

## 2023-07-08 VITALS — BP 123/86 | HR 73 | Ht 63.0 in | Wt 176.0 lb

## 2023-07-08 DIAGNOSIS — Z3202 Encounter for pregnancy test, result negative: Secondary | ICD-10-CM | POA: Diagnosis not present

## 2023-07-08 DIAGNOSIS — R87611 Atypical squamous cells cannot exclude high grade squamous intraepithelial lesion on cytologic smear of cervix (ASC-H): Secondary | ICD-10-CM | POA: Insufficient documentation

## 2023-07-08 LAB — POCT URINE PREGNANCY: Preg Test, Ur: NEGATIVE

## 2023-07-08 NOTE — Progress Notes (Signed)
 Patient ID: Anita Sims, female   DOB: 05-Aug-1995, 28 y.o.   MRN: [id number]  Chief Complaint  Patient presents with   Colposcopy    HPI Anita Sims is a 28 y.o. female.  [id number] Patient's last menstrual period was 06/23/2023 (exact date).  HPI  Indications: Pap smear on March 2025 showed: ASC cannot exclude high grade lesion [facility]). Previous colposcopy: no. Prior cervical treatment: no treatment.  Past Medical History:  Diagnosis Date   Anemia    hx of, but not during pregnancy   Anxiety    "was on medication long time ago"; pt states   Bacterial vaginosis    Bipolar 1 disorder (HCC)    "was on medication a long time ago" pt states   Chlamydia    Genital herpes    Gonorrhea    Substance abuse (HCC)     Past Surgical History:  Procedure Laterality Date   CESAREAN SECTION  11/26/2019   Procedure: CESAREAN SECTION;  Surgeon: [place] Bing, MD;  Location: [facility];  Service: Obstetrics;;   MOUTH SURGERY     TUBAL LIGATION      Family History  Problem Relation Age of Onset   Diabetes Maternal Grandfather        type II   Cancer Maternal Grandfather    Heart disease Maternal Grandmother    Heart disease Mother     Social History Social History   Tobacco Use   Smoking status: Some Days    Types: Cigars   Smokeless tobacco: Current   Tobacco comments:    Hookah  Vaping Use   Vaping status: Never Used  Substance Use Topics   Alcohol use: Not Currently    Comment: last use 02/12/2019   Drug use: Yes    Types: Marijuana    Comment: occasionally    No Known Allergies  Current Outpatient Medications  Medication Sig Dispense Refill   azithromycin (ZITHROMAX Z-PAK) 250 MG tablet Take 4 tablets ( 1000 mg ) po at once. 4 tablet 2   fluconazole (DIFLUCAN) 150 MG tablet Take one tab PO then repeat dose in 3 days if symptoms persist. 2 tablet 0   metroNIDAZOLE (FLAGYL) 500 MG tablet Take 1 tablet (500 mg total) by mouth 2 (two) times daily.  (Patient not taking: Reported on 06/16/2023) 14 tablet 0   metroNIDAZOLE (FLAGYL) 500 MG tablet Take 1 tablet (500 mg total) by mouth 2 (two) times daily. (Patient not taking: Reported on 06/16/2023) 14 tablet 0   metroNIDAZOLE (FLAGYL) 500 MG tablet Take 1 tablet (500 mg total) by mouth 2 (two) times daily. 14 tablet 2   nitrofurantoin, macrocrystal-monohydrate, (MACROBID) 100 MG capsule Take 1 capsule by mouth every 12 (twelve) hours. (Patient not taking: Reported on 06/16/2023)     ondansetron (ZOFRAN) 4 MG tablet Take 1 tablet (4 mg total) by mouth every 8 (eight) hours as needed for nausea or vomiting. (Patient not taking: Reported on 06/16/2023) 20 tablet 0   Prenatal Vit-DSS-Fe Cbn-FA (PRENATAL AD PO)  (Patient not taking: Reported on 06/16/2023)     terconazole (TERAZOL 7) 0.4 % vaginal cream Place 1 applicator vaginally at bedtime. (Patient not taking: Reported on 06/16/2023) 45 g 0   valACYclovir (VALTREX) 1000 MG tablet Take 1 tablet (1,000 mg total) by mouth 2 (two) times daily. Take for ten days. (Patient not taking: Reported on 06/16/2023) 20 tablet 3   valACYclovir (VALTREX) 1000 MG tablet Take 1 tablet (1,000 mg total) by mouth daily.  30 tablet 11   valACYclovir (VALTREX) 500 MG tablet Take 1 tablet (500 mg total) by mouth 2 (two) times daily. (Patient not taking: Reported on 06/16/2023) 30 tablet 0   No current facility-administered medications for this visit.    Review of Systems Review of Systems  Blood pressure 123/86, pulse 73, height 5\' 3"  (1.6 m), weight 176 lb (79.8 kg), last menstrual period 06/23/2023.  Physical Exam Physical Exam  Data Reviewed pap  Assessment    Procedure Details  The risks and benefits of the procedure and Written informed consent obtained. Patient given informed consent, signed copy in the chart, time out was performed.  Placed in lithotomy position. Cervix viewed with speculum and colposcope after application of acetic acid.   Colposcopy adequate?   yes Acetowhite lesions?mild Punctation?no Mosaicism?  no Abnormal vasculature?  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  She will return in 2 weeks for results.  Scheryl Darter, MD Speculum placed in vagina and excellent visualization of cervix achieved, cervix swabbed x 3 with acetic acid solution.  Specimens: ECC and Bx 12 and 6  Complications: none.     Plan    Specimens labelled and sent to Pathology.       Scheryl Darter 07/08/2023, 2:45 PM

## 2023-07-08 NOTE — Progress Notes (Signed)
 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  UPT Negative

## 2023-07-10 LAB — SURGICAL PATHOLOGY

## 2023-07-21 ENCOUNTER — Ambulatory Visit

## 2023-07-21 ENCOUNTER — Ambulatory Visit: Admitting: *Deleted

## 2023-07-21 ENCOUNTER — Encounter: Payer: Self-pay | Admitting: Podiatry

## 2023-07-21 ENCOUNTER — Ambulatory Visit (INDEPENDENT_AMBULATORY_CARE_PROVIDER_SITE_OTHER): Admitting: Podiatry

## 2023-07-21 ENCOUNTER — Other Ambulatory Visit (HOSPITAL_COMMUNITY)
Admission: RE | Admit: 2023-07-21 | Discharge: 2023-07-21 | Disposition: A | Discharge status: Home / Self Care | Source: Ambulatory Visit | Attending: Obstetrics and Gynecology | Admitting: Obstetrics and Gynecology

## 2023-07-21 ENCOUNTER — Encounter: Payer: Self-pay | Admitting: Obstetrics & Gynecology

## 2023-07-21 VITALS — BP 113/74 | HR 60

## 2023-07-21 DIAGNOSIS — N898 Other specified noninflammatory disorders of vagina: Secondary | ICD-10-CM | POA: Insufficient documentation

## 2023-07-21 DIAGNOSIS — M21612 Bunion of left foot: Secondary | ICD-10-CM

## 2023-07-21 DIAGNOSIS — M79672 Pain in left foot: Secondary | ICD-10-CM

## 2023-07-21 DIAGNOSIS — M21619 Bunion of unspecified foot: Secondary | ICD-10-CM

## 2023-07-21 NOTE — Progress Notes (Signed)
 SUBJECTIVE:  28 y.o. female complains of clear, malodorous vaginal discharge for 1 week(s). Denies abnormal vaginal bleeding or significant pelvic pain or fever. No UTI symptoms. Denies history of known exposure to STD.  Patient's last menstrual period was 07/20/2023 (exact date).  OBJECTIVE:  She appears well, afebrile. Urine dipstick: not done.  ASSESSMENT:  Vaginal Discharge  Vaginal Odor   PLAN:  GC, chlamydia, trichomonas, BVAG, CVAG probe sent to lab. Treatment: To be determined once lab results are received ROV prn if symptoms persist or worsen.  Pt reports vaginal odor and feels like "something is off" since colposcopy procedure 07/08/23.

## 2023-07-21 NOTE — Patient Instructions (Signed)
 You can also use VOLTAREN GEL on the bunion  --  Bunion: What to Know A bunion, or hallux valgus, is a bump that forms slowly on the inner side of your big toe joint. It happens when your big toe turns toward your second toe. Bunions may be small at first but get bigger over time. They can make walking painful. What are the causes? A bunion may be caused by: Wearing narrow or pointed shoes that force your big toe to press against the other toes. Problems with how your foot is shaped. Changes in your foot caused by some diseases or conditions. A foot injury. What increases the risk? You're more likely to get a bunion if: You wear shoes that squeeze your toes. You have certain diseases, such as: Rheumatoid arthritis. Cerebral palsy. Someone in your family gets bunions too. You have flat feet or low arches. You do things that put a lot of pressure on your feet, such as ballet. What are the signs or symptoms? The main symptom is a bump on the inner side of your big toe. You may also have: Pain. Redness and swelling around your big toe. Thick or hard skin on your big toe or between your toes. Stiffness or loss of movement in your big toe. Trouble walking. How is this diagnosed? A bunion may be diagnosed based on your symptoms, medical history, and activities.  You may also have tests, such as an X-ray. This helps your health care provider see the bones in your foot and look for damage to your joint. How is this treated? Treatment can help with symptoms and can stop the bunion from getting worse. What you need to do may depend on how bad your symptoms are. You may need to: Wear shoes that have a wide toe box. Use bunion pads to cushion your toes. Tape your toes together. Place an insert called an orthotic device in your shoe. This can help take pressure off your toe joint. Take medicine to help with pain and swelling. Put ice or heat on your foot. Do stretching exercises. Have  surgery. You may need this if the bunion is causing very bad symptoms. Follow these instructions at home: Managing pain, stiffness, and swelling     Use ice or an ice pack as told. Place a towel between your skin and the ice. Leave the ice on for 20 minutes, 2-3 times a day. Use heat as told. Use the heat source that your provider recommends, such as a moist heat pack or a heating pad. Do this as often as told. Place a towel between your skin and the heat source. Leave the heat on for 20-30 minutes. If your skin turns red, take off the ice or heat right away to prevent skin damage. The risk of damage is higher if you can't feel pain, heat, or cold. General instructions Exercise as told. Support your toe joint as told with: The right footwear. Shoe padding. Taping. Wear shoes that have a wide toe box. Avoid wearing tight shoes or shoes with high heels. Take your medicines only as told. Do not smoke, vape, or use nicotine or tobacco. Keep all follow-up visits. Your provider will check if the treatments are working. Contact a health care provider if: Your symptoms get worse. Your symptoms don't get better in 2 weeks. Get help right away if: You have very bad pain and trouble walking. This information is not intended to replace advice given to you by your health care  provider. Make sure you discuss any questions you have with your health care provider. Document Revised: 10/17/2022 Document Reviewed: 10/17/2022 Elsevier Patient Education  2024 [employer].

## 2023-07-21 NOTE — Progress Notes (Signed)
 CIN 1, repeat pap in 12 months

## 2023-07-22 ENCOUNTER — Encounter: Payer: Self-pay | Admitting: Obstetrics and Gynecology

## 2023-07-22 LAB — CERVICOVAGINAL ANCILLARY ONLY
Bacterial Vaginitis (gardnerella): NEGATIVE
Candida Glabrata: NEGATIVE
Candida Vaginitis: NEGATIVE
Chlamydia: NEGATIVE
Comment: NEGATIVE
Comment: NEGATIVE
Comment: NEGATIVE
Comment: NEGATIVE
Comment: NEGATIVE
Comment: NORMAL
Neisseria Gonorrhea: NEGATIVE
Trichomonas: NEGATIVE

## 2023-07-24 NOTE — Progress Notes (Signed)
 Subjective:   Patient ID: Anita Sims, female   DOB: 28 y.o.   MRN: [id number]   HPI Chief Complaint  Patient presents with   Foot Pain    RM#13 Left foot bunion pain worsening.   28 year old female presents the office today with concerns of left foot bunion pain.  She was last seen in the office in 2022 for the same issue.  At the time she had grandkids and she was not able to proceed with surgery because she wants to reevaluate this.  She does not report any recent injuries.  The areas tender with pressure in shoes.  She tried shoe modifications.   Review of Systems  All other systems reviewed and are negative.  Past Medical History:  Diagnosis Date   Anemia    hx of, but not during pregnancy   Anxiety    "was on medication long time ago"; pt states   Bacterial vaginosis    Bipolar 1 disorder (HCC)    "was on medication a long time ago" pt states   Chlamydia    Genital herpes    Gonorrhea    Substance abuse (HCC)     Past Surgical History:  Procedure Laterality Date   CESAREAN SECTION  11/26/2019   Procedure: CESAREAN SECTION;  Surgeon: [place] Bing, MD;  Location: [facility];  Service: Obstetrics;;   MOUTH SURGERY     TUBAL LIGATION       Current Outpatient Medications:    azithromycin (ZITHROMAX Z-PAK) 250 MG tablet, Take 4 tablets ( 1000 mg ) po at once. (Patient not taking: Reported on 07/21/2023), Disp: 4 tablet, Rfl: 2   fluconazole (DIFLUCAN) 150 MG tablet, Take one tab PO then repeat dose in 3 days if symptoms persist. (Patient not taking: Reported on 07/21/2023), Disp: 2 tablet, Rfl: 0   metroNIDAZOLE (FLAGYL) 500 MG tablet, Take 1 tablet (500 mg total) by mouth 2 (two) times daily. (Patient not taking: Reported on 06/16/2023), Disp: 14 tablet, Rfl: 0   metroNIDAZOLE (FLAGYL) 500 MG tablet, Take 1 tablet (500 mg total) by mouth 2 (two) times daily. (Patient not taking: Reported on 07/21/2023), Disp: 14 tablet, Rfl: 0   metroNIDAZOLE (FLAGYL) 500 MG tablet,  Take 1 tablet (500 mg total) by mouth 2 (two) times daily. (Patient not taking: Reported on 07/21/2023), Disp: 14 tablet, Rfl: 2   nitrofurantoin, macrocrystal-monohydrate, (MACROBID) 100 MG capsule, Take 1 capsule by mouth every 12 (twelve) hours. (Patient not taking: Reported on 06/16/2023), Disp: , Rfl:    ondansetron (ZOFRAN) 4 MG tablet, Take 1 tablet (4 mg total) by mouth every 8 (eight) hours as needed for nausea or vomiting. (Patient not taking: Reported on 01/21/2021), Disp: 20 tablet, Rfl: 0   Prenatal Vit-DSS-Fe Cbn-FA (PRENATAL AD PO), , Disp: , Rfl:    terconazole (TERAZOL 7) 0.4 % vaginal cream, Place 1 applicator vaginally at bedtime. (Patient not taking: Reported on 07/21/2023), Disp: 45 g, Rfl: 0   valACYclovir (VALTREX) 1000 MG tablet, Take 1 tablet (1,000 mg total) by mouth 2 (two) times daily. Take for ten days. (Patient not taking: Reported on 07/21/2023), Disp: 20 tablet, Rfl: 3   valACYclovir (VALTREX) 1000 MG tablet, Take 1 tablet (1,000 mg total) by mouth daily. (Patient not taking: Reported on 07/21/2023), Disp: 30 tablet, Rfl: 11   valACYclovir (VALTREX) 500 MG tablet, Take 1 tablet (500 mg total) by mouth 2 (two) times daily. (Patient not taking: Reported on 07/21/2023), Disp: 30 tablet, Rfl: 0  No  Known Allergies        Objective:  Physical Exam  General: AAO x3, NAD  Dermatological: Skin is warm, dry and supple bilateral. There are no open sores, no preulcerative lesions, no rash or signs of infection present.  Vascular: Dorsalis Pedis artery and Posterior Tibial artery pedal pulses are 2/4 bilateral with immedate capillary fill time.  There is no pain with calf compression, swelling, warmth, erythema.   Neruologic: Grossly intact via light touch bilateral.   Musculoskeletal: Significant bunion present on the left foot.  Tenderness palpation over the medial eminence of the first metatarsal head.  There is no crepitation with any change of motion.  There is no erythema or  warmth.  MMT 5/5.  Gait: Unassisted, Nonantalgic.       Assessment:   Bunion deformity left foot     Plan:  -Treatment options discussed including all alternatives, risks, and complications -Etiology of symptoms were discussed - X-rays were obtained reviewed.  Multiple views were obtained.  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 -We discussed both conservative as well as surgical options.  Surgically discussed Lapidus bunionectomy with possible Akin.  Discussed the procedure as well as postoperative course.  She will consider her options.  She also discussed conservative options which we discussed include shoe modifications, offloading, padding as well as topical creams to use as needed.  She will consider surgery.  Vivi Barrack DPM

## 2023-09-02 ENCOUNTER — Ambulatory Visit

## 2023-09-02 ENCOUNTER — Other Ambulatory Visit (HOSPITAL_COMMUNITY)
Admission: RE | Admit: 2023-09-02 | Discharge: 2023-09-02 | Disposition: A | Discharge status: Home / Self Care | Source: Ambulatory Visit | Attending: Obstetrics and Gynecology | Admitting: Obstetrics and Gynecology

## 2023-09-02 VITALS — BP 121/80 | HR 77 | Wt 185.0 lb

## 2023-09-02 DIAGNOSIS — N898 Other specified noninflammatory disorders of vagina: Secondary | ICD-10-CM | POA: Insufficient documentation

## 2023-09-02 NOTE — Progress Notes (Signed)
..  SUBJECTIVE:  28 y.o. female complains of white vaginal discharge for 1.5 week(s). Denies abnormal vaginal bleeding or significant pelvic pain or fever. No UTI symptoms. Denies history of known exposure to STD. Pt states that she recently used 7-day OTC yeast treatment, last used around 1-2 days ago.  No LMP recorded. (Menstrual status: Bilateral Tubal Ligation).  OBJECTIVE:  She appears well, afebrile. Urine dipstick: not done.  ASSESSMENT:  Vaginal Discharge  Vaginal Irritation   PLAN:  GC, chlamydia, trichomonas, BVAG, CVAG probe sent to lab. Treatment: To be determined once lab results are received ROV prn if symptoms persist or worsen. Advised pt that results could be altered by OTC treatment, pt voiced understanding.

## 2023-09-03 LAB — CERVICOVAGINAL ANCILLARY ONLY
Bacterial Vaginitis (gardnerella): NEGATIVE
Candida Glabrata: NEGATIVE
Candida Vaginitis: NEGATIVE
Chlamydia: NEGATIVE
Comment: NEGATIVE
Comment: NEGATIVE
Comment: NEGATIVE
Comment: NEGATIVE
Comment: NEGATIVE
Comment: NORMAL
Neisseria Gonorrhea: NEGATIVE
Trichomonas: NEGATIVE

## 2023-09-04 ENCOUNTER — Ambulatory Visit: Payer: Self-pay | Admitting: Obstetrics and Gynecology

## 2023-11-24 ENCOUNTER — Ambulatory Visit

## 2023-12-23 ENCOUNTER — Other Ambulatory Visit (HOSPITAL_COMMUNITY)
Admission: RE | Admit: 2023-12-23 | Discharge: 2023-12-23 | Disposition: A | Discharge status: Home / Self Care | Source: Ambulatory Visit | Attending: Obstetrics and Gynecology | Admitting: Obstetrics and Gynecology

## 2023-12-23 ENCOUNTER — Ambulatory Visit (INDEPENDENT_AMBULATORY_CARE_PROVIDER_SITE_OTHER)

## 2023-12-23 VITALS — BP 134/89 | HR 76

## 2023-12-23 DIAGNOSIS — N898 Other specified noninflammatory disorders of vagina: Secondary | ICD-10-CM | POA: Diagnosis not present

## 2023-12-23 DIAGNOSIS — R350 Frequency of micturition: Secondary | ICD-10-CM

## 2023-12-23 LAB — POCT URINALYSIS DIPSTICK
Bilirubin, UA: NEGATIVE
Blood, UA: NEGATIVE
Glucose, UA: NEGATIVE
Leukocytes, UA: NEGATIVE
Nitrite, UA: NEGATIVE
Odor: NEGATIVE
Protein, UA: NEGATIVE
Spec Grav, UA: 1.015 (ref 1.010–1.025)
Urobilinogen, UA: 0.2 U/dL
pH, UA: 6 (ref 5.0–8.0)

## 2023-12-23 NOTE — Progress Notes (Signed)
..  SUBJECTIVE:  28 y.o. female complains of white and yellow vaginal discharge for a few week(s). Denies abnormal vaginal bleeding or significant pelvic pain or fever. No UTI symptoms. Denies history of known exposure to STD.  No LMP recorded. (Menstrual status: Bilateral Tubal Ligation).  OBJECTIVE:  She appears well, afebrile. Urine dipstick: positive for ketones.  ASSESSMENT:  Vaginal Discharge  Vaginal Odor   PLAN:  GC, chlamydia, trichomonas, BVAG, CVAG probe & HIV, RPR, Hep B, & Hep C sent to lab. Treatment: To be determined once lab results are received ROV prn if symptoms persist or worsen.

## 2023-12-24 ENCOUNTER — Ambulatory Visit: Payer: Self-pay | Admitting: Obstetrics and Gynecology

## 2023-12-24 DIAGNOSIS — B3731 Acute candidiasis of vulva and vagina: Secondary | ICD-10-CM

## 2023-12-24 DIAGNOSIS — B952 Enterococcus as the cause of diseases classified elsewhere: Secondary | ICD-10-CM

## 2023-12-24 LAB — HEPATITIS B SURFACE ANTIGEN: Hepatitis B Surface Ag: NEGATIVE

## 2023-12-24 LAB — CERVICOVAGINAL ANCILLARY ONLY
Bacterial Vaginitis (gardnerella): NEGATIVE
Candida Glabrata: NEGATIVE
Candida Vaginitis: POSITIVE — AB
Chlamydia: NEGATIVE
Comment: NEGATIVE
Comment: NEGATIVE
Comment: NEGATIVE
Comment: NEGATIVE
Comment: NEGATIVE
Comment: NORMAL
Neisseria Gonorrhea: NEGATIVE
Trichomonas: NEGATIVE

## 2023-12-24 LAB — HIV ANTIBODY (ROUTINE TESTING W REFLEX): HIV Screen 4th Generation wRfx: NONREACTIVE

## 2023-12-24 LAB — RPR: RPR Ser Ql: NONREACTIVE

## 2023-12-24 LAB — HEPATITIS C ANTIBODY: Hep C Virus Ab: NONREACTIVE

## 2023-12-24 MED ORDER — FLUCONAZOLE 150 MG PO TABS: 150.0000 mg | ORAL_TABLET | Freq: Once | ORAL | 0 refills | Status: AC

## 2023-12-29 LAB — URINE CULTURE

## 2023-12-29 MED ORDER — NITROFURANTOIN MONOHYD MACRO 100 MG PO CAPS: 100.0000 mg | ORAL_CAPSULE | Freq: Two times a day (BID) | ORAL | 0 refills | Status: AC

## 2024-03-09 ENCOUNTER — Inpatient Hospital Stay (HOSPITAL_COMMUNITY)
Admission: AD | Admit: 2024-03-09 | Discharge: 2024-03-10 | Disposition: A | Discharge status: Home / Self Care | Attending: Obstetrics & Gynecology | Admitting: Obstetrics & Gynecology

## 2024-03-09 DIAGNOSIS — Z9851 Tubal ligation status: Secondary | ICD-10-CM | POA: Insufficient documentation

## 2024-03-09 DIAGNOSIS — Z3202 Encounter for pregnancy test, result negative: Secondary | ICD-10-CM | POA: Insufficient documentation

## 2024-03-09 DIAGNOSIS — R109 Unspecified abdominal pain: Secondary | ICD-10-CM | POA: Insufficient documentation

## 2024-03-10 ENCOUNTER — Ambulatory Visit: Payer: Self-pay

## 2024-03-10 DIAGNOSIS — Z9851 Tubal ligation status: Secondary | ICD-10-CM | POA: Diagnosis not present

## 2024-03-10 DIAGNOSIS — R109 Unspecified abdominal pain: Secondary | ICD-10-CM | POA: Diagnosis present

## 2024-03-10 DIAGNOSIS — Z3202 Encounter for pregnancy test, result negative: Secondary | ICD-10-CM | POA: Diagnosis not present

## 2024-03-10 LAB — URINALYSIS, ROUTINE W REFLEX MICROSCOPIC
Bilirubin Urine: NEGATIVE
Glucose, UA: NEGATIVE mg/dL
Hgb urine dipstick: NEGATIVE
Ketones, ur: NEGATIVE mg/dL
Leukocytes,Ua: NEGATIVE
Nitrite: NEGATIVE
Protein, ur: NEGATIVE mg/dL
Specific Gravity, Urine: 1.021 (ref 1.005–1.030)
pH: 7 (ref 5.0–8.0)

## 2024-03-10 LAB — POCT PREGNANCY, URINE: Preg Test, Ur: NEGATIVE

## 2024-03-10 LAB — HCG, QUANTITATIVE, PREGNANCY: hCG, Beta Chain, Quant, S: <1 m[IU]/mL (ref <5)

## 2024-03-10 NOTE — Progress Notes (Signed)
 Harlene Duncans CNM in earlier to talk with pt and discuss test results and plan of care. Pt will have BHCG drawn and then may leave. Pt does not want d/c papers.

## 2024-03-10 NOTE — MAU Note (Signed)
 Anita Sims is a 28 y.o. at Unknown here in MAU reporting having a BTL in 2021. Period has been irregular. She took upt and it was positive and took another one and it was negative. For a wk has had a cramp in LLQ. No vag bleeding.   LMP: 01/26/24 Onset of complaint: 1 week Pain score: 6 Vitals:   03/10/24 0008 03/10/24 0010  BP:  125/83  Pulse: 64   Resp: 17   Temp: 98.4 F (36.9 C)   SpO2: 100%      FHT: na  Lab orders placed from triage: upt and u/a

## 2024-03-10 NOTE — MAU Provider Note (Signed)
 Event Date/Time   First Provider Initiated Contact with Patient 03/10/24 0041      S Ms. Anita Sims is a 28 y.o. [phone] patient who presents to MAU today with complaint of abdominal pain.  She states pain started one week ago.  She states 3 days ago it became constant.  She states she took a UPT 3 days ago that was positive, but repeat was negative.  Patient states she is concerned for ectopic pregnancy as she had a BTL in 2021.  O BP 125/83   Pulse 64   Temp 98.4 F (36.9 C)   Resp 17   Ht 5' 5 (1.651 m)   Wt 102.3 kg   SpO2 100%   BMI 37.53 kg/m  Physical Exam Constitutional:      Appearance: Normal appearance.  HENT:     Head: Normocephalic and atraumatic.  Eyes:     Conjunctiva/sclera: Conjunctivae normal.  Cardiovascular:     Rate and Rhythm: Normal rate.  Pulmonary:     Effort: Pulmonary effort is normal. No respiratory distress.  Musculoskeletal:        General: Normal range of motion.  Neurological:     Mental Status: She is alert.    Results for orders placed or performed during the hospital encounter of 03/09/24 (from the past 24 hours)  Urinalysis, Routine w reflex microscopic -Urine, Clean Catch     Status: None   Collection Time: 03/10/24 12:05 AM  Result Value Ref Range   Color, Urine YELLOW YELLOW   APPearance CLEAR CLEAR   Specific Gravity, Urine 1.021 1.005 - 1.030   pH 7.0 5.0 - 8.0   Glucose, UA NEGATIVE NEGATIVE mg/dL   Hgb urine dipstick NEGATIVE NEGATIVE   Bilirubin Urine NEGATIVE NEGATIVE   Ketones, ur NEGATIVE NEGATIVE mg/dL   Protein, ur NEGATIVE NEGATIVE mg/dL   Nitrite NEGATIVE NEGATIVE   Leukocytes,Ua NEGATIVE NEGATIVE  Pregnancy, urine POC     Status: None   Collection Time: 03/10/24 12:28 AM  Result Value Ref Range   Preg Test, Ur NEGATIVE NEGATIVE  hCG, quantitative, pregnancy     Status: None   Collection Time: 03/10/24 12:56 AM  Result Value Ref Range   hCG, Beta Chain, Quant, S <1 <5 mIU/mL    A Medical  screening exam complete Negative UPT   P -Informed of negative UPT. -However considering h/o and previous positive will perform hCG. -Discussed sending results and follow up via mychart. Patient agreeable. -Instructed to follow up with primary office if hCG negative and symptoms persists.  -Discharge from MAU in stable condition.  Synthia Raisin, CNM 03/10/2024 12:41 AM

## 2024-03-15 ENCOUNTER — Ambulatory Visit

## 2024-05-17 ENCOUNTER — Ambulatory Visit: Payer: Self-pay

## 2024-05-18 ENCOUNTER — Ambulatory Visit
